# Patient Record
Sex: Female | Born: 1995 | Hispanic: No | Marital: Single | Smoking: Never smoker
Health system: Southern US, Community
[De-identification: ages and names within clinical notes are randomized; demographics above are authoritative.]

## PROBLEM LIST (undated history)

## (undated) ENCOUNTER — Ambulatory Visit

## (undated) DIAGNOSIS — R51 Headache: Secondary | ICD-10-CM

## (undated) DIAGNOSIS — N946 Dysmenorrhea, unspecified: Secondary | ICD-10-CM

## (undated) DIAGNOSIS — J45909 Unspecified asthma, uncomplicated: Secondary | ICD-10-CM

## (undated) DIAGNOSIS — J189 Pneumonia, unspecified organism: Secondary | ICD-10-CM

## (undated) DIAGNOSIS — J0391 Acute recurrent tonsillitis, unspecified: Secondary | ICD-10-CM

## (undated) DIAGNOSIS — B379 Candidiasis, unspecified: Secondary | ICD-10-CM

## (undated) DIAGNOSIS — E041 Nontoxic single thyroid nodule: Secondary | ICD-10-CM

## (undated) DIAGNOSIS — D649 Anemia, unspecified: Secondary | ICD-10-CM

## (undated) DIAGNOSIS — N760 Acute vaginitis: Secondary | ICD-10-CM

## (undated) DIAGNOSIS — R519 Headache, unspecified: Secondary | ICD-10-CM

## (undated) HISTORY — PX: WISDOM TOOTH EXTRACTION: SHX21

## (undated) HISTORY — DX: Acute recurrent tonsillitis, unspecified: J03.91

## (undated) HISTORY — DX: Headache: R51

## (undated) HISTORY — DX: Headache, unspecified: R51.9

## (undated) HISTORY — DX: Anemia, unspecified: D64.9

## (undated) HISTORY — DX: Pneumonia, unspecified organism: J18.9

## (undated) HISTORY — DX: Unspecified asthma, uncomplicated: J45.909

## (undated) HISTORY — PX: HIP SURGERY: SHX245

## (undated) HISTORY — DX: Candidiasis, unspecified: B37.9

## (undated) HISTORY — DX: Dysmenorrhea, unspecified: N94.6

---
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
## 2015-07-15 ENCOUNTER — Other Ambulatory Visit: Payer: Self-pay | Admitting: Family Medicine

## 2015-07-15 DIAGNOSIS — R51 Headache: Principal | ICD-10-CM

## 2015-07-15 DIAGNOSIS — R519 Headache, unspecified: Secondary | ICD-10-CM

## 2015-07-26 ENCOUNTER — Ambulatory Visit
Admission: RE | Admit: 2015-07-26 | Discharge: 2015-07-26 | Disposition: A | Discharge status: Home / Self Care | Payer: BC Managed Care – PPO | Source: Ambulatory Visit | Attending: Family Medicine | Admitting: Family Medicine

## 2015-07-26 DIAGNOSIS — R51 Headache: Principal | ICD-10-CM

## 2015-07-26 DIAGNOSIS — R519 Headache, unspecified: Secondary | ICD-10-CM

## 2015-08-15 ENCOUNTER — Encounter: Payer: Self-pay | Admitting: *Deleted

## 2015-08-15 ENCOUNTER — Ambulatory Visit (INDEPENDENT_AMBULATORY_CARE_PROVIDER_SITE_OTHER): Payer: BC Managed Care – PPO | Admitting: Neurology

## 2015-08-15 VITALS — BP 116/61 | HR 84 | Ht 61.0 in | Wt 127.8 lb

## 2015-08-15 DIAGNOSIS — G43111 Migraine with aura, intractable, with status migrainosus: Secondary | ICD-10-CM | POA: Diagnosis not present

## 2015-08-15 DIAGNOSIS — G43011 Migraine without aura, intractable, with status migrainosus: Secondary | ICD-10-CM | POA: Diagnosis not present

## 2015-08-15 NOTE — Progress Notes (Signed)
[facility]    Provider:  Dr Jaynee Eagles Referring Provider: [name], MD Primary Care Physician:  Lilian Coma, MD  CC:  headache  HPI:  Alisha Reynolds is a 20 y.o. female here as a referral from Dr. Stephanie Acre for headache. It has not gone away for a month, continuous. She was at home when it started, she had to sit in a dark room with a blanket over her head. She has never had migraines. She went to the ED and she got a shot in the but and flexril which didn't help. She went to see Dr. Stephanie Acre and she was give O2 and blood and a CT was completed. The Ct was normal except obstruction of the right nares which was ruled out by her ENT there was nothing on exam it may have been artifact on the CT.  The headaches are worse in the light. On avergage they are a 6/10. It can get really bad up to a 10. Sound doesn't bother her. She has nausea but no vomiting. The only thing that helped is fioricet but didn't work very well. It is on the left, throbbing and pressure. Feels better She took prednisone but not the headache for 6 days. Right she is at a 5 and the max is a 10. No vision changes, she has some tingling and numbness in the fingers. No weakness, no hearing changes, no fevers, no neck pain. She sleeps well. No snoring. She tried ibuprofen, tylenol, goodies never helped.   Her uncle on her dad's side has severe migraine.Mother has had migraines.   Reviewed notes, labs and imaging from outside physicians, which showed:  Ct showed No acute intracranial abnormalities including mass lesion or mass effect, hydrocephalus, extra-axial fluid collection, midline shift, hemorrhage, or acute infarction, large ischemic events (personally reviewed images)  Labs: pregnancy urine negative  Review of Systems: Patient complains of symptoms per HPI as well as the following symptoms: fatigue, blurred vision, eye pain, lymh nodes, headaches, numbness, birth marks. Pertinent negatives per HPI.  All others negative.   Social History   Social History  . Marital Status: Single    Spouse Name: N/A  . Number of Children: 0  . Years of Education: 13   Occupational History  .      [employer]   Social History Main Topics  . Smoking status: Never Smoker   . Smokeless tobacco: Never Used  . Alcohol Use: No  . Drug Use: No  . Sexual Activity: Yes    Birth Control/ Protection: Other-see comments, Implant   Other Topics Concern  . Not on file   Social History Narrative   Lives at home with mom, dad, younger brother   Caffeine use- 1 cup daily    Family History  Problem Relation Age of Onset  . Autism spectrum disorder      Younger half-Brother  . Cancer - Colon Maternal Grandmother   . Migraines Mother     Past Medical History  Diagnosis Date  . Asthma   . Yeast infection   . Anemia 10/30/11  . Dysmenorrhea 10/30/11  . Pneumonia 10/30/11  . Recurrent tonsillitis   . Headache     Past Surgical History  Procedure Laterality Date  . Tonsillectomy  09/28/14    Current Outpatient Prescriptions  Medication Sig Dispense Refill  . etonogestrel (NEXPLANON) 68 MG IMPL implant Inject 1 each into the skin once.     No current facility-administered medications for this visit.  Allergies as of 08/15/2015  . (No Known Allergies)    Vitals: BP 116/61 mmHg  Pulse 84  Ht 5\' 1"  (1.549 m)  Wt 127 lb 12.8 oz (57.97 kg)  BMI 24.16 kg/m2 Last Weight:  Wt Readings from Last 1 Encounters:  08/15/15 127 lb 12.8 oz (57.97 kg) (50 %*, Z = 0.01)   * Growth percentiles are based on CDC 2-20 Years data.   Last Height:   Ht Readings from Last 1 Encounters:  08/15/15 5\' 1"  (1.549 m) (10 %*, Z = -1.29)   * Growth percentiles are based on CDC 2-20 Years data.   Physical exam: Exam: Gen: NAD, conversant                   CV: RRR, no MRG. No Carotid Bruits. No peripheral edema, warm, nontender Eyes: Conjunctivae clear without exudates or hemorrhage  Neuro: Detailed  Neurologic Exam  Speech:    Speech is normal; fluent and spontaneous with normal comprehension.  Cognition:    The patient is oriented to person, place, and time;     recent and remote memory intact;     language fluent;     normal attention, concentration,     fund of knowledge Cranial Nerves:    The pupils are equal, round, and reactive to light. The fundi are normal and spontaneous venous pulsations are present. Visual fields are full to finger confrontation. Extraocular movements are intact. Trigeminal sensation is intact and the muscles of mastication are normal. The face is symmetric. The palate elevates in the midline. Hearing intact. Voice is normal. Shoulder shrug is normal. The tongue has normal motion without fasciculations.   Coordination:    Normal finger to nose and heel to shin. Normal rapid alternating movements.   Gait:    Heel-toe and tandem gait are normal.   Motor Observation:    No asymmetry, no atrophy, and no involuntary movements noted. Tone:    Normal muscle tone.    Posture:    Posture is normal. normal erect    Strength:    Strength is V/V in the upper and lower limbs.      Sensation: intact to LT     Reflex Exam:  DTR's:    Deep tendon reflexes in the upper and lower extremities are normal bilaterally.   Toes:    The toes are downgoing bilaterally.   Clonus:    Clonus is absent.       Assessment/Plan:  lovely 20 year old with what appears to be a migraine type headache continously for a month. CT of the head unremarkable. Neuro exam normal. Will order a migraine cocktail and try to improve the headache or make it entirely go away. If it persists, may need to start a preventative such as Nortriptyline. Problem list included Depression, use caution with Topamax as can make depression worse.   Sarina Ill, MD  [facility] [address] [place], [place] [address]  Phone [phone] Fax [phone]

## 2015-08-19 ENCOUNTER — Other Ambulatory Visit: Payer: Self-pay | Admitting: Neurology

## 2015-08-19 ENCOUNTER — Telehealth: Payer: Self-pay | Admitting: Neurology

## 2015-08-19 MED ORDER — TOPIRAMATE 50 MG PO TABS: 50.0000 mg | ORAL_TABLET | Freq: Every day | ORAL | Status: DC

## 2015-08-19 NOTE — Telephone Encounter (Signed)
Patient called to advise Cocktail IV she received on Thursday 08/15/15 has not worked, headaches have not gone away. Please call [phone].

## 2015-08-19 NOTE — Telephone Encounter (Signed)
Per Dr Jaynee Eagles- she wants to start pt on preventative medication. She is going to review pt chart and I will call her back once she decides what med to start her on. Call pt to relay message. Pt also stated her wisdom teeth are coming in. Two bottom ones are coming in, top ones have not yet. Is planning on having teeth removed. Wanted to take care of headaches first. She is not sure if this is correlated. Headaches started about a month ago. Her wisdom teeth just started coming in. Advised I will speak to Dr Jaynee Eagles and call her back to advise. She verbalized understanding.

## 2015-08-19 NOTE — Telephone Encounter (Signed)
Calling in topamax 50mg  at night. Start with 1/2 pill 25 then in 2 weeks can increase. Discuss side effects. Don't get pregnant. Serious side effects can inclue: Abdominal or stomach pain  loss of appetite  mood changes  weight loss Rare  ringing or buzzing in the ears  skin rash or itching  swelling  trouble breathing Common side effects of Topamax include:  tiredness,  drowsiness,  dizziness,  nervousness,  numbness or tingly feeling,  weight loss,  loss of appetite,  bad taste in your mouth,  confusion,  slowed thinking,  trouble concentrating or paying attention,  memory problems,

## 2015-08-20 NOTE — Telephone Encounter (Signed)
LVM for pt to call back regarding Dr Jaynee Eagles message below. Gave [facility] phone number and hours.

## 2015-08-21 NOTE — Telephone Encounter (Signed)
Patient is returning your call and can be reached at [phone].

## 2015-08-21 NOTE — Telephone Encounter (Signed)
Called pt back. Relayed information below per Dr Jaynee Eagles. She started medication last night. She took 1/2 tablet and knows to increase to 50mg  in a couple weeks. Told her to call me if she experiences any SE. She verbalized understanding.

## 2015-10-24 ENCOUNTER — Emergency Department (HOSPITAL_COMMUNITY)
Admission: EM | Admit: 2015-10-24 | Discharge: 2015-10-24 | Disposition: A | Discharge status: Home / Self Care | Payer: BC Managed Care – PPO | Attending: Emergency Medicine | Admitting: Emergency Medicine

## 2015-10-24 ENCOUNTER — Encounter (HOSPITAL_COMMUNITY): Payer: Self-pay | Admitting: Emergency Medicine

## 2015-10-24 DIAGNOSIS — Z8701 Personal history of pneumonia (recurrent): Secondary | ICD-10-CM | POA: Diagnosis not present

## 2015-10-24 DIAGNOSIS — Z8619 Personal history of other infectious and parasitic diseases: Secondary | ICD-10-CM | POA: Insufficient documentation

## 2015-10-24 DIAGNOSIS — Z862 Personal history of diseases of the blood and blood-forming organs and certain disorders involving the immune mechanism: Secondary | ICD-10-CM | POA: Insufficient documentation

## 2015-10-24 DIAGNOSIS — J45909 Unspecified asthma, uncomplicated: Secondary | ICD-10-CM | POA: Insufficient documentation

## 2015-10-24 DIAGNOSIS — H9202 Otalgia, left ear: Secondary | ICD-10-CM | POA: Diagnosis present

## 2015-10-24 DIAGNOSIS — H6122 Impacted cerumen, left ear: Secondary | ICD-10-CM | POA: Diagnosis not present

## 2015-10-24 DIAGNOSIS — J029 Acute pharyngitis, unspecified: Secondary | ICD-10-CM | POA: Diagnosis not present

## 2015-10-24 DIAGNOSIS — Z8742 Personal history of other diseases of the female genital tract: Secondary | ICD-10-CM | POA: Insufficient documentation

## 2015-10-24 LAB — RAPID STREP SCREEN (MED CTR MEBANE ONLY): Streptococcus, Group A Screen (Direct): NEGATIVE

## 2015-10-24 MED ORDER — IBUPROFEN 400 MG PO TABS
400.0000 mg | ORAL_TABLET | Freq: Once | ORAL | Status: AC
  Administered 2015-10-24: 400 mg via ORAL
  Filled 2015-10-24: qty 1

## 2015-10-24 MED ORDER — DEXAMETHASONE SODIUM PHOSPHATE 10 MG/ML IJ SOLN
10.0000 mg | Freq: Once | INTRAMUSCULAR | Status: AC
  Administered 2015-10-24: 10 mg via INTRAMUSCULAR
  Filled 2015-10-24: qty 1

## 2015-10-24 NOTE — Discharge Instructions (Signed)
Pharyngitis Pharyngitis is redness, pain, and swelling (inflammation) of your pharynx.  CAUSES  Pharyngitis is usually caused by infection. Most of the time, these infections are from viruses (viral) and are part of a cold. However, sometimes pharyngitis is caused by bacteria (bacterial). Pharyngitis can also be caused by allergies. Viral pharyngitis may be spread from person to person by coughing, sneezing, and personal items or utensils (cups, forks, spoons, toothbrushes). Bacterial pharyngitis may be spread from person to person by more intimate contact, such as kissing.  SIGNS AND SYMPTOMS  Symptoms of pharyngitis include:   Sore throat.   Tiredness (fatigue).   Low-grade fever.   Headache.  Joint pain and muscle aches.  Skin rashes.  Swollen lymph nodes.  Plaque-like film on throat or tonsils (often seen with bacterial pharyngitis). DIAGNOSIS  Your health care provider will ask you questions about your illness and your symptoms. Your medical history, along with a physical exam, is often all that is needed to diagnose pharyngitis. Sometimes, a rapid strep test is done. Other lab tests may also be done, depending on the suspected cause.  TREATMENT  Viral pharyngitis will usually get better in 3-4 days without the use of medicine. Bacterial pharyngitis is treated with medicines that kill germs (antibiotics).  HOME CARE INSTRUCTIONS   Drink enough water and fluids to keep your urine clear or pale yellow.   Only take over-the-counter or prescription medicines as directed by your health care provider:   If you are prescribed antibiotics, make sure you finish them even if you start to feel better.   Do not take aspirin.   Get lots of rest.   Gargle with 8 oz of salt water ( tsp of salt per 1 qt of water) as often as every 1-2 hours to soothe your throat.   Throat lozenges (if you are not at risk for choking) or sprays may be used to soothe your throat. SEEK MEDICAL  CARE IF:   You have large, tender lumps in your neck.  You have a rash.  You cough up green, yellow-brown, or bloody spit. SEEK IMMEDIATE MEDICAL CARE IF:   Your neck becomes stiff.  You drool or are unable to swallow liquids.  You vomit or are unable to keep medicines or liquids down.  You have severe pain that does not go away with the use of recommended medicines.  You have trouble breathing (not caused by a stuffy nose). MAKE SURE YOU:   Understand these instructions.  Will watch your condition.  Will get help right away if you are not doing well or get worse.   This information is not intended to replace advice given to you by your health care provider. Make sure you discuss any questions you have with your health care provider.   Document Released: 07/13/2005 Document Revised: 05/03/2013 Document Reviewed: 03/20/2013 Elsevier Interactive Patient Education 2016 [employer] Impaction The structures of the external ear canal secrete a waxy substance known as cerumen. Excess cerumen can build up in the ear canal, causing a condition known as cerumen impaction. Cerumen impaction can cause ear pain and disrupt the function of the ear. The rate of cerumen production differs for each individual. In certain individuals, the configuration of the ear canal may decrease his or her ability to naturally remove cerumen. CAUSES Cerumen impaction is caused by excessive cerumen production or buildup. RISK FACTORS  Frequent use of swabs to clean ears.  Having narrow ear canals.  Having eczema.  Being dehydrated.  SIGNS AND SYMPTOMS  Diminished hearing.  Ear drainage.  Ear pain.  Ear itch. TREATMENT Treatment may involve:  Over-the-counter or prescription ear drops to soften the cerumen.  Removal of cerumen by a health care provider. This may be done with:  Irrigation with warm water. This is the most common method of removal.  Ear curettes and other  instruments.  Surgery. This may be done in severe cases. HOME CARE INSTRUCTIONS  Take medicines only as directed by your health care provider.  Do not insert objects into the ear with the intent of cleaning the ear. PREVENTION  Do not insert objects into the ear, even with the intent of cleaning the ear. Removing cerumen as a part of normal hygiene is not necessary, and the use of swabs in the ear canal is not recommended.  Drink enough water to keep your urine clear or pale yellow.  Control your eczema if you have it. SEEK MEDICAL CARE IF:  You develop ear pain.  You develop bleeding from the ear.  The cerumen does not clear after you use ear drops as directed.   This information is not intended to replace advice given to you by your health care provider. Make sure you discuss any questions you have with your health care provider.   Document Released: 08/20/2004 Document Revised: 08/03/2014 Document Reviewed: 02/27/2015 Elsevier Interactive Patient Education [employer].

## 2015-10-24 NOTE — ED Notes (Signed)
Pt reports "Two days ago I was eating a salad and saw a dead fly in it. I have had a sore throat and ear aches since then."

## 2015-10-24 NOTE — ED Provider Notes (Signed)
CSN: [id number]     Arrival date & time 10/24/15  2110 History  By signing my name below, I, Alisha Reynolds, attest that this documentation has been prepared under the direction and in the presence of Alisha Quill, NP. Electronically Signed: Eustaquio Reynolds, ED Scribe. 10/24/2015. 9:46 PM.   Chief Complaint  Patient presents with  . Sore Throat  . Otalgia   Patient is a 20 y.o. female presenting with pharyngitis and ear pain. The history is provided by the patient. No language interpreter was used.  Sore Throat This is a new problem. The current episode started more than 2 days ago. The problem has not changed since onset.Pertinent negatives include no chest pain, no abdominal pain, no headaches and no shortness of breath. The symptoms are aggravated by swallowing, eating and drinking. Nothing relieves the symptoms. She has tried nothing for the symptoms. The treatment provided no relief.  Otalgia Associated symptoms: cough and fever (subjective)   Associated symptoms: no abdominal pain, no headaches and no vomiting      HPI Comments: Alisha Reynolds is a 20 y.o. female who presents to the Emergency Department complaining of gradual onset, constant, sore throat x 5 days. The pain is exacerbated with swallowing and talking. Pt also complains of subjective fever, body aches, cough, and left ear pain. Denies abdominal pain, nausea, vomiting, or any other associated symptoms.   Past Medical History  Diagnosis Date  . Asthma   . Yeast infection   . Anemia 10/30/11  . Dysmenorrhea 10/30/11  . Pneumonia 10/30/11  . Recurrent tonsillitis   . Headache    Past Surgical History  Procedure Laterality Date  . Tonsillectomy  09/28/14   Family History  Problem Relation Age of Onset  . Autism spectrum disorder      Younger half-Brother  . Cancer - Colon Maternal Grandmother   . Migraines Mother    Social History  Substance Use Topics  . Smoking status: Never Smoker   . Smokeless tobacco: Never Used   . Alcohol Use: No   OB History    No data available     Review of Systems  Constitutional: Positive for fever (subjective).  HENT: Positive for ear pain (left).   Respiratory: Positive for cough. Negative for shortness of breath.   Cardiovascular: Negative for chest pain.  Gastrointestinal: Negative for nausea, vomiting and abdominal pain.  Musculoskeletal: Positive for myalgias.  Neurological: Negative for headaches.  All other systems reviewed and are negative.  Allergies  Review of patient's allergies indicates no known allergies.  Home Medications   Prior to Admission medications   Medication Sig Start Date End Date Taking? Authorizing Provider  etonogestrel (NEXPLANON) 68 MG IMPL implant Inject 1 each into the skin once.    Historical Provider, MD  topiramate (TOPAMAX) 50 MG tablet Take 1 tablet (50 mg total) by mouth at bedtime. Start with 1/2 pill then increase to a whole pill in 2 weeks. 08/19/15   Melvenia Beam, MD   BP 147/76 mmHg  Pulse 105  Temp(Src) 98.8 F (37.1 C) (Oral)  Resp 16  Ht 5\' 1"  (1.549 m)  Wt 130 lb (58.968 kg)  BMI 24.58 kg/m2  SpO2 100%   Physical Exam  Constitutional: She is oriented to person, place, and time. She appears well-developed and well-nourished. No distress.  HENT:  Head: Normocephalic and atraumatic.  Right Ear: Tympanic membrane normal.  Mouth/Throat: Posterior oropharyngeal erythema present. No oropharyngeal exudate.  Cerumen impaction to left ear.  Eyes: Conjunctivae and EOM are normal.  Neck: Neck supple. No tracheal deviation present.  Cardiovascular: Normal rate.   Pulmonary/Chest: Effort normal. No respiratory distress.  Musculoskeletal: Normal range of motion.  Neurological: She is alert and oriented to person, place, and time.  Skin: Skin is warm and dry.  Psychiatric: She has a normal mood and affect. Her behavior is normal.  Nursing note and vitals reviewed.   ED Course  Procedures (including critical care  time)  DIAGNOSTIC STUDIES: Oxygen Saturation is 100% on RA, normal by my interpretation.    COORDINATION OF CARE: 9:46 PM-Discussed treatment plan which includes rapid strep test with pt at bedside and pt agreed to plan.   Labs Review Labs Reviewed  RAPID STREP SCREEN (NOT AT [facility])  CULTURE, GROUP A STREP [facility])    Imaging Review No results found. I have personally reviewed and evaluated these lab results as part of my medical decision-making.   EKG Interpretation None     Negative strep screen. MDM   Final diagnoses:  None  Pt with negative strep. Diagnosis of viral pharyngitis. No abx indicated at this time. Discussed that results of strep culture are pending and patient will be informed if positive result and abx will be called in at that time. Discharge with symptomatic tx. No evidence of dehydration. Pt is tolerating secretions. Presentation not concerning for peritonsillar abscess or spread of infection to deep spaces of the throat; patent airway. Specific return precautions discussed. Recommended PCP follow up. Pt appears safe for discharge.  I personally performed the services described in this documentation, which was scribed in my presence. The recorded information has been reviewed and is accurate.       Alisha Quill, NP 10/24/15 [place], MD 10/25/15 [phone]

## 2015-10-27 LAB — CULTURE, GROUP A STREP (THRC)

## 2016-01-29 ENCOUNTER — Encounter (HOSPITAL_COMMUNITY): Payer: Self-pay | Admitting: *Deleted

## 2016-01-29 ENCOUNTER — Inpatient Hospital Stay (HOSPITAL_COMMUNITY)
Admission: AD | Admit: 2016-01-29 | Discharge: 2016-01-30 | Disposition: A | Discharge status: Home / Self Care | Payer: BC Managed Care – PPO | Source: Ambulatory Visit | Attending: Obstetrics and Gynecology | Admitting: Obstetrics and Gynecology

## 2016-01-29 DIAGNOSIS — F411 Generalized anxiety disorder: Secondary | ICD-10-CM | POA: Insufficient documentation

## 2016-01-29 DIAGNOSIS — Z810 Family history of intellectual disabilities: Secondary | ICD-10-CM | POA: Insufficient documentation

## 2016-01-29 DIAGNOSIS — N939 Abnormal uterine and vaginal bleeding, unspecified: Secondary | ICD-10-CM | POA: Insufficient documentation

## 2016-01-29 DIAGNOSIS — J45909 Unspecified asthma, uncomplicated: Secondary | ICD-10-CM | POA: Insufficient documentation

## 2016-01-29 DIAGNOSIS — D649 Anemia, unspecified: Secondary | ICD-10-CM | POA: Diagnosis not present

## 2016-01-29 DIAGNOSIS — R109 Unspecified abdominal pain: Secondary | ICD-10-CM | POA: Insufficient documentation

## 2016-01-29 DIAGNOSIS — Z800 Family history of malignant neoplasm of digestive organs: Secondary | ICD-10-CM | POA: Diagnosis not present

## 2016-01-29 DIAGNOSIS — G43011 Migraine without aura, intractable, with status migrainosus: Secondary | ICD-10-CM | POA: Insufficient documentation

## 2016-01-29 DIAGNOSIS — Z820 Family history of epilepsy and other diseases of the nervous system: Secondary | ICD-10-CM | POA: Insufficient documentation

## 2016-01-29 DIAGNOSIS — Z3202 Encounter for pregnancy test, result negative: Secondary | ICD-10-CM | POA: Diagnosis not present

## 2016-01-29 DIAGNOSIS — N946 Dysmenorrhea, unspecified: Secondary | ICD-10-CM | POA: Insufficient documentation

## 2016-01-29 DIAGNOSIS — F329 Major depressive disorder, single episode, unspecified: Secondary | ICD-10-CM | POA: Insufficient documentation

## 2016-01-29 LAB — URINALYSIS, ROUTINE W REFLEX MICROSCOPIC
Bilirubin Urine: NEGATIVE
Glucose, UA: NEGATIVE mg/dL
Ketones, ur: NEGATIVE mg/dL
Leukocytes, UA: NEGATIVE
Nitrite: NEGATIVE
Protein, ur: NEGATIVE mg/dL
Specific Gravity, Urine: 1.025 (ref 1.005–1.030)
pH: 6 (ref 5.0–8.0)

## 2016-01-29 LAB — URINE MICROSCOPIC-ADD ON

## 2016-01-29 LAB — POCT PREGNANCY, URINE: Preg Test, Ur: NEGATIVE

## 2016-01-29 NOTE — MAU Provider Note (Signed)
History   20 yo P0 presented unannounced c/o faintly positive UPT 2 weeks ago, then onset of vaginal bleeding tonight, with mild cramping, "like a period".  Had Nexplanon removed 11/2015 after 2 years of use due to pain/numbness in arm (sx resolved after removal).  Was given OCP Rx at that time, which patient took intermittently "for awhile".  Does not want to get pregnant--planning to go to nursing school, and partner also in college at [employer].    No cycle on Nexplanon, and no bleeding since removal of implant.  Hx of migraines, with Topamax use this year.  Was instructed not to get pregnant while on that medication.  Patient Active Problem List   Diagnosis Date Noted  . Asthma 01/29/2016  . Intractable migraine without aura and with status migrainosus 08/15/2015  . MDD (major depressive disorder), single episode, severe ([place]) 12/01/2013  . Generalized anxiety disorder 11/30/2013    Chief Complaint  Patient presents with  . Vaginal Bleeding   HPI:  See above  OB History    No data available      Past Medical History  Diagnosis Date  . Asthma   . Yeast infection   . Anemia 10/30/11  . Dysmenorrhea 10/30/11  . Pneumonia 10/30/11  . Recurrent tonsillitis   . Headache     Past Surgical History  Procedure Laterality Date  . Tonsillectomy  09/28/14  . Wisdom tooth extraction      Family History  Problem Relation Age of Onset  . Autism spectrum disorder      Younger half-Brother  . Cancer - Colon Maternal Grandmother   . Migraines Mother     Social History  Substance Use Topics  . Smoking status: Never Smoker   . Smokeless tobacco: Never Used  . Alcohol Use: No    Allergies: No Known Allergies  Prescriptions prior to admission  Medication Sig Dispense Refill Last Dose  . etonogestrel (NEXPLANON) 68 MG IMPL implant Inject 1 each into the skin once.   Taking  . topiramate (TOPAMAX) 50 MG tablet Take 1 tablet (50 mg total) by mouth at bedtime. Start with 1/2  pill then increase to a whole pill in 2 weeks. 30 tablet 12 Unknown at Unknown time    ROS: Small amount menstrual-type bleeding, mild cramping Physical Exam   Blood pressure 132/65, pulse 71, temperature 98.7 F (37.1 C), temperature source Oral, resp. rate 16, height 5\' 1"  (1.549 m), weight 61.689 kg (136 lb), SpO2 100 %.   Physical Exam In NAD Chest clear Heart RRR without murmur Abd soft, NT Pelvic--small amount menstrual-type bleeding, cervix closed, long, NT.  Adnexa NT, no masses palpated, uterus small/NT. Ext WNL   UPT negative ED Course  Assessment: ? Early SAB  Plan: QHCG, ABO/Rh If negative, recommend patient restart OCP given at annual exam 11/2015. If positive, will repeat as indicated.   Donnel Saxon CNM, MSN 01/29/2016 11:30 PM   Addendum: Results for orders placed or performed during the hospital encounter of 01/29/16 (from the past 24 hour(s))  Urinalysis, Routine w reflex microscopic (not at [facility])     Status: Abnormal   Collection Time: 01/29/16  9:30 PM  Result Value Ref Range   Color, Urine YELLOW YELLOW   APPearance CLEAR CLEAR   Specific Gravity, Urine 1.025 1.005 - 1.030   pH 6.0 5.0 - 8.0   Glucose, UA NEGATIVE NEGATIVE mg/dL   Hgb urine dipstick MODERATE (A) NEGATIVE   Bilirubin Urine NEGATIVE NEGATIVE  Ketones, ur NEGATIVE NEGATIVE mg/dL   Protein, ur NEGATIVE NEGATIVE mg/dL   Nitrite NEGATIVE NEGATIVE   Leukocytes, UA NEGATIVE NEGATIVE  Urine microscopic-add on     Status: Abnormal   Collection Time: 01/29/16  9:30 PM  Result Value Ref Range   Squamous Epithelial / LPF 0-5 (A) NONE SEEN   WBC, UA 0-5 0 - 5 WBC/hpf   RBC / HPF 0-5 0 - 5 RBC/hpf   Bacteria, UA FEW (A) NONE SEEN  Pregnancy, urine POC     Status: None   Collection Time: 01/29/16 10:08 PM  Result Value Ref Range   Preg Test, Ur NEGATIVE NEGATIVE  ABO/Rh     Status: None (Preliminary result)   Collection Time: 01/29/16 10:44 PM  Result Value Ref Range   ABO/RH(D) A  POS   hCG, quantitative, pregnancy     Status: None   Collection Time: 01/29/16 10:44 PM  Result Value Ref Range   hCG, Beta Chain, Quant, S <1 <5 mIU/mL   Impression: Likely early SAB, now resolved  Plan: D/c home with instructions to start OCPs this weekend. F/u as needed, as scheduled.  Donnel Saxon, CNM 01/30/16 0100

## 2016-01-29 NOTE — MAU Note (Signed)
Pt presents with complaint of positive home preg test 2 weeks ago and today she began having bleeding and cramping.

## 2016-01-30 DIAGNOSIS — F419 Anxiety disorder, unspecified: Secondary | ICD-10-CM | POA: Insufficient documentation

## 2016-01-30 LAB — ABO/RH: ABO/RH(D): A POS

## 2016-01-30 LAB — HCG, QUANTITATIVE, PREGNANCY: hCG, Beta Chain, Quant, S: <1 m[IU]/mL (ref <5)

## 2016-01-30 NOTE — Discharge Instructions (Signed)
Start birth control pills on Sunday--will take at least 3 weeks for complete protection to occur.

## 2016-12-03 ENCOUNTER — Encounter (HOSPITAL_COMMUNITY): Payer: Self-pay | Admitting: Emergency Medicine

## 2016-12-03 ENCOUNTER — Emergency Department (HOSPITAL_COMMUNITY): Payer: BC Managed Care – PPO

## 2016-12-03 ENCOUNTER — Encounter (HOSPITAL_COMMUNITY): Payer: Self-pay | Admitting: Nurse Practitioner

## 2016-12-03 ENCOUNTER — Emergency Department (HOSPITAL_COMMUNITY)
Admission: EM | Admit: 2016-12-03 | Discharge: 2016-12-03 | Disposition: A | Discharge status: Home / Self Care | Payer: BC Managed Care – PPO | Source: Home / Self Care

## 2016-12-03 ENCOUNTER — Emergency Department (HOSPITAL_COMMUNITY)
Admission: EM | Admit: 2016-12-03 | Discharge: 2016-12-04 | Disposition: A | Discharge status: Home / Self Care | Payer: BC Managed Care – PPO | Attending: Emergency Medicine | Admitting: Emergency Medicine

## 2016-12-03 DIAGNOSIS — Y939 Activity, unspecified: Secondary | ICD-10-CM | POA: Insufficient documentation

## 2016-12-03 DIAGNOSIS — R930 Abnormal findings on diagnostic imaging of skull and head, not elsewhere classified: Secondary | ICD-10-CM | POA: Insufficient documentation

## 2016-12-03 DIAGNOSIS — S299XXA Unspecified injury of thorax, initial encounter: Secondary | ICD-10-CM | POA: Diagnosis present

## 2016-12-03 DIAGNOSIS — J45909 Unspecified asthma, uncomplicated: Secondary | ICD-10-CM | POA: Diagnosis not present

## 2016-12-03 DIAGNOSIS — S29012A Strain of muscle and tendon of back wall of thorax, initial encounter: Secondary | ICD-10-CM | POA: Diagnosis not present

## 2016-12-03 DIAGNOSIS — S0990XA Unspecified injury of head, initial encounter: Secondary | ICD-10-CM | POA: Insufficient documentation

## 2016-12-03 DIAGNOSIS — Y92410 Unspecified street and highway as the place of occurrence of the external cause: Secondary | ICD-10-CM

## 2016-12-03 DIAGNOSIS — Z5321 Procedure and treatment not carried out due to patient leaving prior to being seen by health care provider: Secondary | ICD-10-CM

## 2016-12-03 DIAGNOSIS — Y999 Unspecified external cause status: Secondary | ICD-10-CM

## 2016-12-03 DIAGNOSIS — S29019A Strain of muscle and tendon of unspecified wall of thorax, initial encounter: Secondary | ICD-10-CM

## 2016-12-03 MED ORDER — IBUPROFEN 200 MG PO TABS
400.0000 mg | ORAL_TABLET | Freq: Once | ORAL | Status: AC
  Administered 2016-12-04: 400 mg via ORAL
  Filled 2016-12-03: qty 2

## 2016-12-03 NOTE — ED Notes (Signed)
[facility] registration notified [facility] ED Charge RN that this patient is now at their hospital
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
## 2016-12-04 LAB — I-STAT BETA HCG BLOOD, ED (MC, WL, AP ONLY): I-stat hCG, quantitative: <5 m[IU]/mL (ref <5)

## 2016-12-04 MED ORDER — NAPROXEN 500 MG PO TABS: 500.0000 mg | ORAL_TABLET | Freq: Two times a day (BID) | ORAL | 0 refills | Status: DC

## 2016-12-04 NOTE — Discharge Instructions (Signed)
Apply ice to sore areas several times a day.

## 2016-12-04 NOTE — ED Notes (Signed)
Patient verbalizes understanding of discharge instructions, prescriptions, home care and follow up care. Patient out of department at this time. 

## 2017-01-08 DIAGNOSIS — Z791 Long term (current) use of non-steroidal anti-inflammatories (NSAID): Secondary | ICD-10-CM | POA: Insufficient documentation

## 2017-01-08 DIAGNOSIS — J45909 Unspecified asthma, uncomplicated: Secondary | ICD-10-CM | POA: Insufficient documentation

## 2017-01-08 DIAGNOSIS — R51 Headache: Secondary | ICD-10-CM | POA: Diagnosis not present

## 2017-01-09 ENCOUNTER — Encounter (HOSPITAL_COMMUNITY): Payer: Self-pay | Admitting: Nurse Practitioner

## 2017-01-09 ENCOUNTER — Emergency Department (HOSPITAL_COMMUNITY)
Admission: EM | Admit: 2017-01-09 | Discharge: 2017-01-09 | Disposition: A | Discharge status: Home / Self Care | Payer: BC Managed Care – PPO | Attending: Emergency Medicine | Admitting: Emergency Medicine

## 2017-01-09 DIAGNOSIS — R51 Headache: Secondary | ICD-10-CM

## 2017-01-09 DIAGNOSIS — R519 Headache, unspecified: Secondary | ICD-10-CM

## 2017-01-09 MED ORDER — IBUPROFEN 800 MG PO TABS
800.0000 mg | ORAL_TABLET | Freq: Once | ORAL | Status: AC
  Administered 2017-01-09: 800 mg via ORAL
  Filled 2017-01-09: qty 1

## 2017-01-09 MED ORDER — DIAZEPAM 5 MG PO TABS
5.0000 mg | ORAL_TABLET | Freq: Once | ORAL | Status: AC
  Administered 2017-01-09: 5 mg via ORAL
  Filled 2017-01-09: qty 1

## 2017-01-09 MED ORDER — ACETAMINOPHEN 500 MG PO TABS
1000.0000 mg | ORAL_TABLET | Freq: Once | ORAL | Status: AC
  Administered 2017-01-09: 1000 mg via ORAL
  Filled 2017-01-09: qty 2

## 2017-01-09 NOTE — Discharge Instructions (Signed)
Take 4 over the counter ibuprofen tablets 3 times a day or 2 over-the-counter naproxen tablets twice a day for pain. Also take tylenol 1000mg(2 extra strength) four times a day.    

## 2017-01-09 NOTE — ED Triage Notes (Signed)
Pt is c/o right sided temporal region head pain hat is localized on her right ear and radiates to her neck. Her ear pinna is somewhat inflamed ans she adds that has been ongoing for the last 2 days wit nausea.
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
## 2017-01-16 ENCOUNTER — Inpatient Hospital Stay (HOSPITAL_COMMUNITY)
Admission: AD | Admit: 2017-01-16 | Discharge: 2017-01-16 | Disposition: A | Discharge status: Home / Self Care | Payer: BC Managed Care – PPO | Source: Ambulatory Visit | Attending: Obstetrics and Gynecology | Admitting: Obstetrics and Gynecology

## 2017-01-16 ENCOUNTER — Encounter (HOSPITAL_COMMUNITY): Payer: Self-pay | Admitting: *Deleted

## 2017-01-16 DIAGNOSIS — N898 Other specified noninflammatory disorders of vagina: Secondary | ICD-10-CM | POA: Insufficient documentation

## 2017-01-16 DIAGNOSIS — Z820 Family history of epilepsy and other diseases of the nervous system: Secondary | ICD-10-CM | POA: Diagnosis not present

## 2017-01-16 DIAGNOSIS — N760 Acute vaginitis: Secondary | ICD-10-CM

## 2017-01-16 DIAGNOSIS — B9689 Other specified bacterial agents as the cause of diseases classified elsewhere: Secondary | ICD-10-CM

## 2017-01-16 LAB — URINALYSIS, ROUTINE W REFLEX MICROSCOPIC
Bilirubin Urine: NEGATIVE
Glucose, UA: NEGATIVE mg/dL
Hgb urine dipstick: NEGATIVE
Ketones, ur: NEGATIVE mg/dL
Leukocytes, UA: NEGATIVE
Nitrite: NEGATIVE
Protein, ur: NEGATIVE mg/dL
Specific Gravity, Urine: 1.011 (ref 1.005–1.030)
pH: 7 (ref 5.0–8.0)

## 2017-01-16 LAB — WET PREP, GENITAL
Sperm: NONE SEEN
Trich, Wet Prep: NONE SEEN
Yeast Wet Prep HPF POC: NONE SEEN

## 2017-01-16 LAB — POCT PREGNANCY, URINE: Preg Test, Ur: NEGATIVE

## 2017-01-16 MED ORDER — METRONIDAZOLE 500 MG PO TABS: 500.0000 mg | ORAL_TABLET | Freq: Two times a day (BID) | ORAL | 0 refills | Status: DC

## 2017-01-16 NOTE — MAU Note (Signed)
VAginal d/c for 2 days. Milky white with odor. No pain

## 2017-01-16 NOTE — [facility] Provider Note (Signed)
History    Alisha Reynolds is a 20y.o. Female who presents to [facility]U, unannounced, with c/o foul smelling vaginal discharge.  Patient states symptoms started 2 days ago with thin white discharge and feelings of vaginal pressure.  Patient reports smell, but is unable to describe other than it is not normal.  Patient reports being in a long-term monogamous heterosexual relationship with no usage of condoms or birth control, but no desire for pregnancy.  Patient reports regular menses and denies N/V, constipation, diarrhea, or problems with urination.     Patient Active Problem List   Diagnosis Date Noted  . Asthma 01/29/2016  . Intractable migraine without aura and with status migrainosus 08/15/2015  . MDD (major depressive disorder), single episode, severe ([place]) 12/01/2013  . Generalized anxiety disorder 11/30/2013    Chief Complaint  Patient presents with  . Vaginal Discharge   HPI  OB History    No data available      Past Medical History:  Diagnosis Date  . Anemia 10/30/11  . Asthma   . Dysmenorrhea 10/30/11  . Headache   . Pneumonia 10/30/11  . Recurrent tonsillitis   . Yeast infection     Past Surgical History:  Procedure Laterality Date  . TONSILLECTOMY  09/28/14  . WISDOM TOOTH EXTRACTION      Family History  Problem Relation Age of Onset  . Cancer - Colon Maternal Grandmother   . Migraines Mother   . Autism spectrum disorder Unknown        Younger half-Brother    Social History  Substance Use Topics  . Smoking status: Never Smoker  . Smokeless tobacco: Never Used  . Alcohol use No    Allergies: No Known Allergies  Prescriptions Prior to Admission  Medication Sig Dispense Refill Last Dose  . naproxen (NAPROSYN) 500 MG tablet Take 1 tablet (500 mg total) by mouth 2 (two) times daily. (Patient taking differently: Take 500 mg by mouth 2 (two) times daily as needed for mild pain or moderate pain. ) 30 tablet 0 01/08/2017 at 1000    ROS  See HPI Above Physical Exam    Blood pressure 137/79, pulse 95, temperature 98.3 F (36.8 C), resp. rate 16, height 5\' 1"  (1.549 m), weight 61.2 kg (135 lb), last menstrual period 12/18/2016.  Results for orders placed or performed during the hospital encounter of 01/16/17 (from the past 24 hour(s))  Urinalysis, Routine w reflex microscopic     Status: Abnormal   Collection Time: 01/16/17 12:15 AM  Result Value Ref Range   Color, Urine STRAW (A) YELLOW   APPearance CLEAR CLEAR   Specific Gravity, Urine 1.011 1.005 - 1.030   pH 7.0 5.0 - 8.0   Glucose, UA NEGATIVE NEGATIVE mg/dL   Hgb urine dipstick NEGATIVE NEGATIVE   Bilirubin Urine NEGATIVE NEGATIVE   Ketones, ur NEGATIVE NEGATIVE mg/dL   Protein, ur NEGATIVE NEGATIVE mg/dL   Nitrite NEGATIVE NEGATIVE   Leukocytes, UA NEGATIVE NEGATIVE  Pregnancy, urine POC     Status: None   Collection Time: 01/16/17 12:45 AM  Result Value Ref Range   Preg Test, Ur NEGATIVE NEGATIVE  Wet prep, genital     Status: Abnormal   Collection Time: 01/16/17 12:55 AM  Result Value Ref Range   Yeast Wet Prep HPF POC NONE SEEN NONE SEEN   Trich, Wet Prep NONE SEEN NONE SEEN   Clue Cells Wet Prep HPF POC PRESENT (A) NONE SEEN   WBC, Wet Prep HPF POC MODERATE (  A) NONE SEEN   Sperm NONE SEEN     Physical Exam  Constitutional: She is oriented to person, place, and time. She appears well-developed and well-nourished. No distress.  HENT:  Head: Normocephalic and atraumatic.  Eyes: Conjunctivae are normal.  Neck: Normal range of motion.  Cardiovascular: Normal rate, regular rhythm and normal heart sounds.   Respiratory: Effort normal and breath sounds normal.  GI: Soft. Bowel sounds are normal.  Genitourinary: Rectum normal and uterus normal. Cervix exhibits no motion tenderness and no discharge. Right adnexum displays no tenderness. Left adnexum displays no tenderness. No tenderness or bleeding in the vagina. Vaginal discharge found.  Genitourinary Comments:  Speculum  Exam: -Vaginal Vault: Scant amt thin white discharge -wet prep collected -Cervix:No discharge from os-GC/CT collected -Bimanual Exam: Closed   Musculoskeletal: Normal range of motion. She exhibits no edema.  Neurological: She is alert and oriented to person, place, and time.  Skin: Skin is warm and dry.     ED Course  Assessment: 20y.o. Female Vaginal Discharge  Plan: -Labs: UA, Wet Prep, GC/CT --Discussed ways to prevent bacterial vaginosis:  +Wear cotton underwear +Use low scent or scent free soaps and detergents +No douching -Await results  Follow Up (0130) -Wet Prep w/ Clue Cells -Rx for Flagyl 500mg  BID x 7 days -Discussed no ETOH consumption while on medication -No sexual activity during treatment -F/U in office as appropriate -Encouraged to call if any questions or concerns arise prior to next scheduled office visit.  -Discharged to home in stable condition  [place], MSN 01/16/2017 12:45 AM

## 2017-01-16 NOTE — Progress Notes (Signed)
Written and verbal d/c instructions given and understanding voiced. 

## 2017-01-16 NOTE — Discharge Instructions (Signed)

## 2017-01-18 LAB — GC/CHLAMYDIA PROBE AMP (~~LOC~~) NOT AT ARMC
Chlamydia: NEGATIVE
Neisseria Gonorrhea: NEGATIVE

## 2017-01-28 ENCOUNTER — Ambulatory Visit
Admission: RE | Admit: 2017-01-28 | Discharge: 2017-01-28 | Disposition: A | Discharge status: Home / Self Care | Payer: BC Managed Care – PPO | Source: Ambulatory Visit | Attending: Family Medicine | Admitting: Family Medicine

## 2017-01-28 ENCOUNTER — Other Ambulatory Visit: Payer: Self-pay | Admitting: Family Medicine

## 2017-01-28 DIAGNOSIS — R52 Pain, unspecified: Secondary | ICD-10-CM

## 2017-01-28 DIAGNOSIS — M542 Cervicalgia: Secondary | ICD-10-CM

## 2017-03-02 ENCOUNTER — Encounter: Payer: Self-pay | Admitting: Neurology

## 2017-03-26 ENCOUNTER — Encounter: Payer: Self-pay | Admitting: Neurology

## 2017-03-26 ENCOUNTER — Ambulatory Visit (INDEPENDENT_AMBULATORY_CARE_PROVIDER_SITE_OTHER): Payer: BC Managed Care – PPO | Admitting: Neurology

## 2017-03-26 VITALS — BP 116/76 | HR 92 | Ht 61.0 in | Wt 136.1 lb

## 2017-03-26 DIAGNOSIS — M5416 Radiculopathy, lumbar region: Secondary | ICD-10-CM

## 2017-03-26 DIAGNOSIS — M542 Cervicalgia: Secondary | ICD-10-CM
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
## 2017-04-28 ENCOUNTER — Ambulatory Visit: Payer: Self-pay | Admitting: Family Medicine

## 2017-04-29 ENCOUNTER — Institutional Professional Consult (permissible substitution): Payer: BC Managed Care – PPO | Admitting: Neurology

## 2017-05-12 ENCOUNTER — Ambulatory Visit (INDEPENDENT_AMBULATORY_CARE_PROVIDER_SITE_OTHER): Payer: BC Managed Care – PPO | Admitting: Family Medicine

## 2017-05-12 ENCOUNTER — Encounter: Payer: Self-pay | Admitting: Family Medicine

## 2017-05-12 ENCOUNTER — Other Ambulatory Visit (INDEPENDENT_AMBULATORY_CARE_PROVIDER_SITE_OTHER): Payer: BC Managed Care – PPO

## 2017-05-12 VITALS — BP 120/72 | HR 99 | Ht 61.0 in | Wt 134.0 lb

## 2017-05-12 DIAGNOSIS — G43011 Migraine without aura, intractable, with status migrainosus: Secondary | ICD-10-CM

## 2017-05-12 DIAGNOSIS — M2550 Pain in unspecified joint: Secondary | ICD-10-CM | POA: Diagnosis not present

## 2017-05-12 DIAGNOSIS — M999 Biomechanical lesion, unspecified: Secondary | ICD-10-CM | POA: Diagnosis not present

## 2017-05-12 DIAGNOSIS — E049 Nontoxic goiter, unspecified: Secondary | ICD-10-CM | POA: Diagnosis not present

## 2017-05-12 LAB — CBC WITH DIFFERENTIAL/PLATELET
Basophils Absolute: 0 10*3/uL (ref 0.0–0.1)
Basophils Relative: 0.4 % (ref 0.0–3.0)
Eosinophils Absolute: 0.7 10*3/uL (ref 0.0–0.7)
Eosinophils Relative: 12 % — ABNORMAL HIGH (ref 0.0–5.0)
HCT: 38.6 % (ref 36.0–46.0)
Hemoglobin: 12.5 g/dL (ref 12.0–15.0)
Lymphocytes Relative: 22.7 % (ref 12.0–46.0)
Lymphs Abs: 1.2 10*3/uL (ref 0.7–4.0)
MCHC: 32.5 g/dL (ref 30.0–36.0)
MCV: 83.9 fl (ref 78.0–100.0)
Monocytes Absolute: 0.4 10*3/uL (ref 0.1–1.0)
Monocytes Relative: 7.3 % (ref 3.0–12.0)
Neutro Abs: 3.1 10*3/uL (ref 1.4–7.7)
Neutrophils Relative %: 57.6 % (ref 43.0–77.0)
Platelets: 295 10*3/uL (ref 150.0–400.0)
RBC: 4.6 Mil/uL (ref 3.87–5.11)
RDW: 13.7 % (ref 11.5–15.5)
WBC: 5.4 10*3/uL (ref 4.0–10.5)

## 2017-05-12 LAB — TSH: TSH: 0.88 u[IU]/mL (ref 0.35–4.50)

## 2017-05-12 LAB — IBC PANEL
Iron: 62 ug/dL (ref 42–145)
Saturation Ratios: 12.7 % — ABNORMAL LOW (ref 20.0–50.0)
Transferrin: 350 mg/dL (ref 212.0–360.0)

## 2017-05-12 LAB — T4, FREE: Free T4: 0.88 ng/dL (ref 0.60–1.60)

## 2017-05-12 LAB — VITAMIN D 25 HYDROXY (VIT D DEFICIENCY, FRACTURES): VITD: 14.27 ng/mL — ABNORMAL LOW (ref 30.00–100.00)

## 2017-05-12 LAB — T3, FREE: T3, Free: 3.9 pg/mL (ref 2.3–4.2)

## 2017-05-12 LAB — SEDIMENTATION RATE: Sed Rate: 14 mm/hr (ref 0–20)

## 2017-05-12 NOTE — Assessment & Plan Note (Signed)
I believe the migraines can be cause by other systemic and would like to get labs to insure with patient having enlarged thyroid does not contribute in. Did respond extremity well to osteopathic manipulation. Given home exercises and work with Product/process development scientist. We discussed icing regimen. Over-the-counter medications. Hopefully patient will continue to do well overall. Follow-up with me again in 4 weeks.
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
## 2017-05-12 NOTE — Assessment & Plan Note (Signed)
Labs ordered today

## 2017-05-12 NOTE — Assessment & Plan Note (Signed)
Decision today to treat with OMT was based on Physical Exam  After verbal consent patient was treated with HVLA, ME, FPR techniques in cervical, thoracic, lumbar and sacral areas  Patient tolerated the procedure well with improvement in symptoms  Patient given exercises, stretches and lifestyle modifications  See medications in patient instructions if given  Patient will follow up in 4 weeks 

## 2017-05-12 NOTE — Progress Notes (Signed)
z

## 2017-05-12 NOTE — Patient Instructions (Signed)
Good to see you.  The patient is advised to apply ice or cold packs intermittently as needed to relieve pain. Exercises 3 times a week.  pennsaid pinkie amount topically 2 times daily as needed.  Over the counter get  Vitamin D 2000 IU daily  Turmeric 500mg  daily  We will check labs today  See me again in 3-4 weeks.

## 2017-05-13 LAB — ANA: Anti Nuclear Antibody(ANA): NEGATIVE

## 2017-05-13 LAB — THYROID PEROXIDASE ANTIBODY: Thyroperoxidase Ab SerPl-aCnc: <1 IU/mL (ref <9)

## 2017-05-13 LAB — CALCIUM, IONIZED: Calcium, Ion: 5.3 mg/dL (ref 4.8–5.6)

## 2017-05-13 LAB — RHEUMATOID FACTOR: Rhuematoid fact SerPl-aCnc: <14 IU/mL (ref <14)

## 2017-05-21 ENCOUNTER — Ambulatory Visit: Payer: Self-pay | Admitting: Neurology

## 2017-05-31 ENCOUNTER — Encounter: Payer: Self-pay | Admitting: Neurology

## 2017-05-31 ENCOUNTER — Ambulatory Visit: Payer: BC Managed Care – PPO | Admitting: Neurology

## 2017-05-31 VITALS — BP 130/72 | HR 69 | Ht 61.0 in | Wt 134.4 lb

## 2017-05-31 DIAGNOSIS — G43709 Chronic migraine without aura, not intractable, without status migrainosus: Secondary | ICD-10-CM | POA: Diagnosis not present

## 2017-05-31 DIAGNOSIS — G4440 Drug-induced headache, not elsewhere classified, not intractable: Secondary | ICD-10-CM

## 2017-05-31 DIAGNOSIS — M542 Cervicalgia: Secondary | ICD-10-CM | POA: Diagnosis not present

## 2017-05-31 NOTE — Patient Instructions (Signed)
1.  Continue turmeric, vitamin D and pennsaid pinkie ointment as per Dr. Tamala Julian 2.  Stop ibuprofen.  Instead, use Tylenol for acute headache but limit to no more than 2 days out of the week.  Taking pain relievers more than 2 days a week may cause daily or frequent headache 3.  Continue therapy with Dr. Tamala Julian.  If headaches not improved with therapy, then contact me (MyChart) and we can start a daily preventative medication 4.  Follow up in 2 to 3 months.
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
## 2017-06-02 ENCOUNTER — Ambulatory Visit: Payer: Self-pay | Admitting: Neurology

## 2017-06-04 ENCOUNTER — Ambulatory Visit: Payer: BC Managed Care – PPO | Admitting: Family Medicine

## 2017-06-04 ENCOUNTER — Encounter: Payer: Self-pay | Admitting: Family Medicine

## 2017-06-04 VITALS — BP 110/82 | HR 96 | Ht 61.0 in | Wt 135.0 lb

## 2017-06-04 DIAGNOSIS — M999 Biomechanical lesion, unspecified: Secondary | ICD-10-CM

## 2017-06-04 DIAGNOSIS — G43011 Migraine without aura, intractable, with status migrainosus: Secondary | ICD-10-CM

## 2017-06-04 MED ORDER — VITAMIN D (ERGOCALCIFEROL) 1.25 MG (50000 UNIT) PO CAPS: 50000.0000 [IU] | ORAL_CAPSULE | ORAL | 0 refills | Status: DC

## 2017-06-04 NOTE — Assessment & Plan Note (Signed)
Patient is doing very well with the changes that neurology has been [facility] formal physical therapy.  Discussed doing the home exercises more religiously.  He does respond well to osteopathic manipulation.  Follow-up again in 4-6 weeks.

## 2017-06-04 NOTE — Assessment & Plan Note (Signed)
Decision today to treat with OMT was based on Physical Exam  After verbal consent patient was treated with HVLA, ME, FPR techniques in cervical, thoracic, rib, lumbar and sacral areas  Patient tolerated the procedure well with improvement in symptoms  Patient given exercises, stretches and lifestyle modifications  See medications in patient instructions if given  Patient will follow up in 4 weeks 

## 2017-06-04 NOTE — Progress Notes (Signed)
[facility] [place], [place] [address] Phone: [phone] Subjective:      CC: Neck and back pain, headache  follow-up  BSJ:[id number]  Alisha Reynolds is a 21 y.o. female coming in for follow up for neck and back pain. She said that last Saturday she woke up with cervical spine pain and was unable to move her neck. She also notes that she woke up with a headache that same morning. She has been doing the exercises which seem to help. She went to the neurologist on Monday and was told to not take any medication so she has not started any of the supplements that we recommended to her.       Past Medical History:  Diagnosis Date  . Anemia 10/30/11  . Asthma   . Dysmenorrhea 10/30/11  . Headache   . Pneumonia 10/30/11  . Recurrent tonsillitis   . Yeast infection    Past Surgical History:  Procedure Laterality Date  . TONSILLECTOMY  09/28/14  . WISDOM TOOTH EXTRACTION     Social History   Socioeconomic History  . Marital status: Single    Spouse name: None  . Number of children: 0  . Years of education: 49  . Highest education level: None  Social Needs  . Financial resource strain: None  . Food insecurity - worry: None  . Food insecurity - inability: None  . Transportation needs - medical: None  . Transportation needs - non-medical: None  Occupational History    Comment: [employer]  Tobacco Use  . Smoking status: Never Smoker  . Smokeless tobacco: Never Used  Substance and Sexual Activity  . Alcohol use: No  . Drug use: No  . Sexual activity: Yes    Birth control/protection: None  Other Topics Concern  . None  Social History Narrative   Lives at home with mom, dad, younger brother   Caffeine use- 1 cup daily   No Known Allergies Family History  Problem Relation Age of Onset  . Cancer - Colon Maternal Grandmother   . Migraines Mother   . Autism spectrum disorder Unknown        Younger half-Brother     Past medical  history, social, surgical and family history all reviewed in electronic medical record.  No pertanent information unless stated regarding to the chief complaint.   Review of Systems:Review of systems updated and as accurate as of 06/04/17  No headache, visual changes, nausea, vomiting, diarrhea, constipation, dizziness, abdominal pain, skin rash, fevers, chills, night sweats, weight loss, swollen lymph nodes, body aches, joint swelling, muscle aches, chest pain, shortness of breath, mood changes.   Objective  Blood pressure 110/82, pulse 96, height 5\' 1"  (1.549 m), weight 135 lb (61.2 kg), SpO2 98 %. Systems examined below as of 06/04/17   General: No apparent distress alert and oriented x3 mood and affect normal, dressed appropriately.  HEENT: Pupils equal, extraocular movements intact  Respiratory: Patient's speak in full sentences and does not appear short of breath  Cardiovascular: No lower extremity edema, non tender, no erythema  Skin: Warm dry intact with no signs of infection or rash on extremities or on axial skeleton.  Abdomen: Soft nontender  Neuro: Cranial nerves II through XII are intact, neurovascularly intact in all extremities with 2+ DTRs and 2+ pulses.  Lymph: No lymphadenopathy of posterior or anterior cervical chain or axillae bilaterally.  Gait normal with good balance and coordination.  MSK:  Non  tender with full range of motion and good stability and symmetric strength and tone of shoulders, elbows, wrist, hip, knee and ankles bilaterally.  Neck: Inspection mild fullness of the thyroid. No palpable stepoffs. Negative Spurling's maneuver. Mild limitation in range of motion lacking left 5 degrees Grip strength and sensation normal in bilateral hands Strength good C4 to T1 distribution No sensory change to C4 to T1 Negative Hoffman sign bilaterally Reflexes normal   Osteopathic findings C2 flexed rotated and side bent right C4 flexed rotated and side bent left C7  flexed rotated and side bent left T3 extended rotated and side bent right inhaled third rib T9 extended rotated and side bent left L1 flexed rotated and side bent right Sacrum right on right    Impression and Recommendations:     This case required medical decision making of moderate complexity.      Note: This dictation was prepared with Dragon dictation along with smaller phrase technology. Any transcriptional errors that result from this process are unintentional.

## 2017-06-04 NOTE — Patient Instructions (Signed)
Good to see you  Vitamin D is very low so stop the daily and start the once weekly for next 12 weeks.  Keep doing everything else you are doing Read about Effexor and we may consider that if not making too much improvement See me again in 4 weeks

## 2017-07-05 ENCOUNTER — Ambulatory Visit: Payer: Self-pay | Admitting: Family Medicine

## 2017-07-09 ENCOUNTER — Other Ambulatory Visit: Payer: Self-pay

## 2017-07-09 ENCOUNTER — Inpatient Hospital Stay (HOSPITAL_COMMUNITY)
Admission: AD | Admit: 2017-07-09 | Discharge: 2017-07-09 | Disposition: A | Discharge status: Home / Self Care | Payer: BC Managed Care – PPO | Source: Ambulatory Visit | Attending: Obstetrics and Gynecology | Admitting: Obstetrics and Gynecology

## 2017-07-09 ENCOUNTER — Encounter (HOSPITAL_COMMUNITY): Payer: Self-pay | Admitting: *Deleted

## 2017-07-09 DIAGNOSIS — Z810 Family history of intellectual disabilities: Secondary | ICD-10-CM | POA: Insufficient documentation

## 2017-07-09 DIAGNOSIS — J45909 Unspecified asthma, uncomplicated: Secondary | ICD-10-CM | POA: Insufficient documentation

## 2017-07-09 DIAGNOSIS — R102 Pelvic and perineal pain: Secondary | ICD-10-CM | POA: Insufficient documentation

## 2017-07-09 DIAGNOSIS — Z820 Family history of epilepsy and other diseases of the nervous system: Secondary | ICD-10-CM | POA: Insufficient documentation

## 2017-07-09 DIAGNOSIS — N760 Acute vaginitis: Secondary | ICD-10-CM

## 2017-07-09 DIAGNOSIS — Z8619 Personal history of other infectious and parasitic diseases: Secondary | ICD-10-CM | POA: Diagnosis not present

## 2017-07-09 DIAGNOSIS — G43011 Migraine without aura, intractable, with status migrainosus: Secondary | ICD-10-CM | POA: Insufficient documentation

## 2017-07-09 DIAGNOSIS — B9689 Other specified bacterial agents as the cause of diseases classified elsewhere: Secondary | ICD-10-CM | POA: Diagnosis not present

## 2017-07-09 DIAGNOSIS — Z800 Family history of malignant neoplasm of digestive organs: Secondary | ICD-10-CM | POA: Insufficient documentation

## 2017-07-09 DIAGNOSIS — Z3202 Encounter for pregnancy test, result negative: Secondary | ICD-10-CM | POA: Diagnosis not present

## 2017-07-09 DIAGNOSIS — Z9889 Other specified postprocedural states: Secondary | ICD-10-CM | POA: Diagnosis not present

## 2017-07-09 DIAGNOSIS — R3911 Hesitancy of micturition: Secondary | ICD-10-CM | POA: Diagnosis present

## 2017-07-09 DIAGNOSIS — Z79899 Other long term (current) drug therapy: Secondary | ICD-10-CM | POA: Insufficient documentation

## 2017-07-09 DIAGNOSIS — R300 Dysuria: Secondary | ICD-10-CM | POA: Diagnosis present

## 2017-07-09 DIAGNOSIS — N898 Other specified noninflammatory disorders of vagina: Secondary | ICD-10-CM | POA: Diagnosis not present

## 2017-07-09 LAB — URINALYSIS, ROUTINE W REFLEX MICROSCOPIC
Bilirubin Urine: NEGATIVE
Glucose, UA: NEGATIVE mg/dL
Hgb urine dipstick: NEGATIVE
Ketones, ur: NEGATIVE mg/dL
Nitrite: NEGATIVE
Protein, ur: 30 mg/dL — AB
Specific Gravity, Urine: 1.024 (ref 1.005–1.030)
pH: 6 (ref 5.0–8.0)

## 2017-07-09 LAB — WET PREP, GENITAL
Sperm: NONE SEEN
Trich, Wet Prep: NONE SEEN
Yeast Wet Prep HPF POC: NONE SEEN

## 2017-07-09 LAB — POCT PREGNANCY, URINE: Preg Test, Ur: NEGATIVE

## 2017-07-09 MED ORDER — METRONIDAZOLE 0.75 % VA GEL: 1.0000 | Freq: Every day | VAGINAL | 0 refills | Status: DC

## 2017-07-09 NOTE — MAU Note (Addendum)
For 2 days my vagina has been red and swollen. Clear, watery d/c. Inside of vagina is burning and stings when I pee. Hurts to sit, walk, stand. Just really sore.

## 2017-07-09 NOTE — MAU Provider Note (Signed)
History    KOLLINS FENTER is a 21y.o. Female who presents, unannounced, for discharge and pain during urination.  Patient states pain started 2 days ago and has been causing discomfort with walking, sitting, and patient regards as worse pain ever.  Patient states area is red and swollen.  Reports discharge is watery and white. Patient denies smell, but reports some urinary hesitancy.     Patient Active Problem List   Diagnosis Date Noted  . Enlarged thyroid 05/12/2017  . Nonallopathic lesion of cervical region 05/12/2017  . Nonallopathic lesion of thoracic region 05/12/2017  . Nonallopathic lesion of lumbosacral region 05/12/2017  . Asthma 01/29/2016  . Intractable migraine without aura and with status migrainosus 08/15/2015  . MDD (major depressive disorder), single episode, severe ([place]) 12/01/2013  . Generalized anxiety disorder 11/30/2013    Chief Complaint  Patient presents with  . Vaginal Pain   HPI  OB History    Gravida Para Term Preterm AB Living   1       1 0   SAB TAB Ectopic Multiple Live Births   1              Past Medical History:  Diagnosis Date  . Anemia 10/30/11  . Asthma   . Dysmenorrhea 10/30/11  . Headache   . Pneumonia 10/30/11  . Recurrent tonsillitis   . Yeast infection     Past Surgical History:  Procedure Laterality Date  . TONSILLECTOMY  09/28/14  . WISDOM TOOTH EXTRACTION      Family History  Problem Relation Age of Onset  . Cancer - Colon Maternal Grandmother   . Migraines Mother   . Autism spectrum disorder Unknown        Younger half-Brother    Social History   Tobacco Use  . Smoking status: Never Smoker  . Smokeless tobacco: Never Used  Substance Use Topics  . Alcohol use: No  . Drug use: No    Allergies: No Known Allergies  Medications Prior to Admission  Medication Sig Dispense Refill Last Dose  . naproxen (NAPROSYN) 500 MG tablet Take 1 tablet (500 mg total) by mouth 2 (two) times daily. (Patient taking differently: Take 500  mg by mouth 2 (two) times daily as needed for mild pain or moderate pain. ) 30 tablet 0 Past Month at Unknown time  . Vitamin D, Ergocalciferol, (DRISDOL) 50000 units CAPS capsule Take 1 capsule (50,000 Units total) every 7 (seven) days by mouth. 12 capsule 0 Past Week at Unknown time  . albuterol (PROVENTIL) (5 MG/ML) 0.5% nebulizer solution Take 2.5 mg by nebulization every 6 (six) hours as needed for wheezing or shortness of breath.   Unknown at Unknown time  . metroNIDAZOLE (FLAGYL) 500 MG tablet Take 1 tablet (500 mg total) by mouth 2 (two) times daily. 14 tablet 0 Taking    ROS  See HPI Above Physical Exam   Blood pressure (!) 141/66, pulse 81, temperature 98.4 F (36.9 C), resp. rate 18, height 5\' 1"  (1.549 m), weight 64 kg (141 lb), last menstrual period 06/21/2017.  Results for orders placed or performed during the hospital encounter of 07/09/17 (from the past 24 hour(s))  Urinalysis, Routine w reflex microscopic     Status: Abnormal   Collection Time: 07/09/17  9:00 PM  Result Value Ref Range   Color, Urine YELLOW YELLOW   APPearance HAZY (A) CLEAR   Specific Gravity, Urine 1.024 1.005 - 1.030   pH 6.0 5.0 - 8.0  Glucose, UA NEGATIVE NEGATIVE mg/dL   Hgb urine dipstick NEGATIVE NEGATIVE   Bilirubin Urine NEGATIVE NEGATIVE   Ketones, ur NEGATIVE NEGATIVE mg/dL   Protein, ur 30 (A) NEGATIVE mg/dL   Nitrite NEGATIVE NEGATIVE   Leukocytes, UA MODERATE (A) NEGATIVE   RBC / HPF 0-5 0 - 5 RBC/hpf   WBC, UA 0-5 0 - 5 WBC/hpf   Bacteria, UA RARE (A) NONE SEEN   Squamous Epithelial / LPF 6-30 (A) NONE SEEN   Mucus PRESENT   Wet prep, genital     Status: Abnormal   Collection Time: 07/09/17 10:20 PM  Result Value Ref Range   Yeast Wet Prep HPF POC NONE SEEN NONE SEEN   Trich, Wet Prep NONE SEEN NONE SEEN   Clue Cells Wet Prep HPF POC PRESENT (A) NONE SEEN   WBC, Wet Prep HPF POC MANY (A) NONE SEEN   Sperm NONE SEEN   Pregnancy, urine POC     Status: None   Collection Time:  07/09/17 10:49 PM  Result Value Ref Range   Preg Test, Ur NEGATIVE NEGATIVE    Physical Exam  Constitutional: She is oriented to person, place, and time. She appears well-developed and well-nourished. No distress.  HENT:  Head: Normocephalic and atraumatic.  Eyes: Conjunctivae are normal.  Neck: Normal range of motion.  Cardiovascular: Normal rate, regular rhythm and normal heart sounds.  Respiratory: Effort normal and breath sounds normal.  GI: Soft. Bowel sounds are normal.  Genitourinary: There is tenderness on the right labia. There is tenderness on the left labia. Cervix exhibits no motion tenderness and no discharge. There is tenderness in the vagina. No bleeding in the vagina. Vaginal discharge found.  Genitourinary Comments: Speculum Exam: -Vaginal Vault: Copious amt white curdy discharge -wet prep collected -Cervix:No discharge from os-GC/CT collected -Bimanual Exam: Adnexa palpated    Musculoskeletal: Normal range of motion.  Neurological: She is alert and oriented to person, place, and time.  Skin: Skin is warm and dry.  Psychiatric: She has a normal mood and affect. Her behavior is normal.     ED Course  Assessment: 21 y.o. Female Vaginal Discharge Vaginal Pain  Plan: -PE as above -Labs: Wet Prep, UA, GC/CT  Follow Up (2305) -Wet Prep positive for Clue Cells -Discussed ways to prevent bacterial vaginosis:  +Wear cotton underwear +Use low scent or scent free soaps and detergents +No douching +Also discussed possibility of Boric Acid regime and patient encouraged to discuss with Floydene Flock, PA +Encouraged utilization of lubrication during sexual acts with condom usage +Encouraged increased H2O intake -Rx for metrogel sent; Instructed to use first dose immediately upon obtaining to provide relief -No other q/c -Discharged to home in stable condition Encouraged to call if any questions or concerns arise prior to office visit.   Maryann Conners CNM,  MSN 07/09/2017 10:03 PM

## 2017-07-09 NOTE — Progress Notes (Signed)
Jessica Emly CNM in to discuss test results and d/c plan. Written and verbal d/c instructions given and understanding voiced. 

## 2017-07-09 NOTE — Discharge Instructions (Signed)
Bacterial Vaginosis Bacterial vaginosis is a vaginal infection that occurs when the normal balance of bacteria in the vagina is disrupted. It results from an overgrowth of certain bacteria. This is the most common vaginal infection among women ages 7-44. Because bacterial vaginosis increases your risk for STIs (sexually transmitted infections), getting treated can help reduce your risk for chlamydia, gonorrhea, herpes, and HIV (human immunodeficiency virus). Treatment is also important for preventing complications in pregnant women, because this condition can cause an early (premature) delivery. What are the causes? This condition is caused by an increase in harmful bacteria that are normally present in small amounts in the vagina. However, the reason that the condition develops is not fully understood. What increases the risk? The following factors may make you more likely to develop this condition:  Having a new sexual partner or multiple sexual partners.  Having unprotected sex.  Douching.  Having an intrauterine device (IUD).  Smoking.  Drug and alcohol abuse.  Taking certain antibiotic medicines.  Being pregnant.  You cannot get bacterial vaginosis from toilet seats, bedding, swimming pools, or contact with objects around you. What are the signs or symptoms? Symptoms of this condition include:  Grey or white vaginal discharge. The discharge can also be watery or foamy.  A fish-like odor with discharge, especially after sexual intercourse or during menstruation.  Itching in and around the vagina.  Burning or pain with urination.  Some women with bacterial vaginosis have no signs or symptoms. How is this diagnosed? This condition is diagnosed based on:  Your medical history.  A physical exam of the vagina.  Testing a sample of vaginal fluid under a microscope to look for a large amount of bad bacteria or abnormal cells. Your health care provider may use a cotton swab  or a small wooden spatula to collect the sample.  How is this treated? This condition is treated with antibiotics. These may be given as a pill, a vaginal cream, or a medicine that is put into the vagina (suppository). If the condition comes back after treatment, a second round of antibiotics may be needed. Follow these instructions at home: Medicines  Take over-the-counter and prescription medicines only as told by your health care provider.  Take or use your antibiotic as told by your health care provider. Do not stop taking or using the antibiotic even if you start to feel better. General instructions  If you have a female sexual partner, tell her that you have a vaginal infection. She should see her health care provider and be treated if she has symptoms. If you have a female sexual partner, he does not need treatment.  During treatment: ? Avoid sexual activity until you finish treatment. ? Do not douche. ? Avoid alcohol as directed by your health care provider. ? Avoid breastfeeding as directed by your health care provider.  Drink enough water and fluids to keep your urine clear or pale yellow.  Keep the area around your vagina and rectum clean. ? Wash the area daily with warm water. ? Wipe yourself from front to back after using the toilet.  Keep all follow-up visits as told by your health care provider. This is important. How is this prevented?  Do not douche.  Wash the outside of your vagina with warm water only.  Use protection when having sex. This includes latex condoms and dental dams.  Limit how many sexual partners you have. To help prevent bacterial vaginosis, it is best to have sex with just  one partner (monogamous).  Make sure you and your sexual partner are tested for STIs.  Wear cotton or cotton-lined underwear.  Avoid wearing tight pants and pantyhose, especially during summer.  Limit the amount of alcohol that you drink.  Do not use any products that  contain nicotine or tobacco, such as cigarettes and e-cigarettes. If you need help quitting, ask your health care provider.  Do not use illegal drugs. Where to find more information:  Centers for Disease Control and Prevention: AppraiserFraud.fi  American Sexual Health Association (ASHA): www.ashastd.org  U.S. Department of Health and Financial controller, Office on Women's Health: DustingSprays.pl or SecuritiesCard.it Contact a health care provider if:  Your symptoms do not improve, even after treatment.  You have more discharge or pain when urinating.  You have a fever.  You have pain in your abdomen.  You have pain during sex.  You have vaginal bleeding between periods. Summary  Bacterial vaginosis is a vaginal infection that occurs when the normal balance of bacteria in the vagina is disrupted.  Because bacterial vaginosis increases your risk for STIs (sexually transmitted infections), getting treated can help reduce your risk for chlamydia, gonorrhea, herpes, and HIV (human immunodeficiency virus). Treatment is also important for preventing complications in pregnant women, because the condition can cause an early (premature) delivery.  This condition is treated with antibiotic medicines. These may be given as a pill, a vaginal cream, or a medicine that is put into the vagina (suppository). This information is not intended to replace advice given to you by your health care provider. Make sure you discuss any questions you have with your health care provider. Document Released: 07/13/2005 Document Revised: 03/28/2016 Document Reviewed: 03/28/2016 Elsevier Interactive Patient Education  2017 Elsevier Inc.  Metronidazole vaginal gel What is this medicine? METRONIDAZOLE (me troe NI da zole) VAGINAL GEL is an antiinfective. It is used to treat bacterial vaginitis. This medicine may be used for other purposes; ask your health care provider or  pharmacist if you have questions. COMMON BRAND NAME(S): MetroGel, MetroGel Vaginal, MetroGel-Vaginal, NUVESSA, Vandazole  What should I tell my health care provider before I take this medicine? They need to know if you have any of these conditions: -if you drink alcohol containing drinks -if you have taken disulfiram in the past two weeks -liver disease -peripheral neuropathy -seizures -an unusual or allergic reaction to metronidazole, parabens, nitroimidazoles, or other medicines, foods, dyes, or preservatives -pregnant or trying to get pregnant -breast-feeding How should I use this medicine? This medicine is only for use in the vagina. Do not take by mouth or apply to other areas of the body. Follow the directions on the prescription label. Wash hands before and after use. Screw the applicator to the tube and squeeze the tube gently to fill the applicator. Lie on your back, part and bend your knees. Insert the applicator tip high in the vagina and push the plunger to release the gel into the vagina. Gently remove the applicator. Wash the applicator well with warm water and soap. Use at regular intervals. Finish the full course prescribed by your doctor or health care professional even if you think your condition is better. Do not stop using except on the advice of your doctor or health care professional. Talk to your pediatrician regarding the use of this medicine in children. Special care may be needed. Overdosage: If you think you have taken too much of this medicine contact a poison control center or emergency room at once. NOTE: This  medicine is only for you. Do not share this medicine with others. What if I miss a dose? If you miss a dose, use it as soon as you can. If it is almost time for your next dose, use only that dose. Do not use double or extra doses. What may interact with this medicine? Do not take this medicine with any of the following medications: -alcohol or any product  that contains alcohol -cisapride -dofetilide -dronedarone -pimozide -thioridazine -ziprasidone This medicine may also interact with the following medications: -cimetidine -lithium -other medicines that prolong the QT interval (cause an abnormal heart rhythm) -warfarin This list may not describe all possible interactions. Give your health care provider a list of all the medicines, herbs, non-prescription drugs, or dietary supplements you use. Also tell them if you smoke, drink alcohol, or use illegal drugs. Some items may interact with your medicine. What should I watch for while using this medicine? Tell your doctor or health care professional if your symptoms do not start to get better in 2 or 3 days. Avoid alcoholic drinks while you are taking this medicine and for three days afterwards. Alcohol may make you feel dizzy, sick, or flushed. You may get drowsy or dizzy. Do not drive, use machinery, or do anything that needs mental alertness until you know how this medicine affects you. To reduce the risk of dizzy or fainting spells, do not sit or stand up quickly, especially if you are an older patient. Your clothing may get soiled if you have a vaginal discharge. You can wear a sanitary napkin. Do not use tampons. Wear freshly washed cotton, not synthetic, panties. Do not have sex until you have finished your treatment. Having sex can make the treatment less effective. Your sexual partner may also need treatment. What side effects may I notice from receiving this medicine? Side effects that you should report to your doctor or health care professional as soon as possible: -dizziness -frequent passing of urine -headache -loss of appetite -nausea -skin rash, itching -stomach pain or cramps -vaginal irritation or discharge -vulvar burning or swelling Side effects that usually do not require medical attention (report to your doctor or health care professional if they continue or are  bothersome): -dark urine -mild vaginal burning This list may not describe all possible side effects. Call your doctor for medical advice about side effects. You may report side effects to FDA at 1-800-FDA-1088. Where should I keep my medicine? Keep out of the reach of children. Store at room temperature between 15 and 30 degrees C (59 and 86 degrees F). Do not freeze. Throw away any unused medicine after the expiration date. NOTE: This sheet is a summary. It may not cover all possible information. If you have questions about this medicine, talk to your doctor, pharmacist, or health care provider.  2018 Elsevier/Gold Standard (2013-02-17 14:09:23)

## 2017-07-09 NOTE — Progress Notes (Signed)
Gavin Pound CNM notified of pt's test results. Will see pt for d/c home

## 2017-07-09 NOTE — Progress Notes (Signed)
Jessic Emly CNM notified of pt's admission and status. Will see pt

## 2017-07-12 LAB — GC/CHLAMYDIA PROBE AMP (~~LOC~~) NOT AT ARMC
Chlamydia: NEGATIVE
Neisseria Gonorrhea: NEGATIVE

## 2017-08-05 ENCOUNTER — Ambulatory Visit: Payer: Self-pay | Admitting: Family Medicine

## 2017-08-18 ENCOUNTER — Ambulatory Visit: Payer: Self-pay | Admitting: Family Medicine
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
## 2017-08-28 ENCOUNTER — Other Ambulatory Visit: Payer: Self-pay | Admitting: Family Medicine
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
## 2017-08-31 ENCOUNTER — Encounter: Payer: Self-pay | Admitting: Family Medicine

## 2017-08-31 ENCOUNTER — Ambulatory Visit: Payer: BC Managed Care – PPO | Admitting: Family Medicine

## 2017-08-31 VITALS — BP 130/72 | HR 73 | Ht 61.0 in | Wt 138.0 lb

## 2017-08-31 DIAGNOSIS — M999 Biomechanical lesion, unspecified: Secondary | ICD-10-CM

## 2017-08-31 DIAGNOSIS — R51 Headache: Secondary | ICD-10-CM

## 2017-08-31 DIAGNOSIS — G4486 Cervicogenic headache: Secondary | ICD-10-CM | POA: Insufficient documentation

## 2017-08-31 NOTE — Assessment & Plan Note (Signed)
Decision today to treat with OMT was based on Physical Exam  After verbal consent patient was treated with HVLA, ME, FPR techniques in cervical, thoracic, lumbar and sacral areas  Patient tolerated the procedure well with improvement in symptoms  Patient given exercises, stretches and lifestyle modifications  See medications in patient instructions if given  Patient will follow up in 4-8 weeks 

## 2017-08-31 NOTE — Patient Instructions (Signed)
Good to see you  Ice Is your friend.  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  Keep working out I think you are doing great!

## 2017-08-31 NOTE — Assessment & Plan Note (Signed)
Patient does have more of a cervicogenic headache.  Discussed with patient at great length.  Doing better.  Patient was to take the Effexor.  Encourage her to do this on a more regular basis.  We may want to consider starting a once weekly vitamin D again.  Patient has not been taking the medications on a regular basis at this point.  Follow-up again in 4-8 weeks.

## 2017-09-02 ENCOUNTER — Ambulatory Visit: Payer: Self-pay | Admitting: Family Medicine

## 2017-09-08 DIAGNOSIS — E559 Vitamin D deficiency, unspecified: Secondary | ICD-10-CM | POA: Insufficient documentation

## 2017-09-10 ENCOUNTER — Ambulatory Visit: Payer: Self-pay | Admitting: Neurology

## 2017-11-01 ENCOUNTER — Ambulatory Visit: Payer: Self-pay | Admitting: Family Medicine
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
## 2017-11-16 ENCOUNTER — Ambulatory Visit (INDEPENDENT_AMBULATORY_CARE_PROVIDER_SITE_OTHER)
Admission: RE | Admit: 2017-11-16 | Discharge: 2017-11-16 | Disposition: A | Discharge status: Home / Self Care | Payer: BC Managed Care – PPO | Source: Ambulatory Visit | Attending: Family Medicine | Admitting: Family Medicine

## 2017-11-16 ENCOUNTER — Encounter: Payer: Self-pay | Admitting: Family Medicine

## 2017-11-16 ENCOUNTER — Ambulatory Visit: Payer: BC Managed Care – PPO | Admitting: Family Medicine

## 2017-11-16 VITALS — BP 108/76 | HR 79 | Ht 61.0 in | Wt 134.0 lb

## 2017-11-16 DIAGNOSIS — M5410 Radiculopathy, site unspecified: Secondary | ICD-10-CM | POA: Diagnosis not present

## 2017-11-16 DIAGNOSIS — M999 Biomechanical lesion, unspecified: Secondary | ICD-10-CM

## 2017-11-16 DIAGNOSIS — M5416 Radiculopathy, lumbar region: Secondary | ICD-10-CM

## 2017-11-16 DIAGNOSIS — R51 Headache: Secondary | ICD-10-CM

## 2017-11-16 DIAGNOSIS — G4486 Cervicogenic headache: Secondary | ICD-10-CM

## 2017-11-16 MED ORDER — GABAPENTIN 100 MG PO CAPS: 200.0000 mg | ORAL_CAPSULE | Freq: Every day | ORAL | 3 refills | Status: DC

## 2017-11-16 NOTE — Assessment & Plan Note (Signed)
Decision today to treat with OMT was based on Physical Exam  After verbal consent patient was treated with HVLA, ME, FPR techniques in cervical, thoracic, lumbar and sacral areas  Patient tolerated the procedure well with improvement in symptoms  Patient given exercises, stretches and lifestyle modifications  See medications in patient instructions if given  Patient will follow up in 3-4 weeks  
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
## 2017-11-16 NOTE — Patient Instructions (Signed)
Good to see you  We will get xrays today  Ice 20 minutes 2 times daily. Usually after activity and before bed. Exercises 3 times a week.  Gabapentin 200mg  at night Stay active though  See me again in 4 weeks and if not a lot better we will need MRI

## 2017-11-16 NOTE — Assessment & Plan Note (Signed)
Patient does have more of a cervicogenic headaches.  Discussed posture and ergonomics.  Discussed which activities to do which wants to avoid.  Patient is to continue with the posture.

## 2017-12-05 ENCOUNTER — Other Ambulatory Visit: Payer: Self-pay

## 2017-12-05 ENCOUNTER — Encounter (HOSPITAL_COMMUNITY): Payer: Self-pay

## 2017-12-05 ENCOUNTER — Inpatient Hospital Stay (HOSPITAL_COMMUNITY)
Admission: AD | Admit: 2017-12-05 | Discharge: 2017-12-05 | Disposition: A | Discharge status: Home / Self Care | Payer: BC Managed Care – PPO | Source: Ambulatory Visit | Attending: Obstetrics and Gynecology | Admitting: Obstetrics and Gynecology

## 2017-12-05 DIAGNOSIS — B373 Candidiasis of vulva and vagina: Secondary | ICD-10-CM | POA: Insufficient documentation

## 2017-12-05 DIAGNOSIS — J45909 Unspecified asthma, uncomplicated: Secondary | ICD-10-CM | POA: Diagnosis not present

## 2017-12-05 DIAGNOSIS — Z79899 Other long term (current) drug therapy: Secondary | ICD-10-CM | POA: Insufficient documentation

## 2017-12-05 DIAGNOSIS — B9689 Other specified bacterial agents as the cause of diseases classified elsewhere: Secondary | ICD-10-CM

## 2017-12-05 DIAGNOSIS — Z87442 Personal history of urinary calculi: Secondary | ICD-10-CM | POA: Insufficient documentation

## 2017-12-05 DIAGNOSIS — R300 Dysuria: Secondary | ICD-10-CM | POA: Diagnosis present

## 2017-12-05 DIAGNOSIS — N760 Acute vaginitis: Secondary | ICD-10-CM

## 2017-12-05 DIAGNOSIS — N3000 Acute cystitis without hematuria: Secondary | ICD-10-CM | POA: Diagnosis not present

## 2017-12-05 DIAGNOSIS — B3731 Acute candidiasis of vulva and vagina: Secondary | ICD-10-CM

## 2017-12-05 LAB — URINALYSIS, ROUTINE W REFLEX MICROSCOPIC
Bilirubin Urine: NEGATIVE
Glucose, UA: NEGATIVE mg/dL
Hgb urine dipstick: NEGATIVE
Ketones, ur: NEGATIVE mg/dL
Nitrite: NEGATIVE
Protein, ur: NEGATIVE mg/dL
Specific Gravity, Urine: 1.023 (ref 1.005–1.030)
pH: 6 (ref 5.0–8.0)

## 2017-12-05 LAB — WET PREP, GENITAL
Sperm: NONE SEEN
Trich, Wet Prep: NONE SEEN

## 2017-12-05 LAB — POCT PREGNANCY, URINE: Preg Test, Ur: NEGATIVE

## 2017-12-05 MED ORDER — FLUCONAZOLE 150 MG PO TABS: 150.0000 mg | ORAL_TABLET | Freq: Once | ORAL | 0 refills | Status: AC

## 2017-12-05 MED ORDER — PHENAZOPYRIDINE HCL 100 MG PO TABS: 100.0000 mg | ORAL_TABLET | Freq: Three times a day (TID) | ORAL | 0 refills | Status: DC | PRN

## 2017-12-05 MED ORDER — METRONIDAZOLE 0.75 % VA GEL: 1.0000 | Freq: Two times a day (BID) | VAGINAL | 0 refills | Status: DC

## 2017-12-05 NOTE — Discharge Instructions (Signed)
Bacterial Vaginosis Bacterial vaginosis is a vaginal infection that occurs when the normal balance of bacteria in the vagina is disrupted. It results from an overgrowth of certain bacteria. This is the most common vaginal infection among women ages 7-44. Because bacterial vaginosis increases your risk for STIs (sexually transmitted infections), getting treated can help reduce your risk for chlamydia, gonorrhea, herpes, and HIV (human immunodeficiency virus). Treatment is also important for preventing complications in pregnant women, because this condition can cause an early (premature) delivery. What are the causes? This condition is caused by an increase in harmful bacteria that are normally present in small amounts in the vagina. However, the reason that the condition develops is not fully understood. What increases the risk? The following factors may make you more likely to develop this condition:  Having a new sexual partner or multiple sexual partners.  Having unprotected sex.  Douching.  Having an intrauterine device (IUD).  Smoking.  Drug and alcohol abuse.  Taking certain antibiotic medicines.  Being pregnant.  You cannot get bacterial vaginosis from toilet seats, bedding, swimming pools, or contact with objects around you. What are the signs or symptoms? Symptoms of this condition include:  Grey or white vaginal discharge. The discharge can also be watery or foamy.  A fish-like odor with discharge, especially after sexual intercourse or during menstruation.  Itching in and around the vagina.  Burning or pain with urination.  Some women with bacterial vaginosis have no signs or symptoms. How is this diagnosed? This condition is diagnosed based on:  Your medical history.  A physical exam of the vagina.  Testing a sample of vaginal fluid under a microscope to look for a large amount of bad bacteria or abnormal cells. Your health care provider may use a cotton swab  or a small wooden spatula to collect the sample.  How is this treated? This condition is treated with antibiotics. These may be given as a pill, a vaginal cream, or a medicine that is put into the vagina (suppository). If the condition comes back after treatment, a second round of antibiotics may be needed. Follow these instructions at home: Medicines  Take over-the-counter and prescription medicines only as told by your health care provider.  Take or use your antibiotic as told by your health care provider. Do not stop taking or using the antibiotic even if you start to feel better. General instructions  If you have a female sexual partner, tell her that you have a vaginal infection. She should see her health care provider and be treated if she has symptoms. If you have a female sexual partner, he does not need treatment.  During treatment: ? Avoid sexual activity until you finish treatment. ? Do not douche. ? Avoid alcohol as directed by your health care provider. ? Avoid breastfeeding as directed by your health care provider.  Drink enough water and fluids to keep your urine clear or pale yellow.  Keep the area around your vagina and rectum clean. ? Wash the area daily with warm water. ? Wipe yourself from front to back after using the toilet.  Keep all follow-up visits as told by your health care provider. This is important. How is this prevented?  Do not douche.  Wash the outside of your vagina with warm water only.  Use protection when having sex. This includes latex condoms and dental dams.  Limit how many sexual partners you have. To help prevent bacterial vaginosis, it is best to have sex with just  one partner (monogamous).  Make sure you and your sexual partner are tested for STIs.  Wear cotton or cotton-lined underwear.  Avoid wearing tight pants and pantyhose, especially during summer.  Limit the amount of alcohol that you drink.  Do not use any products that  contain nicotine or tobacco, such as cigarettes and e-cigarettes. If you need help quitting, ask your health care provider.  Do not use illegal drugs. Where to find more information:  Centers for Disease Control and Prevention: AppraiserFraud.fi  American Sexual Health Association (ASHA): www.ashastd.org  U.S. Department of Health and Financial controller, Office on Women's Health: DustingSprays.pl or SecuritiesCard.it Contact a health care provider if:  Your symptoms do not improve, even after treatment.  You have more discharge or pain when urinating.  You have a fever.  You have pain in your abdomen.  You have pain during sex.  You have vaginal bleeding between periods. Summary  Bacterial vaginosis is a vaginal infection that occurs when the normal balance of bacteria in the vagina is disrupted.  Because bacterial vaginosis increases your risk for STIs (sexually transmitted infections), getting treated can help reduce your risk for chlamydia, gonorrhea, herpes, and HIV (human immunodeficiency virus). Treatment is also important for preventing complications in pregnant women, because the condition can cause an early (premature) delivery.  This condition is treated with antibiotic medicines. These may be given as a pill, a vaginal cream, or a medicine that is put into the vagina (suppository). This information is not intended to replace advice given to you by your health care provider. Make sure you discuss any questions you have with your health care provider. Document Released: 07/13/2005 Document Revised: 11/16/2016 Document Reviewed: 03/28/2016 Elsevier Interactive Patient Education  2018 [employer]. Vaginal Yeast infection, Adult Vaginal yeast infection is a condition that causes soreness, swelling, and redness (inflammation) of the vagina. It also causes vaginal discharge. This is a common condition. Some women get this infection  frequently. What are the causes? This condition is caused by a change in the normal balance of the yeast (candida) and bacteria that live in the vagina. This change causes an overgrowth of yeast, which causes the inflammation. What increases the risk? This condition is more likely to develop in:  Women who take antibiotic medicines.  Women who have diabetes.  Women who take birth control pills.  Women who are pregnant.  Women who douche often.  Women who have a weak defense (immune) system.  Women who have been taking steroid medicines for a long time.  Women who frequently wear tight clothing.  What are the signs or symptoms? Symptoms of this condition include:  White, thick vaginal discharge.  Swelling, itching, redness, and irritation of the vagina. The lips of the vagina (vulva) may be affected as well.  Pain or a burning feeling while urinating.  Pain during sex.  How is this diagnosed? This condition is diagnosed with a medical history and physical exam. This will include a pelvic exam. Your health care provider will examine a sample of your vaginal discharge under a microscope. Your health care provider may send this sample for testing to confirm the diagnosis. How is this treated? This condition is treated with medicine. Medicines may be over-the-counter or prescription. You may be told to use one or more of the following:  Medicine that is taken orally.  Medicine that is applied as a cream.  Medicine that is inserted directly into the vagina (suppository).  Follow these instructions at home:  Take or apply over-the-counter and prescription medicines only as told by your health care provider.  Do not have sex until your health care provider has approved. Tell your sex partner that you have a yeast infection. That person should go to his or her health care provider if he or she develops symptoms.  Do not wear tight clothes, such as pantyhose or tight  pants.  Avoid using tampons until your health care provider approves.  Eat more yogurt. This may help to keep your yeast infection from returning.  Try taking a sitz bath to help with discomfort. This is a warm water bath that is taken while you are sitting down. The water should only come up to your hips and should cover your buttocks. Do this 3-4 times per day or as told by your health care provider.  Do not douche.  Wear breathable, cotton underwear.  If you have diabetes, keep your blood sugar levels under control. Contact a health care provider if:  You have a fever.  Your symptoms go away and then return.  Your symptoms do not get better with treatment.  Your symptoms get worse.  You have new symptoms.  You develop blisters in or around your vagina.  You have blood coming from your vagina and it is not your menstrual period.  You develop pain in your abdomen. This information is not intended to replace advice given to you by your health care provider. Make sure you discuss any questions you have with your health care provider. Document Released: 04/22/2005 Document Revised: 12/25/2015 Document Reviewed: 01/14/2015 Elsevier Interactive Patient Education  2018 [place]. Acute Urinary Retention, Female Urinary retention means you are unable to pee completely or at all (empty your bladder). Follow these instructions at home:  Drink enough fluids to keep your pee (urine) clear or pale yellow.  If you are sent home with a tube that drains the bladder (catheter), there will be a drainage bag attached to it. There are two types of bags. One is big that you can wear at night without having to empty it. One is smaller and needs to be emptied more often. ? Keep the drainage bag emptied. ? Keep the drainage bag lower than the tube.  Only take medicine as told by your doctor. Contact a doctor if:  You have a low-grade fever.  You have spasms or you are leaking pee when  you have spasms. Get help right away if:  You have chills or a fever.  Your catheter stops draining pee.  Your catheter falls out.  You have increased bleeding that does not stop after you have rested and increased the amount of fluids you had been drinking. This information is not intended to replace advice given to you by your health care provider. Make sure you discuss any questions you have with your health care provider. Document Released: 12/30/2007 Document Revised: 12/19/2015 Document Reviewed: 12/22/2012 Elsevier Interactive Patient Education  2017 [employer].
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
## 2017-12-05 NOTE — MAU Note (Signed)
Patient c/o  Burning with urination Increased frequency Since Tuesday  Was seen yesterday and was told she had a uti. Was given an antibiotic and states vaginal pain started after.  Water, clear/white discharge States having vaginal pain that started today  LMP 11/12/17  On nexplanon so irregular

## 2017-12-06 LAB — GC/CHLAMYDIA PROBE AMP (~~LOC~~) NOT AT ARMC
Chlamydia: NEGATIVE
Neisseria Gonorrhea: NEGATIVE

## 2017-12-07 LAB — URINE CULTURE: Culture: NO GROWTH

## 2017-12-13 ENCOUNTER — Other Ambulatory Visit: Payer: Self-pay | Admitting: Family Medicine

## 2017-12-15 ENCOUNTER — Ambulatory Visit: Payer: Self-pay | Admitting: Family Medicine
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
## 2017-12-17 ENCOUNTER — Ambulatory Visit: Payer: Self-pay | Admitting: Neurology

## 2017-12-17 ENCOUNTER — Encounter
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
## 2018-01-03 ENCOUNTER — Ambulatory Visit: Payer: Self-pay | Admitting: Family Medicine
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
## 2018-01-25 ENCOUNTER — Ambulatory Visit: Payer: BC Managed Care – PPO | Admitting: Family Medicine

## 2018-01-25 DIAGNOSIS — Z0289 Encounter for other administrative examinations: Secondary | ICD-10-CM
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
## 2018-02-22 ENCOUNTER — Ambulatory Visit: Payer: BC Managed Care – PPO | Admitting: Family Medicine

## 2018-02-22 ENCOUNTER — Encounter: Payer: Self-pay | Admitting: Neurology

## 2018-03-07 ENCOUNTER — Encounter: Payer: Self-pay | Admitting: Family Medicine

## 2018-03-07 ENCOUNTER — Ambulatory Visit: Payer: BC Managed Care – PPO | Admitting: Family Medicine

## 2018-03-07 VITALS — BP 138/80 | HR 90 | Ht 61.0 in | Wt 121.0 lb

## 2018-03-07 DIAGNOSIS — M999 Biomechanical lesion, unspecified: Secondary | ICD-10-CM

## 2018-03-07 DIAGNOSIS — R51 Headache: Secondary | ICD-10-CM | POA: Diagnosis not present

## 2018-03-07 DIAGNOSIS — G4486 Cervicogenic headache: Secondary | ICD-10-CM

## 2018-03-07 MED ORDER — VITAMIN D (ERGOCALCIFEROL) 1.25 MG (50000 UNIT) PO CAPS: 50000.0000 [IU] | ORAL_CAPSULE | ORAL | 0 refills | Status: DC
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
## 2018-03-07 NOTE — Patient Instructions (Signed)
Good to see you  Once weekly vitamin D for 12 weeks Over the counter get  Tart cherry extract any dose at night CoQ10 200mg  daily  Posture is key  See me again in 4-6 weeks

## 2018-03-07 NOTE — Assessment & Plan Note (Signed)
Decision today to treat with OMT was based on Physical Exam  After verbal consent patient was treated with HVLA, ME, FPR techniques in cervical, thoracic, lumbar and sacral areas  Patient tolerated the procedure well with improvement in symptoms  Patient given exercises, stretches and lifestyle modifications  See medications in patient instructions if given  Patient will follow up in 4-6 weeks 
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
## 2018-03-16 ENCOUNTER — Ambulatory Visit (INDEPENDENT_AMBULATORY_CARE_PROVIDER_SITE_OTHER): Payer: BC Managed Care – PPO | Admitting: Neurology

## 2018-03-16 ENCOUNTER — Encounter: Payer: Self-pay | Admitting: Neurology

## 2018-03-16 ENCOUNTER — Encounter

## 2018-03-16 VITALS — BP 122/74 | HR 79 | Ht 61.0 in | Wt 139.0 lb

## 2018-03-16 DIAGNOSIS — G4486 Cervicogenic headache: Secondary | ICD-10-CM

## 2018-03-16 DIAGNOSIS — M542 Cervicalgia: Secondary | ICD-10-CM

## 2018-03-16 DIAGNOSIS — H9201 Otalgia, right ear: Secondary | ICD-10-CM | POA: Diagnosis not present

## 2018-03-16 DIAGNOSIS — R51 Headache: Secondary | ICD-10-CM | POA: Diagnosis not present

## 2018-03-16 MED ORDER — GABAPENTIN 100 MG PO CAPS: 200.0000 mg | ORAL_CAPSULE | Freq: Every day | ORAL | 3 refills | Status: DC

## 2018-03-16 NOTE — Patient Instructions (Addendum)
1.  Restart gabapentin 200mg  at bedtime.  If headaches/ear pain not better in 4 weeks, contact me and we can increase dose. 2.  Limit use of pain relievers to no more than 2 days out of week to prevent rebound headache 3.  MRI of cervical spine without contrast 4.  Follow up in 3 to 4 months.  We have sent a referral to [facility] for your MRI and they will call you directly to schedule your appt. They are located at [place]. If you need to contact them directly please call [phone].

## 2018-03-17 ENCOUNTER — Ambulatory Visit: Payer: Self-pay | Admitting: Neurology

## 2018-03-25 ENCOUNTER — Telehealth: Payer: Self-pay
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
## 2018-03-27 ENCOUNTER — Ambulatory Visit
Admission: RE | Admit: 2018-03-27 | Discharge: 2018-03-27 | Disposition: A | Discharge status: Home / Self Care | Payer: BC Managed Care – PPO | Source: Ambulatory Visit | Attending: Neurology | Admitting: Neurology

## 2018-03-27 DIAGNOSIS — R51 Headache: Principal | ICD-10-CM

## 2018-03-27 DIAGNOSIS — G4486 Cervicogenic headache: Secondary | ICD-10-CM

## 2018-03-27 DIAGNOSIS — M542 Cervicalgia: Secondary | ICD-10-CM

## 2018-03-30 ENCOUNTER — Encounter: Payer: Self-pay | Admitting: *Deleted

## 2018-03-30 ENCOUNTER — Telehealth: Payer: Self-pay | Admitting: Neurology

## 2018-03-30 ENCOUNTER — Telehealth: Payer: Self-pay | Admitting: *Deleted

## 2018-03-30 NOTE — Telephone Encounter (Signed)
-----   Message from Pieter Partridge, DO sent at 03/29/2018  7:46 AM EDT ----- The MRI of the cervical spine does not reveal anything specific causing the neck pain that would change management

## 2018-03-30 NOTE — Telephone Encounter (Signed)
Patient called wanting to talk to the Nurse about recent MRI Results. Please call her back [phone]. Thanks!

## 2018-03-30 NOTE — Telephone Encounter (Signed)
Results sent via My Chart.  

## 2018-03-31 NOTE — Telephone Encounter (Signed)
Pt came by office for copy of MRI result. She was unsure what degenerative changes meant. I explained that to her.

## 2018-04-15 ENCOUNTER — Other Ambulatory Visit: Payer: Self-pay | Admitting: Neurology
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
## 2018-04-18 ENCOUNTER — Ambulatory Visit: Payer: Self-pay | Admitting: Family Medicine
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
## 2018-05-02 ENCOUNTER — Ambulatory Visit: Payer: Self-pay | Admitting: Family Medicine

## 2018-05-02 DIAGNOSIS — Z0289 Encounter for other administrative examinations: Secondary | ICD-10-CM

## 2018-05-23 ENCOUNTER — Encounter: Payer: Self-pay | Admitting: Allergy

## 2018-05-23 ENCOUNTER — Ambulatory Visit (INDEPENDENT_AMBULATORY_CARE_PROVIDER_SITE_OTHER): Payer: BC Managed Care – PPO | Admitting: Allergy

## 2018-05-23 VITALS — BP 114/72 | HR 72 | Temp 98.2°F | Resp 16 | Ht 61.0 in | Wt 138.8 lb

## 2018-05-23 DIAGNOSIS — J3089 Other allergic rhinitis: Secondary | ICD-10-CM | POA: Diagnosis not present

## 2018-05-23 DIAGNOSIS — T781XXD Other adverse food reactions, not elsewhere classified, subsequent encounter: Secondary | ICD-10-CM

## 2018-05-23 DIAGNOSIS — J4520 Mild intermittent asthma, uncomplicated: Secondary | ICD-10-CM | POA: Insufficient documentation

## 2018-05-23 DIAGNOSIS — T7819XA Other adverse food reactions, not elsewhere classified, initial encounter: Secondary | ICD-10-CM | POA: Insufficient documentation

## 2018-05-23 DIAGNOSIS — T781XXA Other adverse food reactions, not elsewhere classified, initial encounter: Secondary | ICD-10-CM | POA: Insufficient documentation

## 2018-05-23 MED ORDER — OLOPATADINE HCL 0.2 % OP SOLN: 1.0000 [drp] | Freq: Every day | OPHTHALMIC | 5 refills | Status: DC

## 2018-05-23 NOTE — Assessment & Plan Note (Addendum)
Rhinoconjunctivitis symptoms only during the spring for the past few years.  Using eyedrops and Allegra with good benefit.  Today's skin testing showed: positive to grass, weed, trees, mold, dust mites, cat, dog, horse, mouse.  Discussed environmental control measures.  May use over the counter antihistamines such as Zyrtec (cetirizine), Claritin (loratadine), Allegra (fexofenadine), or Xyzal (levocetirizine) daily as needed.  May use olopatadine eye drop 1 in each eye daily as needed for itchy eyes.

## 2018-05-23 NOTE — Assessment & Plan Note (Deleted)
Rhinoconjunctivitis symptoms only during the spring for the past few years.  Using  eyedrops and Allegra with good benefit.  Today's skin testing showed:  Discussed environmental control measures.  May use over the counter antihistamines such as Zyrtec (cetirizine), Claritin (loratadine), Allegra (fexofenadine), or Xyzal (levocetirizine) daily as needed.  May use
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
## 2018-05-23 NOTE — Assessment & Plan Note (Addendum)
Diagnosed with asthma as a child. Triggers are unknown. Has albuterol on hand at home if needed.Today's spirometry was normal.  May use albuterol rescue inhaler 2 puffs or nebulizer every 4 to 6 hours as needed for shortness of breath, chest tightness, coughing, and wheezing. May use albuterol rescue inhaler 2 puffs 5 to 15 minutes prior to strenuous physical activities.

## 2018-05-23 NOTE — Patient Instructions (Addendum)
Adverse food reaction Perioral pruritus after peanuts/tree nut ingestion. Symptoms resolve within 30-60 minutes after benadryl. Denies any other symptoms. Questionable reaction to pumpkin chocolate chip cake.  Today's testing showed: negative to foods. No pumpkin skin testing available.   Start to avoid peanuts, tree nuts and pumpkins. See below for oral allergy syndrome.  Get pumpkin IgE bloodwork.  For mild symptoms you can take over the counter antihistamines such as Benadryl and monitor symptoms closely. If symptoms worsen or if you have severe symptoms including breathing issues, throat closure, significant swelling, whole body hives, severe diarrhea and vomiting, lightheadedness then seek immediate medical care.  Food action plan given.   Lab Orders     IgE Nut Prof. w/Component Rflx     Allergen, Pumpkin (f225) IgE  Pollen-food allergy  See above as for food allergies.  Discussed that her food triggered oral and throat symptoms are likely caused by oral food allergy syndrome (OFAS). This is caused by cross reactivity of pollen with fresh fruits and vegetables, and nuts. Symptoms are usually localized in the form of itching and burning in mouth and throat. Very rarely it can progress to more severe symptoms. Eating foods in cooked or processed forms usually minimizes symptoms. I recommended avoidance of eating the problem foods, especially during the peak season(s). Sometimes, OFAS can induce severe throat swelling or even a systemic reaction; with such instance, I advised them to report to a local ER. A list of common pollens and food cross-reactivities was provided to the patient.   Other allergic rhinitis Rhinoconjunctivitis symptoms only during the spring for the past few years.  Using eyedrops and Allegra with good benefit.  Today's skin testing showed: positive to grass, weed, trees, mold, dust mites, cat, dog, horse, mouse.  Discussed environmental control measures.  May  use over the counter antihistamines such as Zyrtec (cetirizine), Claritin (loratadine), Allegra (fexofenadine), or Xyzal (levocetirizine) daily as needed.  May use olopatadine eye drop 1 in each eye daily as needed for itchy eyes.  Mild intermittent asthma without complication Diagnosed with asthma as a child. Triggers are unknown. Has albuterol on hand at home if needed.Today's spirometry was normal.  May use albuterol rescue inhaler 2 puffs or nebulizer every 4 to 6 hours as needed for shortness of breath, chest tightness, coughing, and wheezing. May use albuterol rescue inhaler 2 puffs 5 to 15 minutes prior to strenuous physical activities.  Return in about 6 months (around 11/22/2018).  Reducing Pollen Exposure . Pollen seasons: trees (spring), grass (summer) and ragweed/weeds (fall). Marland Kitchen Keep windows closed in your home and car to lower pollen exposure.  Susa Simmonds air conditioning in the bedroom and throughout the house if possible.  . Avoid going out in dry windy days - especially early morning. . Pollen counts are highest between 5 - 10 AM and on dry, hot and windy days.  . Save outside activities for late afternoon or after a heavy rain, when pollen levels are lower.  . Avoid mowing of grass if you have grass pollen allergy. Marland Kitchen Be aware that pollen can also be transported indoors on people and pets.  . Dry your clothes in an automatic dryer rather than hanging them outside where they might collect pollen.  . Rinse hair and eyes before bedtime. Mold Control . Mold and fungi can grow on a variety of surfaces provided certain temperature and moisture conditions exist.  . Outdoor molds grow on plants, decaying vegetation and soil. The major outdoor mold, Alternaria and  Cladosporium, are found in very high numbers during hot and dry conditions. Generally, a late summer - fall peak is seen for common outdoor fungal spores. Rain will temporarily lower outdoor mold spore count, but counts rise  rapidly when the rainy period ends. . The most important indoor molds are Aspergillus and Penicillium. Dark, humid and poorly ventilated basements are ideal sites for mold growth. The next most common sites of mold growth are the bathroom and the kitchen. Outdoor (Seasonal) Mold Control . Use air conditioning and keep windows closed. . Avoid exposure to decaying vegetation. Marland Kitchen Avoid leaf raking. . Avoid grain handling. . Consider wearing a face mask if working in moldy areas.  Indoor (Perennial) Mold Control  . Maintain humidity below 50%. . Get rid of mold growth on hard surfaces with water, detergent and, if necessary, 5% bleach (do not mix with other cleaners). Then dry the area completely. If mold covers an area more than 10 square feet, consider hiring an indoor environmental professional. . For clothing, washing with soap and water is best. If moldy items cannot be cleaned and dried, throw them away. . Remove sources e.g. contaminated carpets. . Repair and seal leaking roofs or pipes. Using dehumidifiers in damp basements may be helpful, but empty the water and clean units regularly to prevent mildew from forming. All rooms, especially basements, bathrooms and kitchens, require ventilation and cleaning to deter mold and mildew growth. Avoid carpeting on concrete or damp floors, and storing items in damp areas. Pet Allergen Avoidance: . Contrary to popular opinion, there are no "hypoallergenic" breeds of dogs or cats. That is because people are not allergic to an animal's hair, but to an allergen found in the animal's saliva, dander (dead skin flakes) or urine. Pet allergy symptoms typically occur within minutes. For some people, symptoms can build up and become most severe 8 to 12 hours after contact with the animal. People with severe allergies can experience reactions in public places if dander has been transported on the pet owners' clothing. Marland Kitchen Keeping an animal outdoors is only a partial  solution, since homes with pets in the yard still have higher concentrations of animal allergens. . Before getting a pet, ask your allergist to determine if you are allergic to animals. If your pet is already considered part of your family, try to minimize contact and keep the pet out of the bedroom and other rooms where you spend a great deal of time. . As with dust mites, vacuum carpets often or replace carpet with a hardwood floor, tile or linoleum. . High-efficiency particulate air (HEPA) cleaners can reduce allergen levels over time. . While dander and saliva are the source of cat and dog allergens, urine is the source of allergens from rabbits, hamsters, mice and [place] pigs; so ask a non-allergic family member to clean the animal's cage. . If you have a pet allergy, talk to your allergist about the potential for allergy immunotherapy (allergy shots). This strategy can often provide long-term relief. Control of House Dust Mite Allergen . Dust mite allergens are a common trigger of allergy and asthma symptoms. While they can be found throughout the house, these microscopic creatures thrive in warm, humid environments such as bedding, upholstered furniture and carpeting. . Because so much time is spent in the bedroom, it is essential to reduce mite levels there.  . Encase pillows, mattresses, and box springs in special allergen-proof fabric covers or airtight, zippered plastic covers.  Kevin Fenton should be washed weekly  in hot water (130 F) and dried in a hot dryer. Allergen-proof covers are available for comforters and pillows that can't be regularly washed.  Wendee Copp the allergy-proof covers every few months. Minimize clutter in the bedroom. Keep pets out of the bedroom.  Marland Kitchen Keep humidity less than 50% by using a dehumidifier or air conditioning. You can buy a humidity measuring device called a hygrometer to monitor this.  . If possible, replace carpets with hardwood, linoleum, or washable area rugs.  If that's not possible, vacuum frequently with a vacuum that has a HEPA filter. . Remove all upholstered furniture and non-washable window drapes from the bedroom. . Remove all non-washable stuffed toys from the bedroom.  Wash stuffed toys weekly.

## 2018-05-23 NOTE — Assessment & Plan Note (Addendum)
Perioral pruritus after peanuts/tree nut ingestion. Symptoms resolve within 30-60 minutes after benadryl. Denies any other symptoms. Questionable reaction to pumpkin chocolate chip cake.  Today's testing showed: negative to foods. No pumpkin skin testing available.   Start to avoid peanuts, tree nuts and pumpkins. See below for oral allergy syndrome.  Get pumpkin IgE bloodwork.  For mild symptoms you can take over the counter antihistamines such as Benadryl and monitor symptoms closely. If symptoms worsen or if you have severe symptoms including breathing issues, throat closure, significant swelling, whole body hives, severe diarrhea and vomiting, lightheadedness then seek immediate medical care.  Food action plan given.   Lab Orders     IgE Nut Prof. w/Component Rflx     Allergen, Pumpkin (f225) IgE

## 2018-05-23 NOTE — Assessment & Plan Note (Addendum)
   See above as for food allergies.  Discussed that Alisha Reynolds food triggered oral and throat symptoms are likely caused by oral food allergy syndrome (OFAS). This is caused by cross reactivity of pollen with fresh fruits and vegetables, and nuts. Symptoms are usually localized in the form of itching and burning in mouth and throat. Very rarely it can progress to more severe symptoms. Eating foods in cooked or processed forms usually minimizes symptoms. I recommended avoidance of eating the problem foods, especially during the peak season(s). Sometimes, OFAS can induce severe throat swelling or even a systemic reaction; with such instance, I advised them to report to a local ER. A list of common pollens and food cross-reactivities was provided to the patient.

## 2018-05-25 LAB — IGE NUT PROF. W/COMPONENT RFLX
Brazil Nut IgE: <0.1 kU/L
F020-IgE Almond: <0.1 kU/L
F202-IgE Cashew Nut: <0.1 kU/L
F203-IgE Pistachio Nut: <0.1 kU/L
Hazelnut (Filbert) IgE: <0.1 kU/L
Macadamia Nut, IgE: <0.1 kU/L
Peanut, IgE: <0.1 kU/L
Pecan Nut IgE: <0.1 kU/L
Walnut IgE: <0.1 kU/L

## 2018-05-25 LAB — ALLERGEN, PUMPKIN (F225) IGE: Pumpkin IgE: <0.1 kU/L

## 2018-05-27 ENCOUNTER — Encounter: Payer: Self-pay | Admitting: Allergy

## 2018-06-08 ENCOUNTER — Institutional Professional Consult (permissible substitution): Payer: BC Managed Care – PPO | Admitting: Neurology
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
## 2018-06-21 ENCOUNTER — Ambulatory Visit: Payer: Self-pay | Admitting: Neurology

## 2018-06-28 ENCOUNTER — Other Ambulatory Visit (HOSPITAL_COMMUNITY): Payer: Self-pay | Admitting: Chiropractic Medicine

## 2018-06-28 DIAGNOSIS — M25551 Pain in right hip: Secondary | ICD-10-CM

## 2018-07-06 ENCOUNTER — Ambulatory Visit (HOSPITAL_COMMUNITY)
Admission: RE | Admit: 2018-07-06 | Discharge: 2018-07-06 | Disposition: A | Discharge status: Home / Self Care | Payer: BC Managed Care – PPO | Source: Ambulatory Visit | Attending: Chiropractic Medicine | Admitting: Chiropractic Medicine

## 2018-07-06 DIAGNOSIS — M25551 Pain in right hip: Secondary | ICD-10-CM

## 2018-07-06 MED ORDER — GADOBENATE DIMEGLUMINE 529 MG/ML IV SOLN
0.0100 mL | Freq: Once | INTRAVENOUS | Status: AC | PRN
  Administered 2018-07-06: 0.01 mL via INTRA_ARTICULAR

## 2018-07-06 MED ORDER — SODIUM CHLORIDE (PF) 0.9 % IJ SOLN
10.0000 mL | Freq: Once | INTRAMUSCULAR | Status: AC
  Administered 2018-07-06: 10 mL

## 2018-07-06 MED ORDER — IOPAMIDOL (ISOVUE-M 200) INJECTION 41%
10.0000 mL | Freq: Once | INTRAMUSCULAR | Status: AC
  Administered 2018-07-06: 10 mL via INTRA_ARTICULAR

## 2018-07-06 MED ORDER — LIDOCAINE HCL (PF) 1 % IJ SOLN
5.0000 mL | Freq: Once | INTRAMUSCULAR | Status: AC
  Administered 2018-07-06: 5 mL via INTRADERMAL

## 2018-07-27 ENCOUNTER — Emergency Department (HOSPITAL_COMMUNITY)
Admission: EM | Admit: 2018-07-27 | Discharge: 2018-07-28 | Disposition: A | Discharge status: Home / Self Care | Payer: BC Managed Care – PPO | Attending: Emergency Medicine | Admitting: Emergency Medicine

## 2018-07-27 ENCOUNTER — Other Ambulatory Visit: Payer: Self-pay

## 2018-07-27 ENCOUNTER — Encounter (HOSPITAL_COMMUNITY): Payer: Self-pay | Admitting: Obstetrics and Gynecology

## 2018-07-27 DIAGNOSIS — N898 Other specified noninflammatory disorders of vagina: Secondary | ICD-10-CM | POA: Diagnosis not present

## 2018-07-27 LAB — URINALYSIS, ROUTINE W REFLEX MICROSCOPIC
Bacteria, UA: NONE SEEN
Bilirubin Urine: NEGATIVE
Glucose, UA: NEGATIVE mg/dL
Hgb urine dipstick: NEGATIVE
Ketones, ur: NEGATIVE mg/dL
Nitrite: NEGATIVE
Protein, ur: NEGATIVE mg/dL
Specific Gravity, Urine: 1.015 (ref 1.005–1.030)
pH: 6 (ref 5.0–8.0)

## 2018-07-27 LAB — POC URINE PREG, ED: Preg Test, Ur: NEGATIVE

## 2018-07-27 NOTE — ED Triage Notes (Signed)
Per pt: Pt reports vaginal pain and discharge. Pt reports the dc is white but not chunky and has no odor. Pt reports it hurts when she urinates and that she has only had one sexual partner.  Pt denies any other symptoms.

## 2018-07-27 NOTE — ED Notes (Signed)
Pelvic cart at bedside and pt dressed into gown.

## 2018-07-28 LAB — WET PREP, GENITAL
Clue Cells Wet Prep HPF POC: NONE SEEN
Sperm: NONE SEEN
Trich, Wet Prep: NONE SEEN
WBC, Wet Prep HPF POC: NONE SEEN
Yeast Wet Prep HPF POC: NONE SEEN

## 2018-07-28 MED ORDER — METRONIDAZOLE 0.75 % VA GEL: 1.0000 | Freq: Two times a day (BID) | VAGINAL | 0 refills | Status: DC

## 2018-07-28 MED ORDER — FLUCONAZOLE 150 MG PO TABS: 150.0000 mg | ORAL_TABLET | Freq: Every day | ORAL | 0 refills | Status: DC

## 2018-07-28 NOTE — Discharge Instructions (Addendum)
Follow up with your OB/GYN for recheck in 10 days.
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
## 2018-07-29 ENCOUNTER — Encounter (HOSPITAL_COMMUNITY): Payer: Self-pay | Admitting: *Deleted

## 2018-07-29 ENCOUNTER — Inpatient Hospital Stay (HOSPITAL_COMMUNITY)
Admission: AD | Admit: 2018-07-29 | Discharge: 2018-07-29 | Disposition: A | Discharge status: Home / Self Care | Payer: BC Managed Care – PPO | Attending: Obstetrics & Gynecology | Admitting: Obstetrics & Gynecology

## 2018-07-29 DIAGNOSIS — R102 Pelvic and perineal pain: Secondary | ICD-10-CM | POA: Diagnosis not present

## 2018-07-29 DIAGNOSIS — N898 Other specified noninflammatory disorders of vagina: Secondary | ICD-10-CM

## 2018-07-29 LAB — WET PREP, GENITAL
Clue Cells Wet Prep HPF POC: NONE SEEN
Trich, Wet Prep: NONE SEEN
Yeast Wet Prep HPF POC: NONE SEEN

## 2018-07-29 LAB — GC/CHLAMYDIA PROBE AMP (~~LOC~~) NOT AT ARMC
Chlamydia: NEGATIVE
Neisseria Gonorrhea: NEGATIVE

## 2018-07-29 MED ORDER — HYDROCORTISONE 2.5 % EX LOTN: TOPICAL_LOTION | Freq: Two times a day (BID) | CUTANEOUS | 0 refills | Status: DC

## 2018-07-29 MED ORDER — FLUCONAZOLE 150 MG PO TABS: 150.0000 mg | ORAL_TABLET | Freq: Every day | ORAL | 0 refills | Status: AC

## 2018-07-29 NOTE — MAU Note (Addendum)
SAYS FOR 3 DAYS  SHE HAS WHITE D/C  AND BURNS WITH VOIDING .   WENT TO [facility] LAST NIGHT - DX WITH BV AND YEAST. WAS FEELING BETTER  THEN    HAD SEX TODAY  - HURT .  SAYS 1 WEEK AGO STARTED TAKING  AZO
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
## 2018-09-20 ENCOUNTER — Ambulatory Visit (INDEPENDENT_AMBULATORY_CARE_PROVIDER_SITE_OTHER): Payer: BC Managed Care – PPO | Admitting: Family Medicine

## 2018-09-20 ENCOUNTER — Encounter (INDEPENDENT_AMBULATORY_CARE_PROVIDER_SITE_OTHER): Payer: Self-pay | Admitting: Family Medicine

## 2018-09-20 DIAGNOSIS — M25551 Pain in right hip: Secondary | ICD-10-CM | POA: Diagnosis not present

## 2018-09-20 DIAGNOSIS — Y92410 Unspecified street and highway as the place of occurrence of the external cause: Secondary | ICD-10-CM | POA: Diagnosis not present

## 2018-09-20 DIAGNOSIS — G8929 Other chronic pain: Secondary | ICD-10-CM | POA: Diagnosis not present

## 2018-09-20 MED ORDER — LIDOCAINE HCL 1 % IJ SOLN: 10.0000 mL | Freq: Once | INTRAMUSCULAR | Status: DC
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
## 2018-09-21 ENCOUNTER — Other Ambulatory Visit (INDEPENDENT_AMBULATORY_CARE_PROVIDER_SITE_OTHER): Payer: Self-pay | Admitting: Family Medicine

## 2018-09-21 DIAGNOSIS — M25551 Pain in right hip: Secondary | ICD-10-CM

## 2018-10-02 DIAGNOSIS — S73111A Iliofemoral ligament sprain of right hip, initial encounter: Secondary | ICD-10-CM | POA: Insufficient documentation

## 2018-11-24 DIAGNOSIS — D649 Anemia, unspecified: Secondary | ICD-10-CM | POA: Insufficient documentation

## 2018-11-24 DIAGNOSIS — Z8701 Personal history of pneumonia (recurrent): Secondary | ICD-10-CM | POA: Insufficient documentation

## 2019-01-02 ENCOUNTER — Inpatient Hospital Stay (HOSPITAL_COMMUNITY)
Admission: AD | Admit: 2019-01-02 | Discharge: 2019-01-02 | Disposition: A | Discharge status: Home / Self Care | Payer: BC Managed Care – PPO | Attending: Obstetrics and Gynecology | Admitting: Obstetrics and Gynecology

## 2019-01-02 ENCOUNTER — Other Ambulatory Visit: Payer: Self-pay

## 2019-01-02 DIAGNOSIS — Z3202 Encounter for pregnancy test, result negative: Secondary | ICD-10-CM | POA: Insufficient documentation

## 2019-01-02 DIAGNOSIS — R102 Pelvic and perineal pain: Secondary | ICD-10-CM | POA: Diagnosis not present

## 2019-01-02 LAB — URINALYSIS, ROUTINE W REFLEX MICROSCOPIC
Bilirubin Urine: NEGATIVE
Glucose, UA: NEGATIVE mg/dL
Hgb urine dipstick: NEGATIVE
Ketones, ur: NEGATIVE mg/dL
Leukocytes,Ua: NEGATIVE
Nitrite: NEGATIVE
Protein, ur: NEGATIVE mg/dL
Specific Gravity, Urine: 1.004 — ABNORMAL LOW (ref 1.005–1.030)
pH: 6 (ref 5.0–8.0)

## 2019-01-02 LAB — HCG, SERUM, QUALITATIVE: Preg, Serum: NEGATIVE

## 2019-01-02 LAB — POCT PREGNANCY, URINE: Preg Test, Ur: NEGATIVE

## 2019-01-02 NOTE — MAU Note (Signed)
Pt reports 3 positive pregnancy test and 1 negative pregnancy test at home. Pt here for confirmation of LMP: 11/21/2018. Pt reports right sided "ovary" pain that started this morning. Rates 3 out of 10. Took ibuprofen at 10am, which helped some. Denies vaginal bleeding.

## 2019-01-02 NOTE — MAU Provider Note (Addendum)
  S Ms. Alisha Reynolds is a 23 y.o. G84P0010 female who presents to MAU today with complaint of +HPT x3 and right sided pelvic pain.   O BP 137/72 (BP Location: Right Arm)   Pulse 97   Temp 98.3 F (36.8 C)   Resp 16   Ht 5\' 1"  (1.549 m)   Wt 69.7 kg   LMP 11/21/2018   SpO2 100%   BMI 29.02 kg/m  Physical Exam  Constitutional: She is oriented to person, place, and time. She appears well-developed and well-nourished. No distress.  HENT:  Head: Normocephalic and atraumatic.  Neck: Normal range of motion.  Respiratory: Effort normal. No respiratory distress.  Musculoskeletal: Normal range of motion.  Neurological: She is alert and oriented to person, place, and time.  Psychiatric: She has a normal mood and affect.   Results for orders placed or performed during the hospital encounter of 01/02/19 (from the past 24 hour(s))  Urinalysis, Routine w reflex microscopic     Status: Abnormal   Collection Time: 01/02/19  9:14 PM  Result Value Ref Range   Color, Urine COLORLESS (A) YELLOW   APPearance CLEAR CLEAR   Specific Gravity, Urine 1.004 (L) 1.005 - 1.030   pH 6.0 5.0 - 8.0   Glucose, UA NEGATIVE NEGATIVE mg/dL   Hgb urine dipstick NEGATIVE NEGATIVE   Bilirubin Urine NEGATIVE NEGATIVE   Ketones, ur NEGATIVE NEGATIVE mg/dL   Protein, ur NEGATIVE NEGATIVE mg/dL   Nitrite NEGATIVE NEGATIVE   Leukocytes,Ua NEGATIVE NEGATIVE  Pregnancy, urine POC     Status: None   Collection Time: 01/02/19  9:15 PM  Result Value Ref Range   Preg Test, Ur NEGATIVE NEGATIVE  hCG, serum, qualitative     Status: None   Collection Time: 01/02/19  9:43 PM  Result Value Ref Range   Preg, Serum NEGATIVE NEGATIVE   A Non pregnant female Medical screening exam complete  P Discharge from MAU in stable condition Patient given the option of transfer to [facility] for further evaluation or seek care in outpatient facility of choice- she prefers to f/u with her primary [place] Patient may  return to MAU as needed for pregnancy related complaints  Julianne Handler, [place] 01/02/2019 10:46 PM

## 2019-02-28 ENCOUNTER — Other Ambulatory Visit: Payer: Self-pay | Admitting: Obstetrics and Gynecology

## 2019-02-28 DIAGNOSIS — E079 Disorder of thyroid, unspecified: Secondary | ICD-10-CM

## 2019-03-08 ENCOUNTER — Other Ambulatory Visit: Payer: BC Managed Care – PPO

## 2019-03-14 ENCOUNTER — Ambulatory Visit
Admission: RE | Admit: 2019-03-14 | Discharge: 2019-03-14 | Disposition: A | Discharge status: Home / Self Care | Payer: BC Managed Care – PPO | Source: Ambulatory Visit | Attending: Obstetrics and Gynecology | Admitting: Obstetrics and Gynecology

## 2019-03-14 DIAGNOSIS — E079 Disorder of thyroid, unspecified: Secondary | ICD-10-CM

## 2019-03-20 ENCOUNTER — Other Ambulatory Visit: Payer: Self-pay

## 2019-03-20 DIAGNOSIS — Z20822 Contact with and (suspected) exposure to covid-19: Secondary | ICD-10-CM

## 2019-03-21 LAB — NOVEL CORONAVIRUS, NAA: SARS-CoV-2, NAA: NOT DETECTED

## 2019-03-22 ENCOUNTER — Emergency Department (HOSPITAL_COMMUNITY)
Admission: EM | Admit: 2019-03-22 | Discharge: 2019-03-22 | Disposition: A | Discharge status: Home / Self Care | Payer: BC Managed Care – PPO | Attending: Emergency Medicine | Admitting: Emergency Medicine

## 2019-03-22 ENCOUNTER — Other Ambulatory Visit: Payer: Self-pay

## 2019-03-22 DIAGNOSIS — Z79899 Other long term (current) drug therapy: Secondary | ICD-10-CM | POA: Diagnosis not present

## 2019-03-22 DIAGNOSIS — J029 Acute pharyngitis, unspecified: Secondary | ICD-10-CM | POA: Diagnosis present

## 2019-03-22 DIAGNOSIS — J45909 Unspecified asthma, uncomplicated: Secondary | ICD-10-CM | POA: Diagnosis not present

## 2019-03-22 LAB — GROUP A STREP BY PCR: Group A Strep by PCR: NOT DETECTED

## 2019-03-22 MED ORDER — ACETAMINOPHEN 325 MG PO TABS
650.0000 mg | ORAL_TABLET | Freq: Once | ORAL | Status: AC | PRN
  Administered 2019-03-22: 650 mg via ORAL
  Filled 2019-03-22: qty 2
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
## 2019-03-22 NOTE — Discharge Instructions (Addendum)
Your strep test is negative today. Please read instructions below.  You can take ibuprofen every 6 hours as needed for sore throat or fever.  You can alternate with tylenol. Drink plenty of water.  Use saline nasal spray for congestion. Follow up with your primary care provider as needed.  Return to the ER for inability to swallow liquids, difficulty breathing, or new or worsening symptoms.

## 2019-03-22 NOTE — ED Triage Notes (Signed)
Per pt since Monday she has been having sore throat, body aches, chills and fevers. WAS swabbed on Monday and got results yesterday and was negative. Pt said not tested for strep. Pt said her throat is very sore. Taking tylenol for fevers and body aches.

## 2019-05-03 ENCOUNTER — Encounter: Payer: Self-pay | Admitting: Family Medicine

## 2019-05-03 ENCOUNTER — Ambulatory Visit: Payer: Self-pay

## 2019-05-03 ENCOUNTER — Ambulatory Visit (INDEPENDENT_AMBULATORY_CARE_PROVIDER_SITE_OTHER): Payer: BC Managed Care – PPO | Admitting: Family Medicine

## 2019-05-03 DIAGNOSIS — M25551 Pain in right hip: Secondary | ICD-10-CM

## 2019-05-03 NOTE — Addendum Note (Signed)
Addended by: Hortencia Pilar on: 05/03/2019 12:08 PM   Modules accepted: Orders
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
## 2019-05-05 ENCOUNTER — Other Ambulatory Visit: Payer: Self-pay

## 2019-05-05 ENCOUNTER — Ambulatory Visit (INDEPENDENT_AMBULATORY_CARE_PROVIDER_SITE_OTHER): Payer: BC Managed Care – PPO | Admitting: Family Medicine

## 2019-05-05 ENCOUNTER — Encounter: Payer: Self-pay | Admitting: Family Medicine

## 2019-05-05 ENCOUNTER — Telehealth: Payer: Self-pay | Admitting: Family Medicine

## 2019-05-05 VITALS — BP 118/70 | HR 98 | Temp 97.7°F | Resp 16 | Ht 62.0 in | Wt 148.6 lb

## 2019-05-05 DIAGNOSIS — T781XXD Other adverse food reactions, not elsewhere classified, subsequent encounter: Secondary | ICD-10-CM | POA: Diagnosis not present

## 2019-05-05 DIAGNOSIS — J4520 Mild intermittent asthma, uncomplicated: Secondary | ICD-10-CM | POA: Diagnosis not present

## 2019-05-05 DIAGNOSIS — L236 Allergic contact dermatitis due to food in contact with the skin: Secondary | ICD-10-CM | POA: Diagnosis not present

## 2019-05-05 DIAGNOSIS — J3089 Other allergic rhinitis: Secondary | ICD-10-CM | POA: Diagnosis not present

## 2019-05-05 DIAGNOSIS — L259 Unspecified contact dermatitis, unspecified cause: Secondary | ICD-10-CM | POA: Insufficient documentation

## 2019-05-05 MED ORDER — DESONIDE 0.05 % EX OINT: 1.0000 "application " | TOPICAL_OINTMENT | Freq: Two times a day (BID) | CUTANEOUS | 5 refills | Status: DC

## 2019-05-05 MED ORDER — EPINEPHRINE 0.3 MG/0.3ML IJ SOAJ: 0.3000 mg | Freq: Once | INTRAMUSCULAR | 1 refills | Status: DC | PRN

## 2019-05-05 MED ORDER — PREDNISONE 10 MG PO TABS: ORAL_TABLET | ORAL | 0 refills | Status: AC

## 2019-05-05 NOTE — Telephone Encounter (Signed)
Patient was seen this morning, 05-05-19. She was prescribed prednisone, but she said the pharmacy did not received it. They received her other prescription, but not this one. [facility].

## 2019-05-05 NOTE — Addendum Note (Signed)
Addended by: Lucrezia Starch I on: 05/05/2019 02:14 PM   Modules accepted: Orders

## 2019-05-05 NOTE — Telephone Encounter (Signed)
Prednisone was sent in as instructed by provider. I left a message advising the patient of this.

## 2019-05-05 NOTE — Progress Notes (Addendum)
[address] [place] [address] Dept: [phone]  FOLLOW UP NOTE  Patient ID: Alisha Reynolds, female    DOB: 10/07/1995  Age: 23 y.o. MRN: [id number] Date of Office Visit: 05/05/2019  Assessment  Chief Complaint: Food Intolerance (ate pesto pasta, had a bad reaction)  HPI Alisha Reynolds is a 23 year old female who presents to the clinic for a follow up viit. She was last seen in this clinic on 05/23/2018 by Dr. Maudie Mercury for evaluation of asthma, allergic rhinitis, and possible food allergies. At today's visit, she reports that about 3 weeks ago she ate a pesto pasta and within about 10-15 minutes she began to experience lip redness, swelling, and itching. A few minutes laste she began to notice these symptoms on the left side of her chin from her lip to the bottom of her chin. She reports that she took Benadryl at that time with slight relief of her symptoms. She denies experiencing concomitent cardiopulmonary or gastrointestinal symptoms at that time. She reports that, over the last 2-3  weeks, the skin around her lips and the left side of her chin has been red, and dry with cycles of partial remission and flare. She reports asthma as well controlled with no shortness of breath, coguh, or wheeze with activity or rest. She is currently using her albuterol about once a week during pollen season only. Allergic rhinitis is reported as well controlled with cetirizine 10 mg once a day. She is not currently using and nasal sprays or saline rinses.  Her current medications are listed in the chart.    Drug Allergies:  Allergies  Allergen Reactions  . Other Anaphylaxis    All types of nuts    Physical Exam: BP 118/70 (BP Location: Left Arm, Patient Position: Sitting, Cuff Size: Normal)   Pulse 98   Temp 97.7 F (36.5 C) (Temporal)   Resp 16   Ht 5\' 2"  (1.575 m)   Wt 148 lb 9.6 oz (67.4 kg)   SpO2 98%   BMI 27.18 kg/m    Physical Exam Vitals signs reviewed.  Constitutional:    Appearance: Normal appearance.  HENT:     Head: Normocephalic and atraumatic.     Right Ear: Tympanic membrane normal.     Left Ear: Tympanic membrane normal.     Nose:     Comments: Bilateral nares slightly erythematous with no nasal drainage noted. Pharynx normal. Ears normal eyes normal    Mouth/Throat:     Pharynx: Oropharynx is clear.  Eyes:     Conjunctiva/sclera: Conjunctivae normal.  Neck:     Musculoskeletal: Normal range of motion and neck supple.  Cardiovascular:     Rate and Rhythm: Normal rate and regular rhythm.     Heart sounds: Normal heart sounds. No murmur.  Pulmonary:     Effort: Pulmonary effort is normal.     Breath sounds: Normal breath sounds.     Comments: Lungs clear to auscultation Musculoskeletal: Normal range of motion.  Skin:    General: Skin is warm.     Comments: Dry peeling skin around lips and on the left side of her chin. No open areas or drainage.   Neurological:     Mental Status: She is alert and oriented to person, place, and time.  Psychiatric:        Mood and Affect: Mood normal.        Behavior: Behavior normal.        Thought Content:  Thought content normal.        Judgment: Judgment normal.     Diagnostics: FVC 3.13, FEV1 2.53. Predicted FVC 3.03, FEV1 2.67. Spirometry indicates normal ventilatory function.   Assessment and Plan: 1. Allergic contact dermatitis due to food in contact with skin   2. Mild intermittent asthma without complication   3. Adverse food reaction, subsequent encounter   4. Other allergic rhinitis   5. Pollen-food allergy, subsequent encounter     Meds ordered this encounter  Medications  . desonide (DESOWEN) 0.05 % ointment    Sig: Apply 1 application topically 2 (two) times daily.    Dispense:  60 g    Refill:  5  . predniSONE (DELTASONE) 10 MG tablet    Sig: Take 1 tablet (10 mg total) by mouth 2 (two) times daily for 4 days, THEN 1 tablet (10 mg total) daily for 1 day.    Dispense:  9 tablet     Refill:  0    Patient Instructions  Allergic contact dermatitis After washing your face with a fragrance free and dye free cleanser, pat dry and apply desonide 0.05% to red, itchy areas twice a day as needed. Wait about 10 minutes and apply a thick moisturizing cream such as CeraVe, Eucerin, Aveeno, or Cetiphil. Begin prednisone 10 mg tablets. Take 2 tablets once a day for the next 4 days, then take 1 tablet on the 5th day, then stop If your symptoms re-occur, begin a journal of events that occurred for up to 6 hours before your symptoms began including foods and beverages consumed, soaps or perfumes you had contact with, and medications.  Consider patch testing. Patches are placed on Monday, removed on Wednesday with a first reading, and a final reading on Friday.  Asthma Continue albuterol 2 puffs every 4 hours as needed for cough or wheeze  Allergic rhinitis Continue an over the counter antihistamine as needed for a runny nose or itch Consider saline nasal rinses as needed for nasal symptoms. Use this before any medicated nasal sprays for best result  Food allergy Continue to avoid tree nuts, and peanuts. For mild symptoms you can take over the counter antihistamines such as Benadryl and monitor symptoms closely. If symptoms worsen or if you have severe symptoms including breathing issues, throat closure, significant swelling, whole body hives, severe diarrhea and vomiting, lightheadedness then seek immediate medical care and use epinephrine device.   Pollen food syndrome Continue to avoid foods that irritate your mouth and skin. The oral allergy syndrome (OAS) or pollen-food allergy syndrome (PFAS) is a relatively common form of food allergy, particularly in adults. It typically occurs in people who have pollen allergies when the immune system "sees" proteins on the food that look like proteins on the pollen. This results in the allergy antibody (IgE) binding to the food instead of the  pollen. Patients typically report itching and/or mild swelling of the mouth and throat immediately following ingestion of certain uncooked fruits (including nuts) or raw vegetables. Only a very small number of affected individuals experience systemic allergic reactions, such as anaphylaxis which occurs with true food allergies.    Call the clinic if this treatment plan is not working well for you  Follow up in 3 months or sooner if needed   Thank you for the opportunity to care for this patient.  Please do not hesitate to contact me with questions.  Gareth Morgan, FNP [facility] [place]: I  reviewed the Nurse Practitioner's note and agree with the documented findings and plan of care. We discussed the patient and developed a plan concurrently.   Prudy Feeler, MD [facility]

## 2019-05-05 NOTE — Patient Instructions (Addendum)
Eczema After washing your face with a fragrance free and dye free cleanser, pat dry and apply desonide 0.05% to red, itchy areas twice a day as needed. Wait about 10 minutes and apply a thick moisturizing cream such as CeraVe, Eucerin, Aveeno, or Cetiphil. Begin prednisone 10 mg tablets. Take 2 tablets once a day for the next 4 days, then take 1 tablet on the 5th day, then stop If your symptoms re-occur, begin a journal of events that occurred for up to 6 hours before your symptoms began including foods and beverages consumed, soaps or perfumes you had contact with, and medications.  Consider patch testing. Patches are placed on Monday, removed on Wednesday with a first reading, and a final reading on Friday.  Asthma Continue albuterol 2 puffs every 4 hours as needed for cough or wheeze  Allergic rhinitis Continue an over the counter antihistamine as needed for a runny nose or itch Consider saline nasal rinses as needed for nasal symptoms. Use this before any medicated nasal sprays for best result  Food allergy Continue to avoid tree nuts, and peanuts. For mild symptoms you can take over the counter antihistamines such as Benadryl and monitor symptoms closely. If symptoms worsen or if you have severe symptoms including breathing issues, throat closure, significant swelling, whole body hives, severe diarrhea and vomiting, lightheadedness then seek immediate medical care  Pollen syndrome Continue to avoid foods that irritate your mouth and skin. The oral allergy syndrome (OAS) or pollen-food allergy syndrome (PFAS) is a relatively common form of food allergy, particularly in adults. It typically occurs in people who have pollen allergies when the immune system "sees" proteins on the food that look like proteins on the pollen. This results in the allergy antibody (IgE) binding to the food instead of the pollen. Patients typically report itching and/or mild swelling of the mouth and throat immediately  following ingestion of certain uncooked fruits (including nuts) or raw vegetables. Only a very small number of affected individuals experience systemic allergic reactions, such as anaphylaxis which occurs with true food allergies.    Call the clinic if this treatment plan is not working well for you  Follow up in 3 months or sooner if needed

## 2019-05-09 ENCOUNTER — Telehealth: Payer: Self-pay | Admitting: Family Medicine

## 2019-05-09 ENCOUNTER — Other Ambulatory Visit: Payer: Self-pay

## 2019-05-09 DIAGNOSIS — M25551 Pain in right hip: Secondary | ICD-10-CM

## 2019-05-09 NOTE — Telephone Encounter (Signed)
I called the patient and advised her we have put the order in for [facility]. She is fine with the location. I told her to expect a call from them to schedule the appointment.

## 2019-05-09 NOTE — Telephone Encounter (Signed)
Patient called wanting to know where Dr. Junius Roads sent her referral for her MRI.  CB#[phone].  Thank you.

## 2019-05-24 ENCOUNTER — Other Ambulatory Visit: Payer: Self-pay

## 2019-05-24 ENCOUNTER — Telehealth: Payer: Self-pay | Admitting: Family Medicine

## 2019-05-24 ENCOUNTER — Ambulatory Visit
Admission: RE | Admit: 2019-05-24 | Discharge: 2019-05-24 | Disposition: A | Discharge status: Home / Self Care | Payer: BC Managed Care – PPO | Source: Ambulatory Visit | Attending: Family Medicine | Admitting: Family Medicine

## 2019-05-24 DIAGNOSIS — M25551 Pain in right hip: Secondary | ICD-10-CM

## 2019-05-24 DIAGNOSIS — Z20822 Contact with and (suspected) exposure to covid-19: Secondary | ICD-10-CM

## 2019-05-24 NOTE — Telephone Encounter (Signed)
Hip MRI looks good, no obvious problems seen in the hip joint.  I would suggest a trial of physical therapy.  If that doesn't help, then possibly prolotherapy injections with dextrose.

## 2019-05-25 LAB — NOVEL CORONAVIRUS, NAA: SARS-CoV-2, NAA: NOT DETECTED

## 2019-05-29 ENCOUNTER — Encounter: Payer: Self-pay | Admitting: Family Medicine

## 2019-05-29 ENCOUNTER — Ambulatory Visit (INDEPENDENT_AMBULATORY_CARE_PROVIDER_SITE_OTHER): Payer: BC Managed Care – PPO | Admitting: Family Medicine

## 2019-05-29 ENCOUNTER — Other Ambulatory Visit: Payer: Self-pay

## 2019-05-29 DIAGNOSIS — M25551 Pain in right hip: Secondary | ICD-10-CM | POA: Diagnosis not present
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
## 2019-06-28 ENCOUNTER — Other Ambulatory Visit: Payer: Self-pay

## 2019-06-28 DIAGNOSIS — Z20822 Contact with and (suspected) exposure to covid-19: Secondary | ICD-10-CM

## 2019-06-30 LAB — NOVEL CORONAVIRUS, NAA: SARS-CoV-2, NAA: NOT DETECTED

## 2019-07-13 ENCOUNTER — Other Ambulatory Visit: Payer: BC Managed Care – PPO

## 2019-07-16 DIAGNOSIS — M25351 Other instability, right hip: Secondary | ICD-10-CM | POA: Insufficient documentation

## 2019-09-29 ENCOUNTER — Telehealth: Payer: Self-pay | Admitting: Neurology
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
## 2019-10-04 ENCOUNTER — Other Ambulatory Visit: Payer: Self-pay | Admitting: Family Medicine

## 2019-10-04 ENCOUNTER — Ambulatory Visit
Admission: RE | Admit: 2019-10-04 | Discharge: 2019-10-04 | Disposition: A | Discharge status: Home / Self Care | Payer: BC Managed Care – PPO | Source: Ambulatory Visit | Attending: Family Medicine | Admitting: Family Medicine

## 2019-10-04 DIAGNOSIS — M25531 Pain in right wrist: Secondary | ICD-10-CM

## 2019-10-21 DIAGNOSIS — Q6589 Other specified congenital deformities of hip: Secondary | ICD-10-CM | POA: Insufficient documentation

## 2019-10-30 ENCOUNTER — Other Ambulatory Visit: Payer: BC Managed Care – PPO

## 2020-01-14 ENCOUNTER — Inpatient Hospital Stay (HOSPITAL_COMMUNITY)
Admission: AD | Admit: 2020-01-14 | Discharge: 2020-01-16 | DRG: 885 | Disposition: A | Discharge status: Home / Self Care | Payer: BC Managed Care – PPO | Source: Intra-hospital | Attending: Psychiatry | Admitting: Psychiatry

## 2020-01-14 ENCOUNTER — Other Ambulatory Visit: Payer: Self-pay

## 2020-01-14 ENCOUNTER — Encounter (HOSPITAL_COMMUNITY): Payer: Self-pay | Admitting: Emergency Medicine

## 2020-01-14 ENCOUNTER — Encounter (HOSPITAL_COMMUNITY): Payer: Self-pay | Admitting: Psychiatry

## 2020-01-14 ENCOUNTER — Emergency Department (EMERGENCY_DEPARTMENT_HOSPITAL)
Admission: EM | Admit: 2020-01-14 | Discharge: 2020-01-14 | Disposition: A | Discharge status: Other Acute Inpatient Hospital | Payer: BC Managed Care – PPO | Source: Home / Self Care | Attending: Emergency Medicine | Admitting: Emergency Medicine

## 2020-01-14 DIAGNOSIS — R000 Tachycardia, unspecified: Secondary | ICD-10-CM | POA: Diagnosis present

## 2020-01-14 DIAGNOSIS — Z915 Personal history of self-harm: Secondary | ICD-10-CM

## 2020-01-14 DIAGNOSIS — F10920 Alcohol use, unspecified with intoxication, uncomplicated: Secondary | ICD-10-CM

## 2020-01-14 DIAGNOSIS — F10120 Alcohol abuse with intoxication, uncomplicated: Secondary | ICD-10-CM | POA: Insufficient documentation

## 2020-01-14 DIAGNOSIS — F332 Major depressive disorder, recurrent severe without psychotic features: Secondary | ICD-10-CM | POA: Diagnosis present

## 2020-01-14 DIAGNOSIS — F10129 Alcohol abuse with intoxication, unspecified: Secondary | ICD-10-CM | POA: Diagnosis present

## 2020-01-14 DIAGNOSIS — R45851 Suicidal ideations: Secondary | ICD-10-CM

## 2020-01-14 DIAGNOSIS — Z79899 Other long term (current) drug therapy: Secondary | ICD-10-CM

## 2020-01-14 DIAGNOSIS — T391X4A Poisoning by 4-Aminophenol derivatives, undetermined, initial encounter: Secondary | ICD-10-CM

## 2020-01-14 DIAGNOSIS — Y904 Blood alcohol level of 80-99 mg/100 ml: Secondary | ICD-10-CM | POA: Insufficient documentation

## 2020-01-14 DIAGNOSIS — Z792 Long term (current) use of antibiotics: Secondary | ICD-10-CM | POA: Insufficient documentation

## 2020-01-14 DIAGNOSIS — J4520 Mild intermittent asthma, uncomplicated: Secondary | ICD-10-CM | POA: Diagnosis present

## 2020-01-14 DIAGNOSIS — T391X2A Poisoning by 4-Aminophenol derivatives, intentional self-harm, initial encounter: Secondary | ICD-10-CM | POA: Diagnosis not present

## 2020-01-14 DIAGNOSIS — Z20822 Contact with and (suspected) exposure to covid-19: Secondary | ICD-10-CM | POA: Diagnosis present

## 2020-01-14 DIAGNOSIS — F4320 Adjustment disorder, unspecified: Secondary | ICD-10-CM | POA: Diagnosis present

## 2020-01-14 DIAGNOSIS — R112 Nausea with vomiting, unspecified: Secondary | ICD-10-CM | POA: Insufficient documentation

## 2020-01-14 DIAGNOSIS — J45909 Unspecified asthma, uncomplicated: Secondary | ICD-10-CM | POA: Insufficient documentation

## 2020-01-14 DIAGNOSIS — G4700 Insomnia, unspecified: Secondary | ICD-10-CM | POA: Diagnosis present

## 2020-01-14 DIAGNOSIS — F411 Generalized anxiety disorder: Secondary | ICD-10-CM | POA: Diagnosis present

## 2020-01-14 LAB — CBC
HCT: 36.6 % (ref 36.0–46.0)
Hemoglobin: 11.3 g/dL — ABNORMAL LOW (ref 12.0–15.0)
MCH: 27.1 pg (ref 26.0–34.0)
MCHC: 30.9 g/dL (ref 30.0–36.0)
MCV: 87.8 fL (ref 80.0–100.0)
Platelets: 283 10*3/uL (ref 150–400)
RBC: 4.17 MIL/uL (ref 3.87–5.11)
RDW: 15.9 % — ABNORMAL HIGH (ref 11.5–15.5)
WBC: 6.6 10*3/uL (ref 4.0–10.5)
nRBC: 0 % (ref 0.0–0.2)

## 2020-01-14 LAB — COMPREHENSIVE METABOLIC PANEL
ALT: 24 U/L (ref 0–44)
AST: 21 U/L (ref 15–41)
Albumin: 4.7 g/dL (ref 3.5–5.0)
Alkaline Phosphatase: 126 U/L (ref 38–126)
Anion gap: 11 (ref 5–15)
BUN: 12 mg/dL (ref 6–20)
CO2: 25 mmol/L (ref 22–32)
Calcium: 9.3 mg/dL (ref 8.9–10.3)
Chloride: 108 mmol/L (ref 98–111)
Creatinine, Ser: 0.73 mg/dL (ref 0.44–1.00)
GFR calc Af Amer: >60 mL/min (ref >60)
GFR calc non Af Amer: >60 mL/min (ref >60)
Glucose, Bld: 105 mg/dL — ABNORMAL HIGH (ref 70–99)
Potassium: 3.7 mmol/L (ref 3.5–5.1)
Sodium: 144 mmol/L (ref 135–145)
Total Bilirubin: 0.5 mg/dL (ref 0.3–1.2)
Total Protein: 8 g/dL (ref 6.5–8.1)

## 2020-01-14 LAB — RAPID URINE DRUG SCREEN, HOSP PERFORMED
Amphetamines: NOT DETECTED
Barbiturates: NOT DETECTED
Benzodiazepines: NOT DETECTED
Cocaine: NOT DETECTED
Opiates: NOT DETECTED
Tetrahydrocannabinol: NOT DETECTED

## 2020-01-14 LAB — ACETAMINOPHEN LEVEL
Acetaminophen (Tylenol), Serum: <10 ug/mL — ABNORMAL LOW (ref 10–30)
Acetaminophen (Tylenol), Serum: <10 ug/mL — ABNORMAL LOW (ref 10–30)

## 2020-01-14 LAB — ETHANOL: Alcohol, Ethyl (B): 80 mg/dL — ABNORMAL HIGH (ref <10)

## 2020-01-14 LAB — SARS CORONAVIRUS 2 BY RT PCR (HOSPITAL ORDER, PERFORMED IN ~~LOC~~ HOSPITAL LAB): SARS Coronavirus 2: NEGATIVE

## 2020-01-14 LAB — SALICYLATE LEVEL: Salicylate Lvl: <7 mg/dL — ABNORMAL LOW (ref 7.0–30.0)

## 2020-01-14 LAB — PROTIME-INR
INR: 1 (ref 0.8–1.2)
Prothrombin Time: 12.4 seconds (ref 11.4–15.2)

## 2020-01-14 LAB — PREGNANCY, URINE: Preg Test, Ur: NEGATIVE

## 2020-01-14 MED ORDER — HYDROXYZINE HCL 25 MG PO TABS
25.0000 mg | ORAL_TABLET | Freq: Four times a day (QID) | ORAL | Status: DC | PRN
  Administered 2020-01-15 (×2): 25 mg via ORAL
  Filled 2020-01-14 (×2): qty 1

## 2020-01-14 MED ORDER — FOLIC ACID 1 MG PO TABS
1.0000 mg | ORAL_TABLET | Freq: Every day | ORAL | Status: DC
  Administered 2020-01-15 – 2020-01-16 (×2): 1 mg via ORAL
  Filled 2020-01-14 (×3): qty 1

## 2020-01-14 MED ORDER — THIAMINE HCL 100 MG PO TABS
100.0000 mg | ORAL_TABLET | Freq: Every day | ORAL | Status: DC
  Filled 2020-01-14: qty 1

## 2020-01-14 MED ORDER — TRAZODONE HCL 50 MG PO TABS: 50.0000 mg | ORAL_TABLET | Freq: Every evening | ORAL | Status: DC | PRN

## 2020-01-14 MED ORDER — ONDANSETRON HCL 4 MG/2ML IJ SOLN
4.0000 mg | Freq: Once | INTRAMUSCULAR | Status: AC
  Administered 2020-01-14: 4 mg via INTRAVENOUS
  Filled 2020-01-14: qty 2

## 2020-01-14 MED ORDER — THIAMINE HCL 100 MG/ML IJ SOLN
100.0000 mg | Freq: Once | INTRAMUSCULAR | Status: DC
Start: 2020-01-14 — End: 2020-01-14

## 2020-01-14 MED ORDER — ADULT MULTIVITAMIN W/MINERALS CH
1.0000 | ORAL_TABLET | Freq: Every day | ORAL | Status: DC
  Filled 2020-01-14 (×2): qty 1

## 2020-01-14 MED ORDER — FOLIC ACID 1 MG PO TABS: 1.0000 mg | ORAL_TABLET | Freq: Every day | ORAL | Status: DC

## 2020-01-14 MED ORDER — ACETAMINOPHEN 325 MG PO TABS: 650.0000 mg | ORAL_TABLET | Freq: Four times a day (QID) | ORAL | Status: DC | PRN

## 2020-01-14 MED ORDER — SODIUM CHLORIDE 0.9 % IV BOLUS
1000.0000 mL | Freq: Once | INTRAVENOUS | Status: AC
  Administered 2020-01-14: 1000 mL via INTRAVENOUS

## 2020-01-14 MED ORDER — LOPERAMIDE HCL 2 MG PO CAPS: 2.0000 mg | ORAL_CAPSULE | ORAL | Status: DC | PRN

## 2020-01-14 MED ORDER — THIAMINE HCL 100 MG PO TABS
100.0000 mg | ORAL_TABLET | Freq: Every day | ORAL | Status: DC
  Administered 2020-01-14 – 2020-01-16 (×3): 100 mg via ORAL
  Filled 2020-01-14 (×5): qty 1

## 2020-01-14 MED ORDER — LORAZEPAM 1 MG PO TABS: 1.0000 mg | ORAL_TABLET | Freq: Four times a day (QID) | ORAL | Status: DC | PRN

## 2020-01-14 MED ORDER — MAGNESIUM HYDROXIDE 400 MG/5ML PO SUSP: 30.0000 mL | Freq: Every day | ORAL | Status: DC | PRN

## 2020-01-14 MED ORDER — ONDANSETRON 4 MG PO TBDP: 4.0000 mg | ORAL_TABLET | Freq: Four times a day (QID) | ORAL | Status: DC | PRN

## 2020-01-14 MED ORDER — PROMETHAZINE HCL 25 MG/ML IJ SOLN
12.5000 mg | Freq: Once | INTRAMUSCULAR | Status: AC
  Administered 2020-01-14: 12.5 mg via INTRAVENOUS
  Filled 2020-01-14: qty 1

## 2020-01-14 MED ORDER — LORAZEPAM 0.5 MG PO TABS: 0.5000 mg | ORAL_TABLET | Freq: Four times a day (QID) | ORAL | Status: DC | PRN

## 2020-01-14 MED ORDER — ALUM & MAG HYDROXIDE-SIMETH 200-200-20 MG/5ML PO SUSP: 30.0000 mL | ORAL | Status: DC | PRN

## 2020-01-14 NOTE — ED Notes (Addendum)
Spoke to poison control and gave updates. Patients case is closed by poison control.

## 2020-01-14 NOTE — Progress Notes (Signed)
   01/14/20 2042  COVID-19 Daily Checkoff  Have you had a fever (temp > 37.80C/100F)  in the past 24 hours?  No  If you have had runny nose, nasal congestion, sneezing in the past 24 hours, has it worsened? No  COVID-19 EXPOSURE  Have you traveled outside the state in the past 14 days? No  Have you been in contact with someone with a confirmed diagnosis of COVID-19 or PUI in the past 14 days without wearing appropriate PPE? No  Have you been living in the same home as a person with confirmed diagnosis of COVID-19 or a PUI (household contact)? No  Have you been diagnosed with COVID-19? No

## 2020-01-14 NOTE — ED Notes (Signed)
Attempted to give report to [facility] @ [phone]. No answer. This RN will try calling back.

## 2020-01-14 NOTE — ED Notes (Addendum)
Spoke to [facility] and told to go ahead and send patient after IVC is done.

## 2020-01-14 NOTE — Progress Notes (Signed)
   01/14/20 2047  Psych Admission Type (Psych Patients Only)  Admission Status Voluntary  Psychosocial Assessment  Patient Complaints None  Eye Contact Brief  Facial Expression Flat  Affect Appropriate to circumstance  Speech Logical/coherent  Interaction Assertive  Motor Activity Slow (utilizes walker)  Appearance/Hygiene Unremarkable  Behavior Characteristics Appropriate to situation  Mood Depressed  Thought Process  Coherency WDL  Content WDL  Delusions None reported or observed  Perception WDL  Hallucination None reported or observed  Judgment Poor  Confusion None  Danger to Self  Current suicidal ideation? Denies  Danger to Others  Danger to Others None reported or observed

## 2020-01-14 NOTE — ED Notes (Signed)
Patient transported to [facility] via [place] escort and tech escort to help get patient in and out of car.
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
## 2020-01-14 NOTE — [facility] Group Notes (Signed)
Pt did not attend wrap up group this evening. Pt was in bed sleeping.  

## 2020-01-14 NOTE — ED Notes (Addendum)
Patient reports she uses wheelchair from post hip surgery and requires 1 assist with ambulation and ADLs. Patient has physcial therapy 3x a week. [facility] nurse RN made aware and reports she needs to let her [facility] know before patient comes over.   Patient pending IVC.

## 2020-01-14 NOTE — ED Notes (Signed)
Patient ambulated to restroom with walker. Per tech patient require minium assist with getting out of bed and walking with walker. Patient only needed help sliding her legs over.

## 2020-01-14 NOTE — Progress Notes (Signed)
Pt accepted to Room 300-02 to service of MD Dwyane Dee. Report may be called to [phone]. Pt may come asap.
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
## 2020-01-14 NOTE — ED Triage Notes (Signed)
Patient coming from home with [facility]. Patient drunk a bottle of wine and then took a handful of tylenol. [employer] said that she maybe took 30 of tylenol at midnight. Patient had nausea. Patient has anxiety.
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
## 2020-01-14 NOTE — Tx Team (Signed)
Initial Treatment Plan 01/14/2020 7:18 PM Alisha Reynolds [id number]    PATIENT STRESSORS: Traumatic event Other: miscarriage   PATIENT STRENGTHS: Average or above average intelligence Capable of independent living Communication skills Supportive family/friends Work skills   PATIENT IDENTIFIED PROBLEMS:    overwhelmed with situation     (recent hip surgery, miscarriage)               DISCHARGE CRITERIA:  Improved stabilization in mood, thinking, and/or behavior Reduction of life-threatening or endangering symptoms to within safe limits Safe-care adequate arrangements made  PRELIMINARY DISCHARGE PLAN: Outpatient therapy Return to previous living arrangement Return to previous work or school arrangements  PATIENT/FAMILY INVOLVEMENT: This treatment plan has been presented to and reviewed with the patient, Alisha Reynolds, The patient has been given the opportunity to ask questions and make suggestions.  Waymond Cera, RN 01/14/2020, 7:18 PM

## 2020-01-14 NOTE — [facility] Suicide Risk Assessment (Signed)
[facility] Admission Suicide Risk Assessment   Nursing information obtained from:   Patient in chart Demographic factors:   24 year old single female, employed, in college, lives with boyfriend Current Mental Status:   See below Loss Factors:   Elective hip surgery last month, relationship stressors Historical Factors:   1 prior psychiatric admission at age 48 following overdose Risk Reduction Factors:   Resilience, physical health, supportive family  Total Time spent with patient: 45 minutes Principal Problem: S/P acetaminophen overdose.  Adjustment disorder versus MDD Diagnosis:  Active Problems:   Major depressive disorder, recurrent severe without psychotic features (Alisha Reynolds)  Subjective Data:   Continued Clinical Symptoms:    The "Alcohol Use Disorders Identification Test", Guidelines for Use in Primary Care, Second Edition.  World Pharmacologist [facility]). Score between 0-7:  no or low risk or alcohol related problems. Score between 8-15:  moderate risk of alcohol related problems. Score between 16-19:  high risk of alcohol related problems. Score 20 or above:  warrants further diagnostic evaluation for alcohol dependence and treatment.   CLINICAL FACTORS:  24 year old single female, in college, employed.  Presented to ED following acetaminophen overdose.  Initial report indicated she had taken up to 30 tablets but she did report she had taken 5.  Acetaminophen serum levels and ED remained< 10.  She reports she has been consuming alcohol prior to event and her admission BAL was 80.  She denies pattern of alcohol abuse or frequent drinking and states she has not consumed alcohol in several weeks prior to this incident.  She describes overdose as impulsive, unplanned and in the context of argument with her boyfriend. She denies having had suicidal ideations leading up to event and does not endorse either significant depression or neurovegetative symptoms of depression.  She does states she has  been under increased stress recently related to several factors: She had an elective hip surgery for ligament repair on 5/19.  Since then she has been taking time off work, has been at home with less opportunity to go out as she is currently not driving, and states that due to convalescence from hip surgery she has become more dependent on her family for everyday activities.  She also reports she had a miscarriage last name and that she had recently found out her boyfriend was being unfaithful. Currently does not endorse significant neurovegetative symptoms of depression and presents with a reactive affect. History of a prior psychiatric admission for acetaminophen overdose at age 46   Psychiatric Specialty Exam: Physical Exam  Review of Systems  Blood pressure 123/63, pulse 96, temperature 98.4 F (36.9 C), temperature source Oral, resp. rate 16, SpO2 100 %.There is no height or weight on file to calculate BMI.  See admit note MSE  COGNITIVE FEATURES THAT CONTRIBUTE TO RISK:  Closed-mindedness and Loss of executive function    SUICIDE RISK:   Moderate:  Frequent suicidal ideation with limited intensity, and duration, some specificity in terms of plans, no associated intent, good self-control, limited dysphoria/symptomatology, some risk factors present, and identifiable protective factors, including available and accessible social support.  PLAN OF CARE: Patient will be admitted to inpatient psychiatric unit for stabilization and safety. Will provide and encourage milieu participation. Provide medication management and maked adjustments as needed.  Will follow daily.    I certify that inpatient services furnished can reasonably be expected to improve the patient's condition.   Jenne Campus, MD 01/14/2020, 6:10 PM
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
## 2020-01-14 NOTE — ED Notes (Signed)
Patient was asked what was her intentions when she took 30 of tylenol. Patient states that she had just got into with her boyfriend. Patient was asked if I took 30 of tylenol what would I be doing to myself, patient states that I would be killing myself. Patient was then asked to change out into scrubs. Patient states no. Patient was told that the md would be notified. MD notified. Patient in the process of getting IVC'd. Patient states that she did not consent to that. Patient starts to cuss and fuss about she was not going to give her phone or change out. Patient requested Charge Nurse. Charge Nurse notified. Patient requested another nurse. Patient is being transferred to another nurse.
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
## 2020-01-14 NOTE — ED Notes (Addendum)
Patient denies SI or HI at this time and would like to go home with resources. Patient does not want to go to [facility]. TTS made aware.
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
## 2020-01-14 NOTE — ED Notes (Signed)
Patient calm and cooperative. Patient given meal tray.

## 2020-01-15 MED ORDER — IBUPROFEN 600 MG PO TABS
600.0000 mg | ORAL_TABLET | Freq: Once | ORAL | Status: AC
  Administered 2020-01-15: 600 mg via ORAL
  Filled 2020-01-15 (×2): qty 1
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
## 2020-01-15 NOTE — Tx Team (Cosign Needed)
Interdisciplinary Treatment and Diagnostic Plan Update  01/15/2020 Time of Session: 9:00am Alisha Reynolds MRN: [id number]  Principal Diagnosis: <principal problem not specified>  Secondary Diagnoses: Active Problems:   Major depressive disorder, recurrent severe without psychotic features ([place])   Current Medications:  Current Facility-Administered Medications  Medication Dose Route Frequency Provider Last Rate Last Admin  . folic acid (FOLVITE) tablet 1 mg  1 mg Oral Daily Cobos, Fernando A, MD   1 mg at 01/15/20 7341  . hydrOXYzine (ATARAX/VISTARIL) tablet 25 mg  25 mg Oral Q6H PRN Cobos, [name], MD      . LORazepam (ATIVAN) tablet 1 mg  1 mg Oral Q6H PRN Cobos, [name], MD      . thiamine tablet 100 mg  100 mg Oral Daily Cobos, [name], MD   100 mg at 01/15/20 0829  . traZODone (DESYREL) tablet 50 mg  50 mg Oral QHS PRN Cobos, [name], MD       PTA Medications: Medications Prior to Admission  Medication Sig Dispense Refill Last Dose  . EPINEPHrine 0.3 mg/0.3 mL IJ SOAJ injection Inject 0.3 mLs (0.3 mg total) into the muscle once as needed for anaphylaxis. 2 each 1   . Multiple Vitamin (MULTIVITAMIN) tablet Take 1 tablet by mouth daily.     . Olopatadine HCl 0.2 % SOLN Apply 1 drop to eye daily. (Patient not taking: Reported on 05/05/2019) 2.5 mL 5   . valACYclovir (VALTREX) 1000 MG tablet Take 2,000 mg by mouth 2 (two) times daily as needed (flareup).        Patient Stressors: Traumatic event Other: miscarriage  Patient Strengths: Average or above average intelligence Capable of independent living Communication skills Supportive family/friends Work skills  Treatment Modalities: Medication Management, Group therapy, Case management,  1 to 1 session with clinician, Psychoeducation, Recreational therapy.   Physician Treatment Plan for Primary Diagnosis: <principal problem not specified> Long Term Goal(s): Improvement in symptoms so as ready for  discharge Improvement in symptoms so as ready for discharge   Short Term Goals: Ability to identify changes in lifestyle to reduce recurrence of condition will improve Ability to verbalize feelings will improve Ability to disclose and discuss suicidal ideas Ability to demonstrate self-control will improve Ability to identify and develop effective coping behaviors will improve Ability to maintain clinical measurements within normal limits will improve Compliance with prescribed medications will improve Ability to identify changes in lifestyle to reduce recurrence of condition will improve Ability to verbalize feelings will improve Ability to disclose and discuss suicidal ideas Ability to demonstrate self-control will improve Ability to identify and develop effective coping behaviors will improve Ability to maintain clinical measurements within normal limits will improve Compliance with prescribed medications will improve  Medication Management: Evaluate patient's response, side effects, and tolerance of medication regimen.  Therapeutic Interventions: 1 to 1 sessions, Unit Group sessions and Medication administration.  Evaluation of Outcomes: Progressing  Physician Treatment Plan for Secondary Diagnosis: Active Problems:   Major depressive disorder, recurrent severe without psychotic features ([place])  Long Term Goal(s): Improvement in symptoms so as ready for discharge Improvement in symptoms so as ready for discharge   Short Term Goals: Ability to identify changes in lifestyle to reduce recurrence of condition will improve Ability to verbalize feelings will improve Ability to disclose and discuss suicidal ideas Ability to demonstrate self-control will improve Ability to identify and develop effective coping behaviors will improve Ability to maintain clinical measurements within normal limits will improve Compliance with prescribed  medications will improve Ability to identify changes  in lifestyle to reduce recurrence of condition will improve Ability to verbalize feelings will improve Ability to disclose and discuss suicidal ideas Ability to demonstrate self-control will improve Ability to identify and develop effective coping behaviors will improve Ability to maintain clinical measurements within normal limits will improve Compliance with prescribed medications will improve     Medication Management: Evaluate patient's response, side effects, and tolerance of medication regimen.  Therapeutic Interventions: 1 to 1 sessions, Unit Group sessions and Medication administration.  Evaluation of Outcomes: Progressing   RN Treatment Plan for Primary Diagnosis: <principal problem not specified> Long Term Goal(s): Knowledge of disease and therapeutic regimen to maintain health will improve  Short Term Goals: Ability to verbalize feelings will improve, Ability to disclose and discuss suicidal ideas and Ability to identify and develop effective coping behaviors will improve  Medication Management: RN will administer medications as ordered by provider, will assess and evaluate patient's response and provide education to patient for prescribed medication. RN will report any adverse and/or side effects to prescribing provider.  Therapeutic Interventions: 1 on 1 counseling sessions, Psychoeducation, Medication administration, Evaluate responses to treatment, Monitor vital signs and CBGs as ordered, Perform/monitor CIWA, COWS, AIMS and Fall Risk screenings as ordered, Perform wound care treatments as ordered.  Evaluation of Outcomes: Progressing   LCSW Treatment Plan for Primary Diagnosis: <principal problem not specified> Long Term Goal(s): Safe transition to appropriate next level of care at discharge, Engage patient in therapeutic group addressing interpersonal concerns.  Short Term Goals: Engage patient in aftercare planning with referrals and resources, Increase social support,  Increase emotional regulation and Increase skills for wellness and recovery  Therapeutic Interventions: Assess for all discharge needs, 1 to 1 time with Social worker, Explore available resources and support systems, Assess for adequacy in community support network, Educate family and significant other(s) on suicide prevention, Complete Psychosocial Assessment, Interpersonal group therapy.  Evaluation of Outcomes: Progressing   Progress in Treatment: Attending groups: No. Participating in groups: No. Taking medication as prescribed: Yes. Toleration medication: Yes. Family/Significant other contact made: No, will contact:  supports if consents are granted Patient understands diagnosis: Yes. Discussing patient identified problems/goals with staff: Yes. Medical problems stabilized or resolved: Yes. Denies suicidal/homicidal ideation: Yes. Issues/concerns per patient self-inventory: Yes.  New problem(s) identified: Yes, Describe:  grief and loss  New Short Term/Long Term Goal(s): medication management for mood stabilization; elimination of SI thoughts; development of comprehensive mental wellness/sobriety plan.  Patient Goals:  Marena Chancy of goals, would like to return home to rest. Plans to start seeing a therapist outpatient.  Discharge Plan or Barriers: Patient recently admitted to unit, CSW assessing for appropriate referrals.   Reason for Continuation of Hospitalization: Anxiety Depression Medication stabilization  Estimated Length of Stay: 3-5 days  Attendees: Patient: Alisha Reynolds 01/15/2020 9:00 AM  Physician: Dr. Parke Poisson 01/15/2020 9:00 AM  Nursing:  01/15/2020 9:00 AM  RN Care Manager: 01/15/2020 9:00 AM  Social Worker: Stephanie Acre, [place] 01/15/2020 9:00 AM  Recreational Therapist:  01/15/2020 9:00 AM  Other:  01/15/2020 9:00 AM  Other:  01/15/2020 9:00 AM  Other: 01/15/2020 9:00 AM    Scribe for Treatment Team: Joellen Jersey, [place] 01/15/2020 9:00 AM
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
## 2020-01-16 NOTE — Progress Notes (Signed)
  [facility] Adult Case Management Discharge Plan :  Will you be returning to the same living situation after discharge:  Yes,  patient is returning home with her boyfriend At discharge, do you have transportation home?: Yes,  patient's mother is picking her up Do you have the ability to pay for your medications: Yes,  BCBS  Release of information consent forms completed and in the chart;  Patient's signature needed at discharge.  Patient to Follow up at:  Follow-up Information    [facility] Go to.   Why: A referral has been made on your behalf. Please contact office and ask to establish services for therapy. Be sure to have your discharge paperwork. Contact information: [place]  [id number] [phone]               Next level of care provider has access to [place] and Suicide Prevention discussed: Yes,  with the patient's mother     Has patient been referred to the Quitline?: N/A patient is not a smoker  Patient has been referred for addiction treatment: N/A  Marylee Floras, [place] 01/16/2020, 10:22 AM
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
## 2020-01-16 NOTE — Progress Notes (Signed)
D: Pt A & O X 4. Denies SI, HI, AVH and pain at this time. D/C home as ordered. Picked up in lobby by her mother. A: D/C instructions reviewed with pt including  follow up appointments; compliance encouraged. All belongings from locker 17 given to pt at time of departure. Scheduled medications given with verbal education and effects monitored. Safety checks maintained without incident till time of d/c.  R: Pt receptive to care. Compliant with medications when offered. Denies adverse drug reactions when assessed. Verbalized understanding related to d/c instructions. Signed belonging sheet in agreement with items received from locker. Ambulatory with a steady gait. Appears to be in no physical distress at time of departure.

## 2020-01-16 NOTE — Progress Notes (Signed)
   01/15/20 2215  Psych Admission Type (Psych Patients Only)  Admission Status Involuntary  Psychosocial Assessment  Patient Complaints None  Eye Contact Brief  Facial Expression Flat  Affect Depressed  Speech Logical/coherent  Interaction Minimal  Motor Activity Slow (uses walker or wheelchair d/t recent hip surgery)  Appearance/Hygiene Unremarkable  Behavior Characteristics Cooperative  Mood Pleasant;Depressed  Thought Process  Coherency WDL  Content WDL  Delusions None reported or observed  Perception WDL  Hallucination None reported or observed  Judgment Poor  Confusion None  Danger to Self  Current suicidal ideation? Denies  Danger to Others  Danger to Others None reported or observed
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
## 2020-01-16 NOTE — BHH Suicide Risk Assessment (Signed)
[facility] Discharge Suicide Risk Assessment   Principal Problem: <principal problem not specified> Discharge Diagnoses: Active Problems:   Major depressive disorder, recurrent severe without psychotic features ([place])   Total Time spent with patient: 20 minutes  Musculoskeletal: Strength & Muscle Tone: decreased Gait & Station: shuffle Patient leans: N/A  Psychiatric Specialty Exam: Review of Systems  Musculoskeletal: Positive for arthralgias, gait problem, joint swelling and myalgias.  All other systems reviewed and are negative.   Blood pressure (!) 117/57, pulse (!) 122, temperature 97.9 F (36.6 C), temperature source Oral, resp. rate 16, height 5\' 1"  (1.549 m), weight 63.5 kg, SpO2 100 %.Body mass index is 26.45 kg/m.  General Appearance: Casual  Eye Contact::  Fair  Speech:  Normal Rate409  Volume:  Normal  Mood:  Anxious  Affect:  Congruent  Thought Process:  Coherent and Descriptions of Associations: Intact  Orientation:  Full (Time, Place, and Person)  Thought Content:  Logical  Suicidal Thoughts:  No  Homicidal Thoughts:  No  Memory:  Immediate;   Fair Recent;   Fair Remote;   Fair  Judgement:  Intact  Insight:  Fair  Psychomotor Activity:  Normal  Concentration:  Good  Recall:  Good  Fund of Knowledge:Good  Language: Good  Akathisia:  Negative  Handed:  Right  AIMS (if indicated):     Assets:  Desire for Improvement Housing Resilience Social Support  Sleep:  Number of Hours: 6.75  Cognition: WNL  ADL's:  Intact   Mental Status Per Nursing Assessment::   On Admission:  Suicidal ideation indicated by others  Demographic Factors:  Low socioeconomic status  Loss Factors: Decline in physical health  Historical Factors: Impulsivity  Risk Reduction Factors:   Positive social support  Continued Clinical Symptoms:  Depression:   Comorbid alcohol abuse/dependence Impulsivity Alcohol/Substance Abuse/Dependencies  Cognitive Features That Contribute To  Risk:  None    Suicide Risk:  Minimal: No identifiable suicidal ideation.  Patients presenting with no risk factors but with morbid ruminations; may be classified as minimal risk based on the severity of the depressive symptoms   Follow-up [facility] Follow up.   Why: You have an appointment for  Contact information: [place] [address] [phone]               Plan Of Care/Follow-up recommendations:  Activity:  ad lib  Sharma Covert, MD 01/16/2020, 8:59 AM

## 2020-01-16 NOTE — [facility] Suicide Risk Assessment (Signed)
Post INPATIENT:  Family/Significant Other Suicide Prevention Education  Suicide Prevention Education:  Education Completed; mother, Carma Lair [phone]) has been identified by the patient as the family member/significant other with whom the patient will be residing, and identified as the person(s) who will aid the patient in the event of a mental health crisis (suicidal ideations/suicide attempt).  With written consent from the patient, the family member/significant other has been provided the following suicide prevention education, prior to the and/or following the discharge of the patient.  The suicide prevention education provided includes the following:  Suicide risk factors  Suicide prevention and interventions  National Suicide Hotline telephone number  [facility] assessment telephone number  [facility] Emergency Assistance [place] and/or Residential Mobile Crisis Unit telephone number  Request made of family/significant other to:  Remove weapons (e.g., guns, rifles, knives), all items previously/currently identified as safety concern.    Remove drugs/medications (over-the-counter, prescriptions, illicit drugs), all items previously/currently identified as a safety concern.  The family member/significant other verbalizes understanding of the suicide prevention education information provided.  The family member/significant other agrees to remove the items of safety concern listed above.   Mother is agreeable to patient discharging this morning to make her physical therapy appointment. Mother is hopeful that patient will engage in outpatient therapy and focus on her own recovery and well being.   Joellen Jersey 01/16/2020, 9:27 AM

## 2020-03-05 ENCOUNTER — Encounter (HOSPITAL_COMMUNITY): Payer: Self-pay | Admitting: Emergency Medicine

## 2020-03-05 ENCOUNTER — Emergency Department (HOSPITAL_COMMUNITY): Payer: BC Managed Care – PPO

## 2020-03-05 ENCOUNTER — Other Ambulatory Visit: Payer: Self-pay

## 2020-03-05 ENCOUNTER — Emergency Department (HOSPITAL_COMMUNITY)
Admission: EM | Admit: 2020-03-05 | Discharge: 2020-03-05 | Disposition: A | Discharge status: Home / Self Care | Payer: BC Managed Care – PPO | Source: Home / Self Care | Attending: Emergency Medicine | Admitting: Emergency Medicine

## 2020-03-05 DIAGNOSIS — S20219A Contusion of unspecified front wall of thorax, initial encounter: Secondary | ICD-10-CM

## 2020-03-05 DIAGNOSIS — S40021A Contusion of right upper arm, initial encounter: Secondary | ICD-10-CM | POA: Insufficient documentation

## 2020-03-05 DIAGNOSIS — S36893A Laceration of other intra-abdominal organs, initial encounter: Secondary | ICD-10-CM | POA: Diagnosis not present

## 2020-03-05 DIAGNOSIS — Z20822 Contact with and (suspected) exposure to covid-19: Secondary | ICD-10-CM | POA: Insufficient documentation

## 2020-03-05 DIAGNOSIS — Z91010 Allergy to peanuts: Secondary | ICD-10-CM | POA: Insufficient documentation

## 2020-03-05 DIAGNOSIS — S70212A Abrasion, left hip, initial encounter: Secondary | ICD-10-CM | POA: Insufficient documentation

## 2020-03-05 DIAGNOSIS — S30811A Abrasion of abdominal wall, initial encounter: Secondary | ICD-10-CM | POA: Insufficient documentation

## 2020-03-05 DIAGNOSIS — Y929 Unspecified place or not applicable: Secondary | ICD-10-CM | POA: Insufficient documentation

## 2020-03-05 DIAGNOSIS — F419 Anxiety disorder, unspecified: Secondary | ICD-10-CM | POA: Insufficient documentation

## 2020-03-05 DIAGNOSIS — T1490XA Injury, unspecified, initial encounter: Secondary | ICD-10-CM

## 2020-03-05 DIAGNOSIS — Y939 Activity, unspecified: Secondary | ICD-10-CM | POA: Insufficient documentation

## 2020-03-05 DIAGNOSIS — K661 Hemoperitoneum: Secondary | ICD-10-CM | POA: Diagnosis not present

## 2020-03-05 DIAGNOSIS — S70211A Abrasion, right hip, initial encounter: Secondary | ICD-10-CM | POA: Insufficient documentation

## 2020-03-05 DIAGNOSIS — T07XXXA Unspecified multiple injuries, initial encounter: Secondary | ICD-10-CM

## 2020-03-05 DIAGNOSIS — Y999 Unspecified external cause status: Secondary | ICD-10-CM | POA: Insufficient documentation

## 2020-03-05 LAB — CBC
HCT: 36.4 % (ref 36.0–46.0)
Hemoglobin: 11.4 g/dL — ABNORMAL LOW (ref 12.0–15.0)
MCH: 26.8 pg (ref 26.0–34.0)
MCHC: 31.3 g/dL (ref 30.0–36.0)
MCV: 85.4 fL (ref 80.0–100.0)
Platelets: 332 10*3/uL (ref 150–400)
RBC: 4.26 MIL/uL (ref 3.87–5.11)
RDW: 14.2 % (ref 11.5–15.5)
WBC: 6.7 10*3/uL (ref 4.0–10.5)
nRBC: 0 % (ref 0.0–0.2)

## 2020-03-05 LAB — I-STAT CHEM 8, ED
BUN: 16 mg/dL (ref 6–20)
Calcium, Ion: 1.18 mmol/L (ref 1.15–1.40)
Chloride: 102 mmol/L (ref 98–111)
Creatinine, Ser: 0.8 mg/dL (ref 0.44–1.00)
Glucose, Bld: 132 mg/dL — ABNORMAL HIGH (ref 70–99)
HCT: 38 % (ref 36.0–46.0)
Hemoglobin: 12.9 g/dL (ref 12.0–15.0)
Potassium: 4 mmol/L (ref 3.5–5.1)
Sodium: 139 mmol/L (ref 135–145)
TCO2: 27 mmol/L (ref 22–32)

## 2020-03-05 LAB — URINALYSIS, ROUTINE W REFLEX MICROSCOPIC
Bilirubin Urine: NEGATIVE
Glucose, UA: NEGATIVE mg/dL
Hgb urine dipstick: NEGATIVE
Ketones, ur: NEGATIVE mg/dL
Leukocytes,Ua: NEGATIVE
Nitrite: NEGATIVE
Protein, ur: NEGATIVE mg/dL
Specific Gravity, Urine: >1.046 — ABNORMAL HIGH (ref 1.005–1.030)
pH: 7 (ref 5.0–8.0)

## 2020-03-05 LAB — COMPREHENSIVE METABOLIC PANEL
ALT: 17 U/L (ref 0–44)
AST: 27 U/L (ref 15–41)
Albumin: 3.8 g/dL (ref 3.5–5.0)
Alkaline Phosphatase: 106 U/L (ref 38–126)
Anion gap: 14 (ref 5–15)
BUN: 12 mg/dL (ref 6–20)
CO2: 22 mmol/L (ref 22–32)
Calcium: 9.7 mg/dL (ref 8.9–10.3)
Chloride: 102 mmol/L (ref 98–111)
Creatinine, Ser: 0.83 mg/dL (ref 0.44–1.00)
GFR calc Af Amer: >60 mL/min (ref >60)
GFR calc non Af Amer: >60 mL/min (ref >60)
Glucose, Bld: 133 mg/dL — ABNORMAL HIGH (ref 70–99)
Potassium: 3.8 mmol/L (ref 3.5–5.1)
Sodium: 138 mmol/L (ref 135–145)
Total Bilirubin: 0.7 mg/dL (ref 0.3–1.2)
Total Protein: 6.4 g/dL — ABNORMAL LOW (ref 6.5–8.1)

## 2020-03-05 LAB — SARS CORONAVIRUS 2 BY RT PCR (HOSPITAL ORDER, PERFORMED IN ~~LOC~~ HOSPITAL LAB): SARS Coronavirus 2: NEGATIVE

## 2020-03-05 LAB — PROTIME-INR
INR: 1 (ref 0.8–1.2)
Prothrombin Time: 13 seconds (ref 11.4–15.2)

## 2020-03-05 LAB — ETHANOL: Alcohol, Ethyl (B): <10 mg/dL (ref <10)

## 2020-03-05 LAB — SAMPLE TO BLOOD BANK

## 2020-03-05 LAB — LACTIC ACID, PLASMA: Lactic Acid, Venous: 3.2 mmol/L (ref 0.5–1.9)

## 2020-03-05 MED ORDER — LORAZEPAM 2 MG/ML IJ SOLN
0.5000 mg | Freq: Once | INTRAMUSCULAR | Status: AC
  Administered 2020-03-05: 0.5 mg via INTRAVENOUS
  Filled 2020-03-05: qty 1

## 2020-03-05 MED ORDER — FENTANYL CITRATE (PF) 100 MCG/2ML IJ SOLN
INTRAMUSCULAR | Status: AC
  Filled 2020-03-05: qty 2

## 2020-03-05 MED ORDER — IOHEXOL 300 MG/ML  SOLN
100.0000 mL | Freq: Once | INTRAMUSCULAR | Status: AC | PRN
  Administered 2020-03-05: 100 mL via INTRAVENOUS

## 2020-03-05 MED ORDER — HYDROMORPHONE HCL 1 MG/ML IJ SOLN
0.5000 mg | Freq: Once | INTRAMUSCULAR | Status: AC
  Administered 2020-03-05: 0.5 mg via INTRAVENOUS
  Filled 2020-03-05: qty 1

## 2020-03-05 MED ORDER — SODIUM CHLORIDE 0.9 % IV BOLUS
1000.0000 mL | Freq: Once | INTRAVENOUS | Status: AC
  Administered 2020-03-05: 1000 mL via INTRAVENOUS

## 2020-03-05 MED ORDER — FENTANYL CITRATE (PF) 100 MCG/2ML IJ SOLN: 100.0000 ug | Freq: Once | INTRAMUSCULAR | Status: DC

## 2020-03-05 MED ORDER — KETOROLAC TROMETHAMINE 30 MG/ML IJ SOLN
30.0000 mg | Freq: Once | INTRAMUSCULAR | Status: AC
  Administered 2020-03-05: 30 mg via INTRAVENOUS
  Filled 2020-03-05: qty 1

## 2020-03-05 MED ORDER — LORAZEPAM 2 MG/ML IJ SOLN
1.0000 mg | Freq: Once | INTRAMUSCULAR | Status: AC
  Administered 2020-03-05: 1 mg via INTRAVENOUS
  Filled 2020-03-05: qty 1

## 2020-03-05 MED ORDER — FENTANYL CITRATE (PF) 100 MCG/2ML IJ SOLN
INTRAMUSCULAR | Status: AC | PRN
  Administered 2020-03-05 (×2): 50 ug via INTRAVENOUS
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
## 2020-03-05 NOTE — Discharge Instructions (Signed)
Return to the emergency department if you have any problems breathing, severe abdominal pain, vomiting, or focal numbness/weakness.

## 2020-03-05 NOTE — ED Notes (Signed)
Patient transported to CT 
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
## 2020-03-06 ENCOUNTER — Inpatient Hospital Stay (HOSPITAL_COMMUNITY)
Admission: EM | Admit: 2020-03-06 | Discharge: 2020-03-12 | DRG: 908 | Disposition: A | Discharge status: Home / Self Care | Payer: BC Managed Care – PPO | Attending: Physician Assistant | Admitting: Physician Assistant

## 2020-03-06 ENCOUNTER — Encounter (HOSPITAL_COMMUNITY): Payer: Self-pay | Admitting: Emergency Medicine

## 2020-03-06 ENCOUNTER — Encounter (HOSPITAL_COMMUNITY): Payer: Self-pay | Admitting: Psychiatry

## 2020-03-06 ENCOUNTER — Emergency Department (HOSPITAL_COMMUNITY): Payer: BC Managed Care – PPO

## 2020-03-06 DIAGNOSIS — Z79899 Other long term (current) drug therapy: Secondary | ICD-10-CM

## 2020-03-06 DIAGNOSIS — M25551 Pain in right hip: Secondary | ICD-10-CM | POA: Diagnosis not present

## 2020-03-06 DIAGNOSIS — J45909 Unspecified asthma, uncomplicated: Secondary | ICD-10-CM | POA: Diagnosis present

## 2020-03-06 DIAGNOSIS — S36893A Laceration of other intra-abdominal organs, initial encounter: Principal | ICD-10-CM | POA: Diagnosis present

## 2020-03-06 DIAGNOSIS — Y92410 Unspecified street and highway as the place of occurrence of the external cause: Secondary | ICD-10-CM

## 2020-03-06 DIAGNOSIS — K661 Hemoperitoneum: Secondary | ICD-10-CM | POA: Diagnosis present

## 2020-03-06 DIAGNOSIS — Z91010 Allergy to peanuts: Secondary | ICD-10-CM

## 2020-03-06 DIAGNOSIS — K567 Ileus, unspecified: Secondary | ICD-10-CM | POA: Diagnosis not present

## 2020-03-06 DIAGNOSIS — I959 Hypotension, unspecified: Secondary | ICD-10-CM | POA: Diagnosis present

## 2020-03-06 DIAGNOSIS — R109 Unspecified abdominal pain: Secondary | ICD-10-CM | POA: Diagnosis present

## 2020-03-06 DIAGNOSIS — Z20822 Contact with and (suspected) exposure to covid-19: Secondary | ICD-10-CM | POA: Diagnosis not present

## 2020-03-06 LAB — COMPREHENSIVE METABOLIC PANEL
ALT: 24 U/L (ref 0–44)
AST: 42 U/L — ABNORMAL HIGH (ref 15–41)
Albumin: 4.1 g/dL (ref 3.5–5.0)
Alkaline Phosphatase: 96 U/L (ref 38–126)
Anion gap: 10 (ref 5–15)
BUN: 8 mg/dL (ref 6–20)
CO2: 25 mmol/L (ref 22–32)
Calcium: 9.1 mg/dL (ref 8.9–10.3)
Chloride: 105 mmol/L (ref 98–111)
Creatinine, Ser: 0.68 mg/dL (ref 0.44–1.00)
GFR calc Af Amer: >60 mL/min (ref >60)
GFR calc non Af Amer: >60 mL/min (ref >60)
Glucose, Bld: 77 mg/dL (ref 70–99)
Potassium: 3.6 mmol/L (ref 3.5–5.1)
Sodium: 140 mmol/L (ref 135–145)
Total Bilirubin: 0.6 mg/dL (ref 0.3–1.2)
Total Protein: 6.9 g/dL (ref 6.5–8.1)

## 2020-03-06 LAB — CBC
HCT: 31.6 % — ABNORMAL LOW (ref 36.0–46.0)
Hemoglobin: 10.1 g/dL — ABNORMAL LOW (ref 12.0–15.0)
MCH: 28 pg (ref 26.0–34.0)
MCHC: 32 g/dL (ref 30.0–36.0)
MCV: 87.5 fL (ref 80.0–100.0)
Platelets: 243 10*3/uL (ref 150–400)
RBC: 3.61 MIL/uL — ABNORMAL LOW (ref 3.87–5.11)
RDW: 14.5 % (ref 11.5–15.5)
WBC: 6.8 10*3/uL (ref 4.0–10.5)
nRBC: 0 % (ref 0.0–0.2)

## 2020-03-06 LAB — URINALYSIS, ROUTINE W REFLEX MICROSCOPIC
Bilirubin Urine: NEGATIVE
Glucose, UA: NEGATIVE mg/dL
Hgb urine dipstick: NEGATIVE
Ketones, ur: 20 mg/dL — AB
Leukocytes,Ua: NEGATIVE
Nitrite: NEGATIVE
Protein, ur: NEGATIVE mg/dL
Specific Gravity, Urine: 1.011 (ref 1.005–1.030)
pH: 6 (ref 5.0–8.0)

## 2020-03-06 LAB — SARS CORONAVIRUS 2 BY RT PCR (HOSPITAL ORDER, PERFORMED IN ~~LOC~~ HOSPITAL LAB): SARS Coronavirus 2: NEGATIVE

## 2020-03-06 LAB — I-STAT BETA HCG BLOOD, ED (MC, WL, AP ONLY): I-stat hCG, quantitative: <5 m[IU]/mL (ref <5)

## 2020-03-06 LAB — LIPASE, BLOOD: Lipase: 19 U/L (ref 11–51)

## 2020-03-06 MED ORDER — DOCUSATE SODIUM 100 MG PO CAPS
100.0000 mg | ORAL_CAPSULE | Freq: Two times a day (BID) | ORAL | Status: DC
  Administered 2020-03-06 – 2020-03-12 (×11): 100 mg via ORAL
  Filled 2020-03-06 (×11): qty 1

## 2020-03-06 MED ORDER — ACETAMINOPHEN 325 MG PO TABS
650.0000 mg | ORAL_TABLET | Freq: Four times a day (QID) | ORAL | Status: DC
  Administered 2020-03-06 – 2020-03-12 (×19): 650 mg via ORAL
  Filled 2020-03-06 (×19): qty 2

## 2020-03-06 MED ORDER — ONDANSETRON 4 MG PO TBDP: 4.0000 mg | ORAL_TABLET | Freq: Four times a day (QID) | ORAL | Status: DC | PRN

## 2020-03-06 MED ORDER — SODIUM CHLORIDE 0.9 % IV BOLUS
1000.0000 mL | Freq: Once | INTRAVENOUS | Status: AC
  Administered 2020-03-06: 1000 mL via INTRAVENOUS

## 2020-03-06 MED ORDER — PANTOPRAZOLE SODIUM 40 MG IV SOLR
40.0000 mg | Freq: Every day | INTRAVENOUS | Status: DC
  Administered 2020-03-07: 40 mg via INTRAVENOUS
  Filled 2020-03-06 (×3): qty 40

## 2020-03-06 MED ORDER — PANTOPRAZOLE SODIUM 40 MG PO TBEC
40.0000 mg | DELAYED_RELEASE_TABLET | Freq: Every day | ORAL | Status: DC
  Administered 2020-03-06 – 2020-03-12 (×6): 40 mg via ORAL
  Filled 2020-03-06 (×6): qty 1

## 2020-03-06 MED ORDER — HYDROMORPHONE HCL 1 MG/ML IJ SOLN
1.0000 mg | Freq: Once | INTRAMUSCULAR | Status: AC
  Administered 2020-03-06: 1 mg via INTRAVENOUS
  Filled 2020-03-06: qty 1

## 2020-03-06 MED ORDER — METHOCARBAMOL 1000 MG/10ML IJ SOLN
500.0000 mg | Freq: Four times a day (QID) | INTRAVENOUS | Status: DC | PRN
  Filled 2020-03-06: qty 5

## 2020-03-06 MED ORDER — GABAPENTIN 300 MG PO CAPS
300.0000 mg | ORAL_CAPSULE | Freq: Three times a day (TID) | ORAL | Status: DC
  Administered 2020-03-06 – 2020-03-12 (×16): 300 mg via ORAL
  Filled 2020-03-06 (×16): qty 1

## 2020-03-06 MED ORDER — ONDANSETRON HCL 4 MG/2ML IJ SOLN
4.0000 mg | Freq: Four times a day (QID) | INTRAMUSCULAR | Status: DC | PRN
  Administered 2020-03-06 – 2020-03-12 (×4): 4 mg via INTRAVENOUS
  Filled 2020-03-06 (×4): qty 2

## 2020-03-06 MED ORDER — HYDROMORPHONE HCL 1 MG/ML IJ SOLN
0.5000 mg | INTRAMUSCULAR | Status: DC | PRN
  Administered 2020-03-06 – 2020-03-10 (×11): 0.5 mg via INTRAVENOUS
  Filled 2020-03-06 (×11): qty 1

## 2020-03-06 MED ORDER — OXYCODONE HCL 5 MG PO TABS
5.0000 mg | ORAL_TABLET | ORAL | Status: DC | PRN
  Administered 2020-03-06 – 2020-03-11 (×12): 5 mg via ORAL
  Filled 2020-03-06 (×12): qty 1

## 2020-03-06 MED ORDER — SODIUM CHLORIDE 0.9 % IV SOLN: INTRAVENOUS | Status: DC

## 2020-03-06 MED ORDER — IOHEXOL 300 MG/ML  SOLN
100.0000 mL | Freq: Once | INTRAMUSCULAR | Status: AC | PRN
  Administered 2020-03-06: 100 mL via INTRAVENOUS
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
## 2020-03-06 NOTE — ED Notes (Signed)
[facility] called. 
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
## 2020-03-07 ENCOUNTER — Other Ambulatory Visit: Payer: Self-pay

## 2020-03-07 DIAGNOSIS — J45909 Unspecified asthma, uncomplicated: Secondary | ICD-10-CM | POA: Diagnosis present

## 2020-03-07 DIAGNOSIS — Z79899 Other long term (current) drug therapy: Secondary | ICD-10-CM | POA: Diagnosis not present

## 2020-03-07 DIAGNOSIS — K567 Ileus, unspecified: Secondary | ICD-10-CM | POA: Diagnosis not present

## 2020-03-07 DIAGNOSIS — Z91010 Allergy to peanuts: Secondary | ICD-10-CM | POA: Diagnosis not present

## 2020-03-07 DIAGNOSIS — Y92410 Unspecified street and highway as the place of occurrence of the external cause: Secondary | ICD-10-CM | POA: Diagnosis not present

## 2020-03-07 DIAGNOSIS — S36893A Laceration of other intra-abdominal organs, initial encounter: Secondary | ICD-10-CM | POA: Diagnosis present

## 2020-03-07 DIAGNOSIS — M25551 Pain in right hip: Secondary | ICD-10-CM | POA: Diagnosis present

## 2020-03-07 DIAGNOSIS — K661 Hemoperitoneum: Secondary | ICD-10-CM | POA: Diagnosis present

## 2020-03-07 DIAGNOSIS — I959 Hypotension, unspecified: Secondary | ICD-10-CM | POA: Diagnosis present

## 2020-03-07 DIAGNOSIS — Z20822 Contact with and (suspected) exposure to covid-19: Secondary | ICD-10-CM | POA: Diagnosis present

## 2020-03-07 LAB — CBC
HCT: 27.7 % — ABNORMAL LOW (ref 36.0–46.0)
Hemoglobin: 8.4 g/dL — ABNORMAL LOW (ref 12.0–15.0)
MCH: 27 pg (ref 26.0–34.0)
MCHC: 30.3 g/dL (ref 30.0–36.0)
MCV: 89.1 fL (ref 80.0–100.0)
Platelets: 188 10*3/uL (ref 150–400)
RBC: 3.11 MIL/uL — ABNORMAL LOW (ref 3.87–5.11)
RDW: 14.1 % (ref 11.5–15.5)
WBC: 5.6 10*3/uL (ref 4.0–10.5)
nRBC: 0 % (ref 0.0–0.2)

## 2020-03-07 LAB — BASIC METABOLIC PANEL
Anion gap: 11 (ref 5–15)
BUN: 7 mg/dL (ref 6–20)
CO2: 19 mmol/L — ABNORMAL LOW (ref 22–32)
Calcium: 8.7 mg/dL — ABNORMAL LOW (ref 8.9–10.3)
Chloride: 106 mmol/L (ref 98–111)
Creatinine, Ser: 0.81 mg/dL (ref 0.44–1.00)
GFR calc Af Amer: >60 mL/min (ref >60)
GFR calc non Af Amer: >60 mL/min (ref >60)
Glucose, Bld: 55 mg/dL — ABNORMAL LOW (ref 70–99)
Potassium: 3.9 mmol/L (ref 3.5–5.1)
Sodium: 136 mmol/L (ref 135–145)

## 2020-03-07 NOTE — Plan of Care (Signed)

## 2020-03-07 NOTE — Plan of Care (Signed)
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
## 2020-03-07 NOTE — TOC CAGE-AID Note (Signed)
Transition of Care [facility]) - CAGE-AID Screening   Patient Details  Name: Alisha Reynolds MRN: [id number] Date of Birth: 13-Dec-1995  [facility] of [facility]) CM/SW Contact:    Emeterio Reeve, [place] Phone Number: 03/07/2020, 4:27 PM   Clinical Narrative: CSW met with pt at bedside. CSW introduced self and explained her role at the hospital.  Pt reports social drinking with her friends, less than monthly. Pt denies substance use. Pt did not need resources.    CAGE-AID Screening:    Have You Ever Felt You Ought to Cut Down on Your Drinking or Drug Use?: No Have People Annoyed You By Critizing Your Drinking Or Drug Use?: No Have You Felt Bad Or Guilty About Your Drinking Or Drug Use?: No Have You Ever Had a Drink or Used Drugs First Thing In The Morning to Steady Your Nerves or to Get Rid of a Hangover?: No CAGE-AID Score: 0  Substance Abuse Education Offered: Yes     Blima Ledger, [place] Social Worker [phone]

## 2020-03-07 NOTE — Plan of Care (Signed)

## 2020-03-07 NOTE — Progress Notes (Signed)
Patient complains of left lower quadrant pain radiate to her back. Tylenol given and not wanting any stronger pain med at this time.

## 2020-03-08 ENCOUNTER — Encounter (HOSPITAL_COMMUNITY): Admission: EM | Disposition: A | Discharge status: Home / Self Care | Payer: Self-pay | Source: Home / Self Care

## 2020-03-08 ENCOUNTER — Inpatient Hospital Stay (HOSPITAL_COMMUNITY): Payer: BC Managed Care – PPO | Admitting: Anesthesiology

## 2020-03-08 HISTORY — PX: LAPAROTOMY: SHX154

## 2020-03-08 HISTORY — PX: LAPAROSCOPY: SHX197

## 2020-03-08 LAB — CBC
HCT: 27.3 % — ABNORMAL LOW (ref 36.0–46.0)
Hemoglobin: 8.6 g/dL — ABNORMAL LOW (ref 12.0–15.0)
MCH: 27.1 pg (ref 26.0–34.0)
MCHC: 31.5 g/dL (ref 30.0–36.0)
MCV: 86.1 fL (ref 80.0–100.0)
Platelets: 198 10*3/uL (ref 150–400)
RBC: 3.17 MIL/uL — ABNORMAL LOW (ref 3.87–5.11)
RDW: 13.9 % (ref 11.5–15.5)
WBC: 5.7 10*3/uL (ref 4.0–10.5)
nRBC: 0 % (ref 0.0–0.2)

## 2020-03-08 LAB — BASIC METABOLIC PANEL
Anion gap: 10 (ref 5–15)
BUN: 6 mg/dL (ref 6–20)
CO2: 22 mmol/L (ref 22–32)
Calcium: 8.8 mg/dL — ABNORMAL LOW (ref 8.9–10.3)
Chloride: 106 mmol/L (ref 98–111)
Creatinine, Ser: 0.82 mg/dL (ref 0.44–1.00)
GFR calc Af Amer: >60 mL/min (ref >60)
GFR calc non Af Amer: >60 mL/min (ref >60)
Glucose, Bld: 71 mg/dL (ref 70–99)
Potassium: 3.7 mmol/L (ref 3.5–5.1)
Sodium: 138 mmol/L (ref 135–145)

## 2020-03-08 LAB — PREPARE RBC (CROSSMATCH)

## 2020-03-08 LAB — SURGICAL PCR SCREEN
MRSA, PCR: NEGATIVE
Staphylococcus aureus: NEGATIVE

## 2020-03-08 SURGERY — LAPAROSCOPY, DIAGNOSTIC
Anesthesia: General | Site: Abdomen

## 2020-03-08 MED ORDER — CHLORHEXIDINE GLUCONATE CLOTH 2 % EX PADS
6.0000 | MEDICATED_PAD | Freq: Once | CUTANEOUS | Status: AC
  Administered 2020-03-08: 6 via TOPICAL

## 2020-03-08 MED ORDER — BUPIVACAINE HCL 0.25 % IJ SOLN
INTRAMUSCULAR | Status: DC | PRN
  Administered 2020-03-08: 10 mL

## 2020-03-08 MED ORDER — LIDOCAINE HCL (CARDIAC) PF 100 MG/5ML IV SOSY
PREFILLED_SYRINGE | INTRAVENOUS | Status: DC | PRN
  Administered 2020-03-08: 80 mg via INTRATRACHEAL

## 2020-03-08 MED ORDER — DEXAMETHASONE SODIUM PHOSPHATE 10 MG/ML IJ SOLN
INTRAMUSCULAR | Status: AC
  Filled 2020-03-08: qty 1

## 2020-03-08 MED ORDER — FENTANYL CITRATE (PF) 250 MCG/5ML IJ SOLN
INTRAMUSCULAR | Status: AC
  Filled 2020-03-08: qty 5

## 2020-03-08 MED ORDER — DEXMEDETOMIDINE (PRECEDEX) IN NS 20 MCG/5ML (4 MCG/ML) IV SYRINGE
PREFILLED_SYRINGE | INTRAVENOUS | Status: AC
  Filled 2020-03-08: qty 5

## 2020-03-08 MED ORDER — HYDROMORPHONE HCL 1 MG/ML IJ SOLN
INTRAMUSCULAR | Status: AC
  Filled 2020-03-08: qty 0.5

## 2020-03-08 MED ORDER — PROPOFOL 10 MG/ML IV BOLUS
INTRAVENOUS | Status: AC
  Filled 2020-03-08: qty 20

## 2020-03-08 MED ORDER — HYDROMORPHONE HCL 1 MG/ML IJ SOLN
INTRAMUSCULAR | Status: AC
  Filled 2020-03-08: qty 2

## 2020-03-08 MED ORDER — 0.9 % SODIUM CHLORIDE (POUR BTL) OPTIME
TOPICAL | Status: DC | PRN
  Administered 2020-03-08 (×4): 1000 mL

## 2020-03-08 MED ORDER — CHLORHEXIDINE GLUCONATE 0.12 % MT SOLN
OROMUCOSAL | Status: AC
  Administered 2020-03-08: 15 mL via OROMUCOSAL
  Filled 2020-03-08: qty 15

## 2020-03-08 MED ORDER — ONDANSETRON HCL 4 MG/2ML IJ SOLN
INTRAMUSCULAR | Status: DC | PRN
Start: 2020-03-08 — End: 2020-03-08
  Administered 2020-03-08: 4 mg via INTRAVENOUS

## 2020-03-08 MED ORDER — CHLORHEXIDINE GLUCONATE 0.12 % MT SOLN: 15.0000 mL | Freq: Once | OROMUCOSAL | Status: AC

## 2020-03-08 MED ORDER — DEXMEDETOMIDINE (PRECEDEX) IN NS 20 MCG/5ML (4 MCG/ML) IV SYRINGE
PREFILLED_SYRINGE | INTRAVENOUS | Status: DC | PRN
  Administered 2020-03-08: 8 ug via INTRAVENOUS
  Administered 2020-03-08: 4 ug via INTRAVENOUS

## 2020-03-08 MED ORDER — HYDROMORPHONE HCL 1 MG/ML IJ SOLN
0.2500 mg | INTRAMUSCULAR | Status: DC | PRN
  Administered 2020-03-08 (×4): 0.5 mg via INTRAVENOUS

## 2020-03-08 MED ORDER — HYDROMORPHONE HCL 1 MG/ML IJ SOLN
INTRAMUSCULAR | Status: DC | PRN
  Administered 2020-03-08 (×2): .5 mg via INTRAVENOUS

## 2020-03-08 MED ORDER — BUPIVACAINE HCL (PF) 0.25 % IJ SOLN
INTRAMUSCULAR | Status: AC
  Filled 2020-03-08: qty 30

## 2020-03-08 MED ORDER — CEFAZOLIN SODIUM-DEXTROSE 2-3 GM-%(50ML) IV SOLR
INTRAVENOUS | Status: DC | PRN
  Administered 2020-03-08: 2 g via INTRAVENOUS

## 2020-03-08 MED ORDER — SCOPOLAMINE 1 MG/3DAYS TD PT72
MEDICATED_PATCH | TRANSDERMAL | Status: AC
  Administered 2020-03-08: 1.5 mg via TRANSDERMAL
  Filled 2020-03-08: qty 1

## 2020-03-08 MED ORDER — ROCURONIUM BROMIDE 100 MG/10ML IV SOLN
INTRAVENOUS | Status: DC | PRN
Start: 2020-03-08 — End: 2020-03-08
  Administered 2020-03-08: 70 mg via INTRAVENOUS
  Administered 2020-03-08: 10 mg via INTRAVENOUS

## 2020-03-08 MED ORDER — BUPIVACAINE HCL (PF) 0.25 % IJ SOLN
INTRAMUSCULAR | Status: AC
  Filled 2020-03-08: qty 10

## 2020-03-08 MED ORDER — LACTATED RINGERS IV SOLN
INTRAVENOUS | Status: DC | PRN
Start: 2020-03-08 — End: 2020-03-08

## 2020-03-08 MED ORDER — SODIUM CHLORIDE 0.9 % IR SOLN
Status: DC | PRN
  Administered 2020-03-08: 1

## 2020-03-08 MED ORDER — ROCURONIUM BROMIDE 10 MG/ML (PF) SYRINGE
PREFILLED_SYRINGE | INTRAVENOUS | Status: AC
  Filled 2020-03-08: qty 10

## 2020-03-08 MED ORDER — SCOPOLAMINE 1 MG/3DAYS TD PT72: 1.0000 | MEDICATED_PATCH | TRANSDERMAL | Status: DC

## 2020-03-08 MED ORDER — PROMETHAZINE HCL 25 MG/ML IJ SOLN: 6.2500 mg | INTRAMUSCULAR | Status: DC | PRN

## 2020-03-08 MED ORDER — CEFAZOLIN SODIUM-DEXTROSE 2-4 GM/100ML-% IV SOLN
INTRAVENOUS | Status: AC
  Filled 2020-03-08: qty 100

## 2020-03-08 MED ORDER — PROPOFOL 10 MG/ML IV BOLUS
INTRAVENOUS | Status: DC | PRN
  Administered 2020-03-08: 150 mg via INTRAVENOUS

## 2020-03-08 MED ORDER — ONDANSETRON HCL 4 MG/2ML IJ SOLN
INTRAMUSCULAR | Status: AC
  Filled 2020-03-08: qty 2

## 2020-03-08 MED ORDER — MIDAZOLAM HCL 2 MG/2ML IJ SOLN
INTRAMUSCULAR | Status: AC
  Filled 2020-03-08: qty 2

## 2020-03-08 MED ORDER — LIDOCAINE 2% (20 MG/ML) 5 ML SYRINGE
INTRAMUSCULAR | Status: AC
  Filled 2020-03-08: qty 5

## 2020-03-08 MED ORDER — SUGAMMADEX SODIUM 200 MG/2ML IV SOLN
INTRAVENOUS | Status: DC | PRN
Start: 2020-03-08 — End: 2020-03-08
  Administered 2020-03-08: 200 mg via INTRAVENOUS

## 2020-03-08 MED ORDER — MIDAZOLAM HCL 5 MG/5ML IJ SOLN
INTRAMUSCULAR | Status: DC | PRN
  Administered 2020-03-08: 2 mg via INTRAVENOUS

## 2020-03-08 MED ORDER — DEXAMETHASONE SODIUM PHOSPHATE 10 MG/ML IJ SOLN
INTRAMUSCULAR | Status: DC | PRN
  Administered 2020-03-08: 10 mg via INTRAVENOUS

## 2020-03-08 MED ORDER — LACTATED RINGERS IV SOLN: INTRAVENOUS | Status: DC

## 2020-03-08 MED ORDER — FENTANYL CITRATE (PF) 250 MCG/5ML IJ SOLN
INTRAMUSCULAR | Status: DC | PRN
  Administered 2020-03-08: 50 ug via INTRAVENOUS
  Administered 2020-03-08 (×2): 100 ug via INTRAVENOUS
  Administered 2020-03-08 (×2): 50 ug via INTRAVENOUS

## 2020-03-08 SURGICAL SUPPLY — 38 items
BLADE CLIPPER SURG (BLADE) IMPLANT
CANISTER SUCT 3000ML PPV (MISCELLANEOUS) ×3 IMPLANT
CELLS DAT CNTRL 66122 CELL SVR (MISCELLANEOUS) ×2 IMPLANT
CHLORAPREP W/TINT 26 (MISCELLANEOUS) ×3 IMPLANT
COVER SURGICAL LIGHT HANDLE (MISCELLANEOUS) ×3 IMPLANT
COVER WAND RF STERILE (DRAPES) IMPLANT
DERMABOND ADHESIVE PROPEN (GAUZE/BANDAGES/DRESSINGS)
DERMABOND ADVANCED (GAUZE/BANDAGES/DRESSINGS) ×1
DERMABOND ADVANCED .7 DNX12 (GAUZE/BANDAGES/DRESSINGS) ×2 IMPLANT
DERMABOND ADVANCED .7 DNX6 (GAUZE/BANDAGES/DRESSINGS) IMPLANT
DRSG OPSITE POSTOP 4X6 (GAUZE/BANDAGES/DRESSINGS) ×3 IMPLANT
ELECT REM PT RETURN 9FT ADLT (ELECTROSURGICAL) ×3
ELECTRODE REM PT RTRN 9FT ADLT (ELECTROSURGICAL) ×2 IMPLANT
GLOVE BIO SURGEON STRL SZ 6.5 (GLOVE) ×3 IMPLANT
GLOVE BIOGEL PI IND STRL 6 (GLOVE) ×2 IMPLANT
GLOVE BIOGEL PI INDICATOR 6 (GLOVE) ×1
GOWN STRL REUS W/ TWL LRG LVL3 (GOWN DISPOSABLE) ×6 IMPLANT
GOWN STRL REUS W/TWL LRG LVL3 (GOWN DISPOSABLE) ×9
KIT BASIN OR (CUSTOM PROCEDURE TRAY) ×3 IMPLANT
KIT TURNOVER KIT B (KITS) ×3 IMPLANT
NEEDLE INSUFFLATION 14GA 120MM (NEEDLE) ×3 IMPLANT
NS IRRIG 1000ML POUR BTL (IV SOLUTION) ×6 IMPLANT
PACK UNIVERSAL I (CUSTOM PROCEDURE TRAY) ×3 IMPLANT
PAD ARMBOARD 7.5X6 YLW CONV (MISCELLANEOUS) ×6 IMPLANT
PENCIL SMOKE EVACUATOR (MISCELLANEOUS) ×3 IMPLANT
RTRCTR WOUND ALEXIS 18CM MED (MISCELLANEOUS) ×3
SCISSORS LAP 5X35 DISP (ENDOMECHANICALS) ×3 IMPLANT
SET IRRIG TUBING LAPAROSCOPIC (IRRIGATION / IRRIGATOR) IMPLANT
SET TUBE SMOKE EVAC HIGH FLOW (TUBING) ×3 IMPLANT
SLEEVE ENDOPATH XCEL 5M (ENDOMECHANICALS) ×3 IMPLANT
SUT MNCRL AB 4-0 PS2 18 (SUTURE) ×3 IMPLANT
TOWEL GREEN STERILE (TOWEL DISPOSABLE) ×3 IMPLANT
TOWEL GREEN STERILE FF (TOWEL DISPOSABLE) ×3 IMPLANT
TRAY LAPAROSCOPIC MC (CUSTOM PROCEDURE TRAY) ×3 IMPLANT
TROCAR XCEL BLUNT TIP 100MML (ENDOMECHANICALS) IMPLANT
TROCAR XCEL NON-BLD 5MMX100MML (ENDOMECHANICALS) ×3 IMPLANT
TUBE CONNECTING 12X1/4 (SUCTIONS) ×3 IMPLANT
YANKAUER SUCT BULB TIP NO VENT (SUCTIONS) ×3 IMPLANT
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
## 2020-03-08 NOTE — Anesthesia Procedure Notes (Signed)
Procedure Name: Intubation Date/Time: 03/08/2020 11:29 AM Performed by: Mariea Clonts, CRNA Pre-anesthesia Checklist: Patient identified, Emergency Drugs available, Suction available and Patient being monitored Patient Re-evaluated:Patient Re-evaluated prior to induction Oxygen Delivery Method: Circle System Utilized Preoxygenation: Pre-oxygenation with 100% oxygen Induction Type: IV induction Ventilation: Mask ventilation without difficulty Laryngoscope Size: Miller and 2 Grade View: Grade II Tube type: Oral Tube size: 7.0 mm Number of attempts: 1 Airway Equipment and Method: Stylet and Oral airway Placement Confirmation: ETT inserted through vocal cords under direct vision,  positive ETCO2 and breath sounds checked- equal and bilateral Tube secured with: Tape Dental Injury: Teeth and Oropharynx as per pre-operative assessment
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
## 2020-03-08 NOTE — Plan of Care (Signed)
  Problem: Education: Goal: Knowledge of General Education information will improve Description: Including pain rating scale, medication(s)/side effects and non-pharmacologic comfort measures Outcome: Progressing   Problem: Health Behavior/Discharge Planning: Goal: Ability to manage health-related needs will improve Outcome: Progressing   Problem: Pain Managment: Goal: General experience of comfort will improve Outcome: Progressing   

## 2020-03-08 NOTE — Discharge Instructions (Signed)
[facility]      [facility] [phone]  OPEN ABDOMINAL SURGERY: POST OP INSTRUCTIONS  Always review your discharge instruction sheet given to you by the facility where your surgery was performed.  IF YOU HAVE DISABILITY OR FAMILY LEAVE FORMS, YOU MUST BRING THEM TO THE OFFICE FOR PROCESSING.  PLEASE DO NOT GIVE THEM TO YOUR DOCTOR.  1. A prescription for pain medication may be given to you upon discharge.  Take your pain medication as prescribed, if needed.  If narcotic pain medicine is not needed, then you may take acetaminophen (Tylenol) or ibuprofen (Advil) as needed. 2. Take your usually prescribed medications unless otherwise directed. 3. If you need a refill on your pain medication, please contact your pharmacy. They will contact our office to request authorization.  Prescriptions will not be filled after 5pm or on week-ends. 4. You should follow a light diet the first few days after arrival home, such as soup and crackers, pudding, etc.unless your doctor has advised otherwise. A high-fiber, low fat diet can be resumed as tolerated.   Be sure to include lots of fluids daily. Most patients will experience some swelling and bruising on the chest and neck area.  Ice packs will help.  Swelling and bruising can take several days to resolve 5. Most patients will experience some swelling and bruising in the area of the incision. Ice pack will help. Swelling and bruising can take several days to resolve..  6. It is common to experience some constipation if taking pain medication after surgery.  Increasing fluid intake and taking a stool softener will usually help or prevent this problem from occurring.  A mild laxative (Milk of Magnesia or Miralax) should be taken according to package directions if there are no bowel movements after 48 hours. 7.  You may have steri-strips (small skin tapes) in place directly over the incision.  These strips should be left on the skin for 7-10 days.  If your  surgeon used skin glue on the incision, you may shower in 24 hours.  The glue will flake off over the next 2-3 weeks.  Any sutures or staples will be removed at the office during your follow-up visit. You may find that a light gauze bandage over your incision may keep your staples from being rubbed or pulled. You may shower and replace the bandage daily. 8. ACTIVITIES:  You may resume regular (light) daily activities beginning the next day--such as daily self-care, walking, climbing stairs--gradually increasing activities as tolerated.  You may have sexual intercourse when it is comfortable.  Refrain from any heavy lifting or straining until approved by your doctor. a. You may drive when you no longer are taking prescription pain medication, you can comfortably wear a seatbelt, and you can safely maneuver your car and apply brakes  9. You should see your doctor in the office for a follow-up appointment approximately two weeks after your surgery.  Make sure that you call for this appointment within a day or two after you arrive home to insure a convenient appointment time.  WHEN TO CALL YOUR DOCTOR: 1. Fever over 101.0 2. Inability to urinate 3. Nausea and/or vomiting 4. Extreme swelling or bruising 5. Continued bleeding from incision. 6. Increased pain, redness, or drainage from the incision. 7. Difficulty swallowing or breathing 8. Muscle cramping or spasms. 9. Numbness or tingling in hands or feet or around lips.  The clinic staff is available to answer your questions during regular business hours.  Please   don't hesitate to call and ask to speak to one of the nurses if you have concerns.  For further questions, please visit [email or web address]   
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
## 2020-03-09 LAB — CBC
HCT: 29.7 % — ABNORMAL LOW (ref 36.0–46.0)
Hemoglobin: 9.5 g/dL — ABNORMAL LOW (ref 12.0–15.0)
MCH: 27.4 pg (ref 26.0–34.0)
MCHC: 32 g/dL (ref 30.0–36.0)
MCV: 85.6 fL (ref 80.0–100.0)
Platelets: 237 10*3/uL (ref 150–400)
RBC: 3.47 MIL/uL — ABNORMAL LOW (ref 3.87–5.11)
RDW: 13.9 % (ref 11.5–15.5)
WBC: 7.1 10*3/uL (ref 4.0–10.5)
nRBC: 0 % (ref 0.0–0.2)

## 2020-03-09 MED ORDER — DIAZEPAM 5 MG PO TABS
5.0000 mg | ORAL_TABLET | Freq: Four times a day (QID) | ORAL | Status: DC | PRN
  Administered 2020-03-09: 5 mg via ORAL
  Filled 2020-03-09: qty 1

## 2020-03-09 NOTE — Plan of Care (Signed)

## 2020-03-09 NOTE — Anesthesia Postprocedure Evaluation (Signed)
Anesthesia Post Note  Patient: Alisha Reynolds  Procedure(s) Performed: LAPAROSCOPY DIAGNOSTIC (N/A Abdomen) EXPLORATORY LAPAROTOMY, REPAIR OF MESENTERY, ABDOMINAL WASHOUT (Abdomen)     Patient location during evaluation: PACU Anesthesia Type: General Level of consciousness: awake and alert Pain management: pain level controlled Vital Signs Assessment: post-procedure vital signs reviewed and stable Respiratory status: spontaneous breathing, nonlabored ventilation, respiratory function stable and patient connected to nasal cannula oxygen Cardiovascular status: blood pressure returned to baseline and stable Postop Assessment: no apparent nausea or vomiting Anesthetic complications: no   No complications documented.  Last Vitals:  Vitals:   03/09/20 0718 03/09/20 1452  BP: 111/72 120/60  Pulse: 97 89  Resp: 16 17  Temp: 36.9 C 36.8 C  SpO2: 99% 100%    Last Pain:  Vitals:   03/09/20 1549  TempSrc:   PainSc: 1                  Aza Dantes COKER
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
## 2020-03-09 NOTE — Plan of Care (Signed)
  Problem: Education: Goal: Knowledge of General Education information will improve Description: Including pain rating scale, medication(s)/side effects and non-pharmacologic comfort measures Outcome: Progressing   Problem: Health Behavior/Discharge Planning: Goal: Ability to manage health-related needs will improve Outcome: Progressing   Problem: Activity: Goal: Risk for activity intolerance will decrease Outcome: Progressing   Problem: Nutrition: Goal: Adequate nutrition will be maintained Outcome: Progressing   Problem: Elimination: Goal: Will not experience complications related to bowel motility Outcome: Progressing   Problem: Pain Managment: Goal: General experience of comfort will improve Outcome: Progressing   

## 2020-03-09 NOTE — Progress Notes (Addendum)
OT Cancellation Note  Patient Details Name: RAEDYN KLINCK MRN: [id number] DOB: 09-22-1995   Cancelled Treatment:    Reason Eval/Treat Not Completed: Other (comment). Pt awaiting pain meds, r/o 7/10 abdominal pain. Plan to reattempt once premedicated to assess transfers/mobility.  Tyrone Schimke, OT Acute Rehabilitation Services Pager: [phone] Office: [phone]  03/09/2020, 9:47 AM  Second attempt made at 11:08. Pt now with pain meds on board but reports feeling worse in the past hour. Pt with eyes closed throughout encounter. Mother present and reports doctor has been in and labs ordered.

## 2020-03-10 ENCOUNTER — Encounter (HOSPITAL_COMMUNITY): Payer: Self-pay | Admitting: Surgery

## 2020-03-10 MED ORDER — ENOXAPARIN SODIUM 30 MG/0.3ML ~~LOC~~ SOLN
30.0000 mg | Freq: Two times a day (BID) | SUBCUTANEOUS | Status: DC
  Administered 2020-03-10 – 2020-03-11 (×4): 30 mg via SUBCUTANEOUS
  Filled 2020-03-10 (×4): qty 0.3
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
## 2020-03-10 NOTE — Plan of Care (Signed)
  Problem: Education: Goal: Knowledge of General Education information will improve Description: Including pain rating scale, medication(s)/side effects and non-pharmacologic comfort measures Outcome: Progressing   Problem: Pain Managment: Goal: General experience of comfort will improve Outcome: Progressing   Problem: Elimination: Goal: Will not experience complications related to bowel motility Outcome: Progressing   Problem: Activity: Goal: Risk for activity intolerance will decrease Outcome: Progressing   Problem: Nutrition: Goal: Adequate nutrition will be maintained Outcome: Progressing   Problem: Safety: Goal: Ability to remain free from injury will improve Outcome: Progressing   Problem: Skin Integrity: Goal: Risk for impaired skin integrity will decrease Outcome: Progressing
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
## 2020-03-11 LAB — TYPE AND SCREEN
ABO/RH(D): A POS
Antibody Screen: NEGATIVE
Unit division: 0
Unit division: 0

## 2020-03-11 LAB — BASIC METABOLIC PANEL
Anion gap: 8 (ref 5–15)
BUN: <5 mg/dL — ABNORMAL LOW (ref 6–20)
CO2: 26 mmol/L (ref 22–32)
Calcium: 8.7 mg/dL — ABNORMAL LOW (ref 8.9–10.3)
Chloride: 104 mmol/L (ref 98–111)
Creatinine, Ser: 0.69 mg/dL (ref 0.44–1.00)
GFR calc Af Amer: >60 mL/min (ref >60)
GFR calc non Af Amer: >60 mL/min (ref >60)
Glucose, Bld: 80 mg/dL (ref 70–99)
Potassium: 3.5 mmol/L (ref 3.5–5.1)
Sodium: 138 mmol/L (ref 135–145)

## 2020-03-11 LAB — BPAM RBC
Blood Product Expiration Date: 202109022359
Blood Product Expiration Date: 202109022359
Unit Type and Rh: 6200
Unit Type and Rh: 6200

## 2020-03-11 LAB — CBC
HCT: 27.7 % — ABNORMAL LOW (ref 36.0–46.0)
Hemoglobin: 8.8 g/dL — ABNORMAL LOW (ref 12.0–15.0)
MCH: 27.2 pg (ref 26.0–34.0)
MCHC: 31.8 g/dL (ref 30.0–36.0)
MCV: 85.8 fL (ref 80.0–100.0)
Platelets: 228 10*3/uL (ref 150–400)
RBC: 3.23 MIL/uL — ABNORMAL LOW (ref 3.87–5.11)
RDW: 14.1 % (ref 11.5–15.5)
WBC: 6 10*3/uL (ref 4.0–10.5)
nRBC: 0 % (ref 0.0–0.2)

## 2020-03-11 MED ORDER — ACETAMINOPHEN 325 MG PO TABS: 650.0000 mg | ORAL_TABLET | Freq: Four times a day (QID) | ORAL | Status: DC | PRN

## 2020-03-11 MED ORDER — MAGNESIUM CITRATE PO SOLN
0.5000 | Freq: Once | ORAL | Status: AC
  Administered 2020-03-11: 0.5 via ORAL
  Filled 2020-03-11: qty 296

## 2020-03-11 MED ORDER — OXYCODONE HCL 5 MG PO TABS: 5.0000 mg | ORAL_TABLET | Freq: Four times a day (QID) | ORAL | 0 refills | Status: DC | PRN

## 2020-03-11 MED ORDER — DOCUSATE SODIUM 100 MG PO CAPS: 100.0000 mg | ORAL_CAPSULE | Freq: Every day | ORAL | Status: DC | PRN
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
## 2020-03-11 NOTE — Plan of Care (Signed)
  Problem: Health Behavior/Discharge Planning: Goal: Ability to manage health-related needs will improve Outcome: Progressing   Problem: Activity: Goal: Risk for activity intolerance will decrease Outcome: Progressing   Problem: Elimination: Goal: Will not experience complications related to bowel motility Outcome: Not Progressing
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
## 2020-03-11 NOTE — Plan of Care (Signed)
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
## 2020-03-12 NOTE — Progress Notes (Signed)
Pt given discharge instructions and gone over with her. She verbalized understanding. All questions answered. Walker delivered to room. Pt's mom transporting her home.

## 2020-03-12 NOTE — Plan of Care (Signed)

## 2020-03-12 NOTE — Plan of Care (Signed)
  Problem: Health Behavior/Discharge Planning: Goal: Ability to manage health-related needs will improve Outcome: Adequate for Discharge   Problem: Activity: Goal: Risk for activity intolerance will decrease Outcome: Adequate for Discharge   Problem: Elimination: Goal: Will not experience complications related to bowel motility Outcome: Adequate for Discharge   Problem: Pain Managment: Goal: General experience of comfort will improve Outcome: Adequate for Discharge

## 2020-03-15 DIAGNOSIS — Z471 Aftercare following joint replacement surgery: Secondary | ICD-10-CM | POA: Insufficient documentation

## 2020-03-15 DIAGNOSIS — Z87828 Personal history of other (healed) physical injury and trauma: Secondary | ICD-10-CM | POA: Insufficient documentation

## 2020-03-24 ENCOUNTER — Emergency Department (HOSPITAL_BASED_OUTPATIENT_CLINIC_OR_DEPARTMENT_OTHER)
Admission: EM | Admit: 2020-03-24 | Discharge: 2020-03-24 | Disposition: A | Discharge status: Home / Self Care | Payer: BC Managed Care – PPO | Attending: Emergency Medicine | Admitting: Emergency Medicine

## 2020-03-24 ENCOUNTER — Encounter (HOSPITAL_BASED_OUTPATIENT_CLINIC_OR_DEPARTMENT_OTHER): Payer: Self-pay | Admitting: *Deleted

## 2020-03-24 ENCOUNTER — Other Ambulatory Visit: Payer: Self-pay

## 2020-03-24 ENCOUNTER — Emergency Department (HOSPITAL_BASED_OUTPATIENT_CLINIC_OR_DEPARTMENT_OTHER): Payer: BC Managed Care – PPO

## 2020-03-24 DIAGNOSIS — J45909 Unspecified asthma, uncomplicated: Secondary | ICD-10-CM | POA: Diagnosis not present

## 2020-03-24 DIAGNOSIS — Y999 Unspecified external cause status: Secondary | ICD-10-CM | POA: Diagnosis not present

## 2020-03-24 DIAGNOSIS — S46912A Strain of unspecified muscle, fascia and tendon at shoulder and upper arm level, left arm, initial encounter: Secondary | ICD-10-CM

## 2020-03-24 DIAGNOSIS — Z91010 Allergy to peanuts: Secondary | ICD-10-CM | POA: Diagnosis not present

## 2020-03-24 DIAGNOSIS — Y929 Unspecified place or not applicable: Secondary | ICD-10-CM | POA: Insufficient documentation

## 2020-03-24 DIAGNOSIS — Y939 Activity, unspecified: Secondary | ICD-10-CM | POA: Diagnosis not present

## 2020-03-24 DIAGNOSIS — S46012A Strain of muscle(s) and tendon(s) of the rotator cuff of left shoulder, initial encounter: Secondary | ICD-10-CM | POA: Diagnosis present

## 2020-03-24 DIAGNOSIS — Z79899 Other long term (current) drug therapy: Secondary | ICD-10-CM | POA: Diagnosis not present

## 2020-03-24 LAB — URINALYSIS, ROUTINE W REFLEX MICROSCOPIC
Bilirubin Urine: NEGATIVE
Glucose, UA: NEGATIVE mg/dL
Hgb urine dipstick: NEGATIVE
Ketones, ur: NEGATIVE mg/dL
Leukocytes,Ua: NEGATIVE
Nitrite: NEGATIVE
Protein, ur: NEGATIVE mg/dL
Specific Gravity, Urine: 1.01 (ref 1.005–1.030)
pH: 6 (ref 5.0–8.0)

## 2020-03-24 LAB — PREGNANCY, URINE: Preg Test, Ur: NEGATIVE

## 2020-03-24 NOTE — ED Notes (Signed)
ED Provider at bedside. 
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
## 2020-05-18 ENCOUNTER — Other Ambulatory Visit: Payer: Self-pay

## 2020-05-18 ENCOUNTER — Emergency Department (HOSPITAL_COMMUNITY): Payer: BC Managed Care – PPO

## 2020-05-18 ENCOUNTER — Emergency Department (HOSPITAL_COMMUNITY)
Admission: EM | Admit: 2020-05-18 | Discharge: 2020-05-18 | Disposition: A | Discharge status: Home / Self Care | Payer: BC Managed Care – PPO | Attending: Emergency Medicine | Admitting: Emergency Medicine

## 2020-05-18 DIAGNOSIS — R1032 Left lower quadrant pain: Secondary | ICD-10-CM | POA: Diagnosis not present

## 2020-05-18 DIAGNOSIS — R110 Nausea: Secondary | ICD-10-CM | POA: Insufficient documentation

## 2020-05-18 DIAGNOSIS — J45909 Unspecified asthma, uncomplicated: Secondary | ICD-10-CM | POA: Diagnosis not present

## 2020-05-18 LAB — URINALYSIS, ROUTINE W REFLEX MICROSCOPIC
Bilirubin Urine: NEGATIVE
Glucose, UA: NEGATIVE mg/dL
Ketones, ur: 5 mg/dL — AB
Leukocytes,Ua: NEGATIVE
Nitrite: NEGATIVE
Protein, ur: NEGATIVE mg/dL
Specific Gravity, Urine: 1.025 (ref 1.005–1.030)
pH: 6 (ref 5.0–8.0)

## 2020-05-18 LAB — WET PREP, GENITAL
Clue Cells Wet Prep HPF POC: NONE SEEN
Sperm: NONE SEEN
Trich, Wet Prep: NONE SEEN
Yeast Wet Prep HPF POC: NONE SEEN

## 2020-05-18 LAB — CBC
HCT: 38 % (ref 36.0–46.0)
Hemoglobin: 12.2 g/dL (ref 12.0–15.0)
MCH: 27.3 pg (ref 26.0–34.0)
MCHC: 32.1 g/dL (ref 30.0–36.0)
MCV: 85 fL (ref 80.0–100.0)
Platelets: 360 10*3/uL (ref 150–400)
RBC: 4.47 MIL/uL (ref 3.87–5.11)
RDW: 14.2 % (ref 11.5–15.5)
WBC: 9.7 10*3/uL (ref 4.0–10.5)
nRBC: 0 % (ref 0.0–0.2)

## 2020-05-18 LAB — COMPREHENSIVE METABOLIC PANEL
ALT: 20 U/L (ref 0–44)
AST: 23 U/L (ref 15–41)
Albumin: 4.4 g/dL (ref 3.5–5.0)
Alkaline Phosphatase: 102 U/L (ref 38–126)
Anion gap: 14 (ref 5–15)
BUN: 10 mg/dL (ref 6–20)
CO2: 21 mmol/L — ABNORMAL LOW (ref 22–32)
Calcium: 10.1 mg/dL (ref 8.9–10.3)
Chloride: 101 mmol/L (ref 98–111)
Creatinine, Ser: 0.96 mg/dL (ref 0.44–1.00)
GFR, Estimated: >60 mL/min (ref >60)
Glucose, Bld: 175 mg/dL — ABNORMAL HIGH (ref 70–99)
Potassium: 3.7 mmol/L (ref 3.5–5.1)
Sodium: 136 mmol/L (ref 135–145)
Total Bilirubin: 0.8 mg/dL (ref 0.3–1.2)
Total Protein: 7.8 g/dL (ref 6.5–8.1)

## 2020-05-18 LAB — I-STAT BETA HCG BLOOD, ED (MC, WL, AP ONLY): I-stat hCG, quantitative: <5 m[IU]/mL (ref <5)

## 2020-05-18 LAB — LIPASE, BLOOD: Lipase: 22 U/L (ref 11–51)

## 2020-05-18 MED ORDER — ACETAMINOPHEN 500 MG PO TABS
1000.0000 mg | ORAL_TABLET | Freq: Once | ORAL | Status: AC
  Administered 2020-05-18: 1000 mg via ORAL
  Filled 2020-05-18: qty 2

## 2020-05-18 MED ORDER — DICYCLOMINE HCL 20 MG PO TABS: 20.0000 mg | ORAL_TABLET | Freq: Two times a day (BID) | ORAL | 0 refills | Status: DC | PRN

## 2020-05-18 MED ORDER — IOHEXOL 300 MG/ML  SOLN
100.0000 mL | Freq: Once | INTRAMUSCULAR | Status: AC | PRN
  Administered 2020-05-18: 100 mL via INTRAVENOUS

## 2020-05-18 MED ORDER — SODIUM CHLORIDE 0.9 % IV BOLUS
1000.0000 mL | Freq: Once | INTRAVENOUS | Status: AC
  Administered 2020-05-18: 1000 mL via INTRAVENOUS

## 2020-05-18 MED ORDER — MORPHINE SULFATE (PF) 4 MG/ML IV SOLN
4.0000 mg | Freq: Once | INTRAVENOUS | Status: AC
  Administered 2020-05-18: 4 mg via INTRAVENOUS
  Filled 2020-05-18: qty 1

## 2020-05-18 MED ORDER — ONDANSETRON HCL 4 MG/2ML IJ SOLN
4.0000 mg | Freq: Once | INTRAMUSCULAR | Status: AC
  Administered 2020-05-18: 4 mg via INTRAVENOUS
  Filled 2020-05-18: qty 2

## 2020-05-18 MED ORDER — DICYCLOMINE HCL 10 MG PO CAPS
10.0000 mg | ORAL_CAPSULE | Freq: Once | ORAL | Status: AC
  Administered 2020-05-18: 10 mg via ORAL
  Filled 2020-05-18: qty 1

## 2020-05-18 NOTE — ED Triage Notes (Signed)
Pt. Stated, Im having pain on left side with nausea started at 400 this morning.

## 2020-05-18 NOTE — ED Notes (Signed)
MD, PA & Observer to bedside for pelvic exam.

## 2020-05-18 NOTE — ED Notes (Signed)
Pt resting in bed. ABD pain improved.

## 2020-05-18 NOTE — ED Notes (Signed)
Pt eating per MD direction. Tolerating PO intake w/o NV

## 2020-05-18 NOTE — Discharge Instructions (Signed)
Take Bentyl as needed for abdominal pain or cramping. Use Tylenol ibuprofen as needed for pain or fever. Use Zofran as needed for nausea or vomiting. Nutrition well-hydrated water. Follow-up with your primary care doctor as needed for further evaluation of your symptoms. Return to the emergency room with persistent high fevers, persistent vomiting, severe worsening pain, or any new, worsening, or concerning symptoms.
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
## 2020-05-20 LAB — GC/CHLAMYDIA PROBE AMP (~~LOC~~) NOT AT ARMC
Chlamydia: NEGATIVE
Comment: NEGATIVE
Comment: NORMAL
Neisseria Gonorrhea: NEGATIVE

## 2020-06-13 ENCOUNTER — Encounter (HOSPITAL_COMMUNITY): Payer: Self-pay

## 2020-06-13 ENCOUNTER — Emergency Department (HOSPITAL_COMMUNITY)
Admission: EM | Admit: 2020-06-13 | Discharge: 2020-06-14 | Disposition: A | Discharge status: Home / Self Care | Payer: BC Managed Care – PPO | Attending: Emergency Medicine | Admitting: Emergency Medicine

## 2020-06-13 DIAGNOSIS — J4520 Mild intermittent asthma, uncomplicated: Secondary | ICD-10-CM | POA: Insufficient documentation

## 2020-06-13 DIAGNOSIS — R102 Pelvic and perineal pain: Secondary | ICD-10-CM | POA: Diagnosis not present

## 2020-06-13 DIAGNOSIS — R55 Syncope and collapse: Secondary | ICD-10-CM | POA: Insufficient documentation

## 2020-06-13 DIAGNOSIS — Z91010 Allergy to peanuts: Secondary | ICD-10-CM | POA: Diagnosis not present

## 2020-06-13 LAB — CBC
HCT: 34 % — ABNORMAL LOW (ref 36.0–46.0)
Hemoglobin: 10.7 g/dL — ABNORMAL LOW (ref 12.0–15.0)
MCH: 27 pg (ref 26.0–34.0)
MCHC: 31.5 g/dL (ref 30.0–36.0)
MCV: 85.9 fL (ref 80.0–100.0)
Platelets: 306 10*3/uL (ref 150–400)
RBC: 3.96 MIL/uL (ref 3.87–5.11)
RDW: 14.1 % (ref 11.5–15.5)
WBC: 7.3 10*3/uL (ref 4.0–10.5)
nRBC: 0 % (ref 0.0–0.2)

## 2020-06-13 LAB — BASIC METABOLIC PANEL
Anion gap: 10 (ref 5–15)
BUN: 10 mg/dL (ref 6–20)
CO2: 24 mmol/L (ref 22–32)
Calcium: 9.2 mg/dL (ref 8.9–10.3)
Chloride: 106 mmol/L (ref 98–111)
Creatinine, Ser: 0.81 mg/dL (ref 0.44–1.00)
GFR, Estimated: >60 mL/min (ref >60)
Glucose, Bld: 100 mg/dL — ABNORMAL HIGH (ref 70–99)
Potassium: 3.7 mmol/L (ref 3.5–5.1)
Sodium: 140 mmol/L (ref 135–145)

## 2020-06-13 LAB — I-STAT BETA HCG BLOOD, ED (MC, WL, AP ONLY): I-stat hCG, quantitative: <5 m[IU]/mL (ref <5)

## 2020-06-13 NOTE — ED Triage Notes (Signed)
Pt comes via [facility] for several syncopal episodes, pt took home pregnancy test at home was + then started having vaginal bleeding in the past week, tonight was having intercourse and had two syncopal episodes. Pt having LLQ pain

## 2020-06-14 ENCOUNTER — Emergency Department (HOSPITAL_COMMUNITY): Payer: BC Managed Care – PPO

## 2020-06-14 ENCOUNTER — Other Ambulatory Visit: Payer: Self-pay

## 2020-06-14 LAB — URINALYSIS, ROUTINE W REFLEX MICROSCOPIC
Bilirubin Urine: NEGATIVE
Glucose, UA: NEGATIVE mg/dL
Hgb urine dipstick: NEGATIVE
Ketones, ur: NEGATIVE mg/dL
Leukocytes,Ua: NEGATIVE
Nitrite: NEGATIVE
Protein, ur: NEGATIVE mg/dL
Specific Gravity, Urine: 1.023 (ref 1.005–1.030)
pH: 7 (ref 5.0–8.0)

## 2020-06-14 MED ORDER — KETOROLAC TROMETHAMINE 30 MG/ML IJ SOLN
15.0000 mg | Freq: Once | INTRAMUSCULAR | Status: AC
  Administered 2020-06-14: 15 mg via INTRAVENOUS
  Filled 2020-06-14: qty 1

## 2020-06-14 NOTE — Discharge Instructions (Addendum)
Call your OB/GYN doctor later today to schedule follow-up.
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
## 2020-06-17 ENCOUNTER — Emergency Department (HOSPITAL_COMMUNITY)
Admission: EM | Admit: 2020-06-17 | Discharge: 2020-06-17 | Disposition: A | Discharge status: Home / Self Care | Payer: BC Managed Care – PPO | Attending: Emergency Medicine | Admitting: Emergency Medicine

## 2020-06-17 ENCOUNTER — Other Ambulatory Visit: Payer: Self-pay

## 2020-06-17 DIAGNOSIS — R531 Weakness: Secondary | ICD-10-CM | POA: Insufficient documentation

## 2020-06-17 DIAGNOSIS — Z5321 Procedure and treatment not carried out due to patient leaving prior to being seen by health care provider: Secondary | ICD-10-CM | POA: Insufficient documentation

## 2020-06-17 DIAGNOSIS — R110 Nausea: Secondary | ICD-10-CM | POA: Diagnosis not present

## 2020-06-17 DIAGNOSIS — R109 Unspecified abdominal pain: Secondary | ICD-10-CM | POA: Diagnosis present

## 2020-06-17 LAB — CBC
HCT: 40.6 % (ref 36.0–46.0)
Hemoglobin: 12.6 g/dL (ref 12.0–15.0)
MCH: 26.6 pg (ref 26.0–34.0)
MCHC: 31 g/dL (ref 30.0–36.0)
MCV: 85.8 fL (ref 80.0–100.0)
Platelets: 363 10*3/uL (ref 150–400)
RBC: 4.73 MIL/uL (ref 3.87–5.11)
RDW: 13.9 % (ref 11.5–15.5)
WBC: 8.2 10*3/uL (ref 4.0–10.5)
nRBC: 0 % (ref 0.0–0.2)

## 2020-06-17 LAB — I-STAT BETA HCG BLOOD, ED (MC, WL, AP ONLY): I-stat hCG, quantitative: <5 m[IU]/mL (ref <5)

## 2020-06-17 LAB — COMPREHENSIVE METABOLIC PANEL
ALT: 23 U/L (ref 0–44)
AST: 20 U/L (ref 15–41)
Albumin: 4.4 g/dL (ref 3.5–5.0)
Alkaline Phosphatase: 90 U/L (ref 38–126)
Anion gap: 10 (ref 5–15)
BUN: 10 mg/dL (ref 6–20)
CO2: 24 mmol/L (ref 22–32)
Calcium: 9.9 mg/dL (ref 8.9–10.3)
Chloride: 103 mmol/L (ref 98–111)
Creatinine, Ser: 0.74 mg/dL (ref 0.44–1.00)
GFR, Estimated: >60 mL/min (ref >60)
Glucose, Bld: 84 mg/dL (ref 70–99)
Potassium: 3.8 mmol/L (ref 3.5–5.1)
Sodium: 137 mmol/L (ref 135–145)
Total Bilirubin: 0.4 mg/dL (ref 0.3–1.2)
Total Protein: 7.7 g/dL (ref 6.5–8.1)

## 2020-06-17 LAB — LIPASE, BLOOD: Lipase: 27 U/L (ref 11–51)

## 2020-06-17 LAB — URINALYSIS, ROUTINE W REFLEX MICROSCOPIC
Bilirubin Urine: NEGATIVE
Glucose, UA: NEGATIVE mg/dL
Hgb urine dipstick: NEGATIVE
Ketones, ur: 20 mg/dL — AB
Leukocytes,Ua: NEGATIVE
Nitrite: NEGATIVE
Protein, ur: NEGATIVE mg/dL
Specific Gravity, Urine: 1.025 (ref 1.005–1.030)
pH: 6 (ref 5.0–8.0)

## 2020-06-17 NOTE — ED Triage Notes (Signed)
Pt. Stated, Alisha Reynolds had stomach pain with nausea started a week ago.

## 2020-06-17 NOTE — ED Notes (Signed)
NA X2 

## 2020-06-30 ENCOUNTER — Emergency Department (HOSPITAL_COMMUNITY)
Admission: EM | Admit: 2020-06-30 | Discharge: 2020-07-01 | Disposition: A | Discharge status: Home / Self Care | Payer: BC Managed Care – PPO | Attending: Emergency Medicine | Admitting: Emergency Medicine

## 2020-06-30 ENCOUNTER — Emergency Department (HOSPITAL_COMMUNITY): Payer: BC Managed Care – PPO

## 2020-06-30 ENCOUNTER — Encounter (HOSPITAL_COMMUNITY): Payer: Self-pay | Admitting: Obstetrics and Gynecology

## 2020-06-30 ENCOUNTER — Other Ambulatory Visit: Payer: Self-pay

## 2020-06-30 DIAGNOSIS — N938 Other specified abnormal uterine and vaginal bleeding: Secondary | ICD-10-CM

## 2020-06-30 DIAGNOSIS — R109 Unspecified abdominal pain: Secondary | ICD-10-CM | POA: Insufficient documentation

## 2020-06-30 DIAGNOSIS — Z91010 Allergy to peanuts: Secondary | ICD-10-CM | POA: Diagnosis not present

## 2020-06-30 DIAGNOSIS — J45909 Unspecified asthma, uncomplicated: Secondary | ICD-10-CM | POA: Insufficient documentation

## 2020-06-30 DIAGNOSIS — N939 Abnormal uterine and vaginal bleeding, unspecified: Secondary | ICD-10-CM | POA: Insufficient documentation

## 2020-06-30 LAB — CBC
HCT: 37.7 % (ref 36.0–46.0)
Hemoglobin: 12 g/dL (ref 12.0–15.0)
MCH: 27.3 pg (ref 26.0–34.0)
MCHC: 31.8 g/dL (ref 30.0–36.0)
MCV: 85.7 fL (ref 80.0–100.0)
Platelets: 319 10*3/uL (ref 150–400)
RBC: 4.4 MIL/uL (ref 3.87–5.11)
RDW: 13.9 % (ref 11.5–15.5)
WBC: 6.9 10*3/uL (ref 4.0–10.5)
nRBC: 0 % (ref 0.0–0.2)

## 2020-06-30 LAB — COMPREHENSIVE METABOLIC PANEL
ALT: 27 U/L (ref 0–44)
AST: 22 U/L (ref 15–41)
Albumin: 4.6 g/dL (ref 3.5–5.0)
Alkaline Phosphatase: 105 U/L (ref 38–126)
Anion gap: 12 (ref 5–15)
BUN: 9 mg/dL (ref 6–20)
CO2: 25 mmol/L (ref 22–32)
Calcium: 9.7 mg/dL (ref 8.9–10.3)
Chloride: 102 mmol/L (ref 98–111)
Creatinine, Ser: 0.73 mg/dL (ref 0.44–1.00)
GFR, Estimated: >60 mL/min (ref >60)
Glucose, Bld: 76 mg/dL (ref 70–99)
Potassium: 3.9 mmol/L (ref 3.5–5.1)
Sodium: 139 mmol/L (ref 135–145)
Total Bilirubin: 0.7 mg/dL (ref 0.3–1.2)
Total Protein: 7.9 g/dL (ref 6.5–8.1)

## 2020-06-30 LAB — HEMOGLOBIN AND HEMATOCRIT, BLOOD
HCT: 33.6 % — ABNORMAL LOW (ref 36.0–46.0)
Hemoglobin: 10.8 g/dL — ABNORMAL LOW (ref 12.0–15.0)

## 2020-06-30 LAB — I-STAT BETA HCG BLOOD, ED (MC, WL, AP ONLY): I-stat hCG, quantitative: <5 m[IU]/mL (ref <5)

## 2020-06-30 LAB — WET PREP, GENITAL
Clue Cells Wet Prep HPF POC: NONE SEEN
Sperm: NONE SEEN
Trich, Wet Prep: NONE SEEN
Yeast Wet Prep HPF POC: NONE SEEN

## 2020-06-30 MED ORDER — NAPROXEN 500 MG PO TABS
500.0000 mg | ORAL_TABLET | Freq: Once | ORAL | Status: AC
  Administered 2020-06-30: 500 mg via ORAL
  Filled 2020-06-30: qty 1
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
## 2020-06-30 NOTE — Discharge Instructions (Addendum)
-  Prescription sent to your pharmacy for Megace. This is a medicine to help control your bleeding. Take it until you follow up with your gyn.  Your [place] today showed thickened endometrial stripe measuring 3.6 mm. Your gyn can request the records from your ED visit if they cannot see them in the computer.  Return to the emergency department if you have any new or worsening symptoms including shortness of breath, weakness, feel like you are going to pass out.  Thank you for allowing [place] to care for you today.
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
## 2020-07-01 LAB — GC/CHLAMYDIA PROBE AMP (~~LOC~~) NOT AT ARMC
Chlamydia: NEGATIVE
Comment: NEGATIVE
Comment: NORMAL
Neisseria Gonorrhea: NEGATIVE

## 2020-07-01 MED ORDER — MEGESTROL ACETATE 20 MG PO TABS: 20.0000 mg | ORAL_TABLET | Freq: Two times a day (BID) | ORAL | 0 refills | Status: DC

## 2020-07-08 ENCOUNTER — Other Ambulatory Visit: Payer: Self-pay

## 2020-07-08 ENCOUNTER — Ambulatory Visit: Payer: BC Managed Care – PPO | Admitting: Family Medicine

## 2020-07-08 DIAGNOSIS — M542 Cervicalgia: Secondary | ICD-10-CM

## 2020-07-08 DIAGNOSIS — M25512 Pain in left shoulder: Secondary | ICD-10-CM | POA: Diagnosis not present

## 2020-07-08 DIAGNOSIS — G8929 Other chronic pain: Secondary | ICD-10-CM | POA: Diagnosis not present
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
## 2020-07-16 ENCOUNTER — Other Ambulatory Visit: Payer: Self-pay

## 2020-07-16 ENCOUNTER — Encounter: Payer: Self-pay | Admitting: Family Medicine

## 2020-07-16 ENCOUNTER — Ambulatory Visit (INDEPENDENT_AMBULATORY_CARE_PROVIDER_SITE_OTHER): Payer: BC Managed Care – PPO | Admitting: Family Medicine

## 2020-07-16 DIAGNOSIS — G8929 Other chronic pain: Secondary | ICD-10-CM

## 2020-07-16 DIAGNOSIS — M25512 Pain in left shoulder: Secondary | ICD-10-CM

## 2020-07-16 DIAGNOSIS — M542 Cervicalgia: Secondary | ICD-10-CM | POA: Diagnosis not present
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
## 2020-07-16 NOTE — Patient Instructions (Signed)
    Vitamin D3:  5,000 IU daily    

## 2020-07-20 ENCOUNTER — Other Ambulatory Visit: Payer: Self-pay

## 2020-07-20 ENCOUNTER — Emergency Department (HOSPITAL_COMMUNITY): Payer: BC Managed Care – PPO

## 2020-07-20 ENCOUNTER — Encounter (HOSPITAL_COMMUNITY): Payer: Self-pay

## 2020-07-20 ENCOUNTER — Emergency Department (HOSPITAL_COMMUNITY)
Admission: EM | Admit: 2020-07-20 | Discharge: 2020-07-20 | Disposition: A | Discharge status: Home / Self Care | Payer: BC Managed Care – PPO | Attending: Emergency Medicine | Admitting: Emergency Medicine

## 2020-07-20 DIAGNOSIS — Z91010 Allergy to peanuts: Secondary | ICD-10-CM | POA: Insufficient documentation

## 2020-07-20 DIAGNOSIS — R300 Dysuria: Secondary | ICD-10-CM | POA: Diagnosis not present

## 2020-07-20 DIAGNOSIS — R1031 Right lower quadrant pain: Secondary | ICD-10-CM | POA: Diagnosis not present

## 2020-07-20 DIAGNOSIS — J4520 Mild intermittent asthma, uncomplicated: Secondary | ICD-10-CM | POA: Diagnosis not present

## 2020-07-20 DIAGNOSIS — Z96641 Presence of right artificial hip joint: Secondary | ICD-10-CM | POA: Insufficient documentation

## 2020-07-20 DIAGNOSIS — Z20822 Contact with and (suspected) exposure to covid-19: Secondary | ICD-10-CM | POA: Insufficient documentation

## 2020-07-20 DIAGNOSIS — R110 Nausea: Secondary | ICD-10-CM | POA: Insufficient documentation

## 2020-07-20 DIAGNOSIS — R109 Unspecified abdominal pain: Secondary | ICD-10-CM | POA: Diagnosis present

## 2020-07-20 LAB — COMPREHENSIVE METABOLIC PANEL
ALT: 20 U/L (ref 0–44)
AST: 20 U/L (ref 15–41)
Albumin: 4.1 g/dL (ref 3.5–5.0)
Alkaline Phosphatase: 77 U/L (ref 38–126)
Anion gap: 9 (ref 5–15)
BUN: 12 mg/dL (ref 6–20)
CO2: 25 mmol/L (ref 22–32)
Calcium: 9.6 mg/dL (ref 8.9–10.3)
Chloride: 105 mmol/L (ref 98–111)
Creatinine, Ser: 0.89 mg/dL (ref 0.44–1.00)
GFR, Estimated: >60 mL/min (ref >60)
Glucose, Bld: 97 mg/dL (ref 70–99)
Potassium: 3.4 mmol/L — ABNORMAL LOW (ref 3.5–5.1)
Sodium: 139 mmol/L (ref 135–145)
Total Bilirubin: 0.2 mg/dL — ABNORMAL LOW (ref 0.3–1.2)
Total Protein: 7.2 g/dL (ref 6.5–8.1)

## 2020-07-20 LAB — RESP PANEL BY RT-PCR (FLU A&B, COVID) ARPGX2
Influenza A by PCR: NEGATIVE
Influenza B by PCR: NEGATIVE
SARS Coronavirus 2 by RT PCR: NEGATIVE

## 2020-07-20 LAB — URINALYSIS, ROUTINE W REFLEX MICROSCOPIC
Bilirubin Urine: NEGATIVE
Glucose, UA: NEGATIVE mg/dL
Hgb urine dipstick: NEGATIVE
Ketones, ur: NEGATIVE mg/dL
Leukocytes,Ua: NEGATIVE
Nitrite: NEGATIVE
Protein, ur: NEGATIVE mg/dL
Specific Gravity, Urine: 1.027 (ref 1.005–1.030)
pH: 6 (ref 5.0–8.0)

## 2020-07-20 LAB — CBC
HCT: 38 % (ref 36.0–46.0)
Hemoglobin: 12 g/dL (ref 12.0–15.0)
MCH: 26.8 pg (ref 26.0–34.0)
MCHC: 31.6 g/dL (ref 30.0–36.0)
MCV: 84.8 fL (ref 80.0–100.0)
Platelets: 343 10*3/uL (ref 150–400)
RBC: 4.48 MIL/uL (ref 3.87–5.11)
RDW: 13.9 % (ref 11.5–15.5)
WBC: 6.1 10*3/uL (ref 4.0–10.5)
nRBC: 0 % (ref 0.0–0.2)

## 2020-07-20 LAB — I-STAT BETA HCG BLOOD, ED (MC, WL, AP ONLY): I-stat hCG, quantitative: <5 m[IU]/mL (ref <5)

## 2020-07-20 LAB — WET PREP, GENITAL
Clue Cells Wet Prep HPF POC: NONE SEEN
Sperm: NONE SEEN
Trich, Wet Prep: NONE SEEN
Yeast Wet Prep HPF POC: NONE SEEN

## 2020-07-20 LAB — LIPASE, BLOOD: Lipase: 51 U/L (ref 11–51)

## 2020-07-20 MED ORDER — KETOROLAC TROMETHAMINE 30 MG/ML IJ SOLN: 30.0000 mg | Freq: Once | INTRAMUSCULAR | Status: DC

## 2020-07-20 MED ORDER — MORPHINE SULFATE (PF) 4 MG/ML IV SOLN
4.0000 mg | Freq: Once | INTRAVENOUS | Status: AC
  Administered 2020-07-20: 4 mg via INTRAVENOUS
  Filled 2020-07-20: qty 1

## 2020-07-20 MED ORDER — IOHEXOL 300 MG/ML  SOLN
100.0000 mL | Freq: Once | INTRAMUSCULAR | Status: AC | PRN
  Administered 2020-07-20: 100 mL via INTRAVENOUS

## 2020-07-20 MED ORDER — ONDANSETRON HCL 4 MG/2ML IJ SOLN
4.0000 mg | Freq: Once | INTRAMUSCULAR | Status: AC
  Administered 2020-07-20: 4 mg via INTRAVENOUS
  Filled 2020-07-20: qty 2

## 2020-07-20 NOTE — Discharge Instructions (Addendum)
It was a pleasure caring for you today. Good luck with your PA career! Begin taking a stool softener, such as Colace (docusate sodium), daily to help with constipation. Start with 1 tablet daily and increase as needed up to max recommended dose on box to help produce softer bowel movements.  You may also want to start taking a probiotic to maintain healthy gut flora.  Drink lots of water and increase your fiber intake. You can alternate tylenol and advil (ibuprofen) every 4 hours for pain. Schedule an appointment with your primary care provider to follow up on your recurrent abdominal pain as well as to schedule an outpatient MRI of your liver for further assessment of the abnormal findings today.

## 2020-07-20 NOTE — ED Notes (Signed)
Pt is in radiology at this time.  

## 2020-07-20 NOTE — ED Triage Notes (Signed)
Pt reports mid abd pain that radiates to the right side and nausea that started 3 days ago. Denies any abnormal vaginal bleeding or discharge.
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
## 2020-07-22 LAB — GC/CHLAMYDIA PROBE AMP (~~LOC~~) NOT AT ARMC
Chlamydia: NEGATIVE
Comment: NEGATIVE
Comment: NORMAL
Neisseria Gonorrhea: NEGATIVE

## 2020-07-30 ENCOUNTER — Other Ambulatory Visit: Payer: Self-pay | Admitting: Family Medicine

## 2020-07-30 DIAGNOSIS — K769 Liver disease, unspecified: Secondary | ICD-10-CM

## 2020-08-02 ENCOUNTER — Other Ambulatory Visit: Payer: BC Managed Care – PPO

## 2020-08-14 ENCOUNTER — Other Ambulatory Visit: Payer: BC Managed Care – PPO

## 2020-08-15 ENCOUNTER — Ambulatory Visit
Admission: RE | Admit: 2020-08-15 | Discharge: 2020-08-15 | Disposition: A | Discharge status: Home / Self Care | Payer: Self-pay | Source: Ambulatory Visit | Attending: Family Medicine | Admitting: Family Medicine

## 2020-08-15 DIAGNOSIS — K769 Liver disease, unspecified: Secondary | ICD-10-CM

## 2020-08-18 ENCOUNTER — Emergency Department (HOSPITAL_COMMUNITY): Admission: EM | Admit: 2020-08-18 | Discharge: 2020-08-18 | Discharge status: Absent without leave | Payer: Self-pay

## 2020-08-26 ENCOUNTER — Ambulatory Visit (INDEPENDENT_AMBULATORY_CARE_PROVIDER_SITE_OTHER): Payer: BC Managed Care – PPO

## 2020-08-26 ENCOUNTER — Encounter (HOSPITAL_COMMUNITY): Payer: Self-pay

## 2020-08-26 ENCOUNTER — Ambulatory Visit (HOSPITAL_COMMUNITY)
Admission: EM | Admit: 2020-08-26 | Discharge: 2020-08-26 | Disposition: A | Discharge status: Home / Self Care | Payer: BC Managed Care – PPO | Attending: Family Medicine | Admitting: Family Medicine

## 2020-08-26 ENCOUNTER — Other Ambulatory Visit: Payer: Self-pay

## 2020-08-26 DIAGNOSIS — M79642 Pain in left hand: Secondary | ICD-10-CM

## 2020-08-26 DIAGNOSIS — M25442 Effusion, left hand: Secondary | ICD-10-CM
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
## 2020-08-26 NOTE — Discharge Instructions (Addendum)
Try ice Ibuprofen or aleve for pain Return as needed See ortho if you fail to improve
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
## 2020-08-28 ENCOUNTER — Encounter (HOSPITAL_COMMUNITY): Payer: Self-pay

## 2020-08-28 ENCOUNTER — Emergency Department (HOSPITAL_COMMUNITY)
Admission: EM | Admit: 2020-08-28 | Discharge: 2020-08-28 | Disposition: A | Discharge status: Home / Self Care | Payer: BC Managed Care – PPO | Attending: Emergency Medicine | Admitting: Emergency Medicine

## 2020-08-28 DIAGNOSIS — Z91010 Allergy to peanuts: Secondary | ICD-10-CM | POA: Diagnosis not present

## 2020-08-28 DIAGNOSIS — M25532 Pain in left wrist: Secondary | ICD-10-CM

## 2020-08-28 DIAGNOSIS — J4520 Mild intermittent asthma, uncomplicated: Secondary | ICD-10-CM | POA: Diagnosis not present
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
## 2020-08-28 NOTE — Discharge Instructions (Signed)
Recommend to wear brace while working, using computer, etc to keep it straight. Scheduled motrin every 8 hours, up to 800mg  at a time. Can follow-up with hand specialist if not improving.  Call for appointment.
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
## 2020-08-30 ENCOUNTER — Other Ambulatory Visit: Payer: Self-pay

## 2020-08-30 ENCOUNTER — Ambulatory Visit (INDEPENDENT_AMBULATORY_CARE_PROVIDER_SITE_OTHER): Payer: BC Managed Care – PPO | Admitting: Family Medicine

## 2020-08-30 ENCOUNTER — Encounter: Payer: Self-pay | Admitting: Family Medicine

## 2020-08-30 DIAGNOSIS — M25532 Pain in left wrist: Secondary | ICD-10-CM

## 2020-08-30 MED ORDER — DICLOFENAC SODIUM 1 % EX GEL: 4.0000 g | Freq: Four times a day (QID) | CUTANEOUS | 6 refills | Status: DC | PRN

## 2020-08-30 MED ORDER — TRAMADOL HCL 50 MG PO TABS: 50.0000 mg | ORAL_TABLET | Freq: Four times a day (QID) | ORAL | 0 refills | Status: DC | PRN
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
## 2020-09-09 ENCOUNTER — Ambulatory Visit: Payer: BC Managed Care – PPO | Admitting: Family Medicine

## 2020-09-17 ENCOUNTER — Encounter: Payer: Self-pay | Admitting: Family Medicine

## 2020-09-17 ENCOUNTER — Ambulatory Visit (INDEPENDENT_AMBULATORY_CARE_PROVIDER_SITE_OTHER): Payer: BC Managed Care – PPO | Admitting: Family Medicine

## 2020-09-17 ENCOUNTER — Other Ambulatory Visit: Payer: Self-pay

## 2020-09-17 DIAGNOSIS — M25532 Pain in left wrist: Secondary | ICD-10-CM | POA: Diagnosis not present

## 2020-09-17 DIAGNOSIS — K769 Liver disease, unspecified: Secondary | ICD-10-CM | POA: Diagnosis not present
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
## 2020-10-05 ENCOUNTER — Ambulatory Visit
Admission: RE | Admit: 2020-10-05 | Discharge: 2020-10-05 | Disposition: A | Discharge status: Home / Self Care | Payer: BC Managed Care – PPO | Source: Ambulatory Visit | Attending: Family Medicine | Admitting: Family Medicine

## 2020-10-05 DIAGNOSIS — K769 Liver disease, unspecified: Secondary | ICD-10-CM

## 2020-10-05 MED ORDER — GADOBENATE DIMEGLUMINE 529 MG/ML IV SOLN
12.0000 mL | Freq: Once | INTRAVENOUS | Status: AC | PRN
  Administered 2020-10-05: 12 mL via INTRAVENOUS

## 2020-10-07 ENCOUNTER — Telehealth: Payer: Self-pay | Admitting: Family Medicine

## 2020-10-07 DIAGNOSIS — D1803 Hemangioma of intra-abdominal structures: Secondary | ICD-10-CM

## 2020-10-07 NOTE — Telephone Encounter (Signed)
MRI shows a probable benign hemangioma in the liver.  No worrisome findings seen.  I will request referral to gastroenterologist for reassurance.

## 2020-10-12 ENCOUNTER — Emergency Department (HOSPITAL_BASED_OUTPATIENT_CLINIC_OR_DEPARTMENT_OTHER): Payer: BC Managed Care – PPO | Admitting: Radiology

## 2020-10-12 ENCOUNTER — Other Ambulatory Visit: Payer: Self-pay

## 2020-10-12 ENCOUNTER — Emergency Department (HOSPITAL_BASED_OUTPATIENT_CLINIC_OR_DEPARTMENT_OTHER)
Admission: EM | Admit: 2020-10-12 | Discharge: 2020-10-12 | Disposition: A | Discharge status: Home / Self Care | Payer: BC Managed Care – PPO | Attending: Emergency Medicine | Admitting: Emergency Medicine

## 2020-10-12 DIAGNOSIS — J4520 Mild intermittent asthma, uncomplicated: Secondary | ICD-10-CM | POA: Insufficient documentation

## 2020-10-12 DIAGNOSIS — R110 Nausea: Secondary | ICD-10-CM | POA: Insufficient documentation

## 2020-10-12 DIAGNOSIS — R197 Diarrhea, unspecified: Secondary | ICD-10-CM | POA: Insufficient documentation

## 2020-10-12 DIAGNOSIS — R1032 Left lower quadrant pain: Secondary | ICD-10-CM

## 2020-10-12 DIAGNOSIS — Z91010 Allergy to peanuts: Secondary | ICD-10-CM | POA: Diagnosis not present

## 2020-10-12 LAB — CBC WITH DIFFERENTIAL/PLATELET
Abs Immature Granulocytes: 0.02 10*3/uL (ref 0.00–0.07)
Basophils Absolute: 0 10*3/uL (ref 0.0–0.1)
Basophils Relative: 0 %
Eosinophils Absolute: 0.3 10*3/uL (ref 0.0–0.5)
Eosinophils Relative: 3 %
HCT: 37.5 % (ref 36.0–46.0)
Hemoglobin: 11.9 g/dL — ABNORMAL LOW (ref 12.0–15.0)
Immature Granulocytes: 0 %
Lymphocytes Relative: 14 %
Lymphs Abs: 1.3 10*3/uL (ref 0.7–4.0)
MCH: 25.9 pg — ABNORMAL LOW (ref 26.0–34.0)
MCHC: 31.7 g/dL (ref 30.0–36.0)
MCV: 81.7 fL (ref 80.0–100.0)
Monocytes Absolute: 0.4 10*3/uL (ref 0.1–1.0)
Monocytes Relative: 4 %
Neutro Abs: 7.4 10*3/uL (ref 1.7–7.7)
Neutrophils Relative %: 79 %
Platelets: 336 10*3/uL (ref 150–400)
RBC: 4.59 MIL/uL (ref 3.87–5.11)
RDW: 14.4 % (ref 11.5–15.5)
WBC: 9.5 10*3/uL (ref 4.0–10.5)
nRBC: 0 % (ref 0.0–0.2)

## 2020-10-12 LAB — COMPREHENSIVE METABOLIC PANEL
ALT: 25 U/L (ref 0–44)
AST: 20 U/L (ref 15–41)
Albumin: 4.8 g/dL (ref 3.5–5.0)
Alkaline Phosphatase: 95 U/L (ref 38–126)
Anion gap: 12 (ref 5–15)
BUN: 14 mg/dL (ref 6–20)
CO2: 24 mmol/L (ref 22–32)
Calcium: 10.7 mg/dL — ABNORMAL HIGH (ref 8.9–10.3)
Chloride: 99 mmol/L (ref 98–111)
Creatinine, Ser: 0.85 mg/dL (ref 0.44–1.00)
GFR, Estimated: >60 mL/min (ref >60)
Glucose, Bld: 98 mg/dL (ref 70–99)
Potassium: 3.6 mmol/L (ref 3.5–5.1)
Sodium: 135 mmol/L (ref 135–145)
Total Bilirubin: 0.6 mg/dL (ref 0.3–1.2)
Total Protein: 7.8 g/dL (ref 6.5–8.1)

## 2020-10-12 LAB — URINALYSIS, ROUTINE W REFLEX MICROSCOPIC
Bilirubin Urine: NEGATIVE
Glucose, UA: NEGATIVE mg/dL
Ketones, ur: NEGATIVE mg/dL
Leukocytes,Ua: NEGATIVE
Nitrite: NEGATIVE
Protein, ur: NEGATIVE mg/dL
Specific Gravity, Urine: 1.022 (ref 1.005–1.030)
pH: 6.5 (ref 5.0–8.0)

## 2020-10-12 LAB — LIPASE, BLOOD: Lipase: 14 U/L (ref 11–51)

## 2020-10-12 LAB — PREGNANCY, URINE: Preg Test, Ur: NEGATIVE

## 2020-10-12 MED ORDER — OXYCODONE-ACETAMINOPHEN 5-325 MG PO TABS
2.0000 | ORAL_TABLET | Freq: Once | ORAL | Status: AC
  Administered 2020-10-12: 2 via ORAL
  Filled 2020-10-12 (×2): qty 2

## 2020-10-12 MED ORDER — ONDANSETRON HCL 4 MG/2ML IJ SOLN
4.0000 mg | Freq: Once | INTRAMUSCULAR | Status: AC
  Administered 2020-10-12: 4 mg via INTRAVENOUS
  Filled 2020-10-12: qty 2

## 2020-10-12 MED ORDER — SODIUM CHLORIDE 0.9 % IV BOLUS
1000.0000 mL | Freq: Once | INTRAVENOUS | Status: AC
  Administered 2020-10-12: 1000 mL via INTRAVENOUS

## 2020-10-12 MED ORDER — MORPHINE SULFATE (PF) 4 MG/ML IV SOLN
4.0000 mg | Freq: Once | INTRAVENOUS | Status: AC
  Administered 2020-10-12: 4 mg via INTRAVENOUS
  Filled 2020-10-12: qty 1

## 2020-10-12 NOTE — Discharge Instructions (Signed)
Follow-up with your primary care doctor regarding referral to a gastroenterologist.  Return here as needed for any worsening symptoms.

## 2020-10-12 NOTE — ED Notes (Signed)
Pt verbalizes understanding of discharge instructions. Opportunity for questioning and answers were provided. [place] removed by staff, pt discharged from ED ambulatory to home with visitor.

## 2020-10-12 NOTE — ED Triage Notes (Signed)
Pt came in c/o of lower abd pain and  left flank pain  Started last night -  Also  C/o nausea   Pt states she was dx with  Kidney stones last January
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
## 2020-10-31 ENCOUNTER — Other Ambulatory Visit: Payer: Self-pay | Admitting: Family Medicine

## 2020-10-31 DIAGNOSIS — K769 Liver disease, unspecified: Secondary | ICD-10-CM

## 2020-11-14 ENCOUNTER — Other Ambulatory Visit: Payer: Self-pay

## 2020-11-14 ENCOUNTER — Inpatient Hospital Stay (HOSPITAL_COMMUNITY)
Admission: AD | Admit: 2020-11-14 | Discharge: 2020-11-15 | Disposition: A | Discharge status: Home / Self Care | Payer: BC Managed Care – PPO | Attending: Obstetrics and Gynecology | Admitting: Obstetrics and Gynecology

## 2020-11-14 ENCOUNTER — Encounter (HOSPITAL_COMMUNITY): Payer: Self-pay | Admitting: Obstetrics and Gynecology

## 2020-11-14 DIAGNOSIS — Z3A01 Less than 8 weeks gestation of pregnancy: Secondary | ICD-10-CM | POA: Insufficient documentation

## 2020-11-14 DIAGNOSIS — Z79899 Other long term (current) drug therapy: Secondary | ICD-10-CM | POA: Insufficient documentation

## 2020-11-14 DIAGNOSIS — R109 Unspecified abdominal pain: Secondary | ICD-10-CM | POA: Insufficient documentation

## 2020-11-14 DIAGNOSIS — R110 Nausea: Secondary | ICD-10-CM | POA: Insufficient documentation

## 2020-11-14 DIAGNOSIS — O3680X0 Pregnancy with inconclusive fetal viability, not applicable or unspecified: Secondary | ICD-10-CM | POA: Insufficient documentation

## 2020-11-14 DIAGNOSIS — O26891 Other specified pregnancy related conditions, first trimester: Secondary | ICD-10-CM | POA: Insufficient documentation

## 2020-11-14 NOTE — MAU Note (Signed)
Pt stated she started having abd pain about a week ago. Went [facility] and was told she was pregnant. They took a  U?S and was unsure where pregnancy was. Though it may be ectopic but not sure. Had a repeat BHCG and it did double. Pain went away for a few days  but is back and pain is also in her lower back. Denies any vag bleeding or discharge at this time.

## 2020-11-15 ENCOUNTER — Inpatient Hospital Stay (HOSPITAL_COMMUNITY): Payer: BC Managed Care – PPO

## 2020-11-15 DIAGNOSIS — R110 Nausea: Secondary | ICD-10-CM | POA: Diagnosis not present

## 2020-11-15 DIAGNOSIS — R109 Unspecified abdominal pain: Secondary | ICD-10-CM | POA: Diagnosis not present

## 2020-11-15 DIAGNOSIS — Z79899 Other long term (current) drug therapy: Secondary | ICD-10-CM | POA: Diagnosis not present

## 2020-11-15 DIAGNOSIS — O26891 Other specified pregnancy related conditions, first trimester: Secondary | ICD-10-CM | POA: Diagnosis present

## 2020-11-15 DIAGNOSIS — O3680X0 Pregnancy with inconclusive fetal viability, not applicable or unspecified: Secondary | ICD-10-CM

## 2020-11-15 DIAGNOSIS — Z3A01 Less than 8 weeks gestation of pregnancy: Secondary | ICD-10-CM | POA: Diagnosis not present

## 2020-11-15 LAB — URINALYSIS, ROUTINE W REFLEX MICROSCOPIC
Bilirubin Urine: NEGATIVE
Glucose, UA: NEGATIVE mg/dL
Hgb urine dipstick: NEGATIVE
Ketones, ur: NEGATIVE mg/dL
Leukocytes,Ua: NEGATIVE
Nitrite: NEGATIVE
Protein, ur: NEGATIVE mg/dL
Specific Gravity, Urine: 1.019 (ref 1.005–1.030)
pH: 6 (ref 5.0–8.0)

## 2020-11-15 LAB — HCG, QUANTITATIVE, PREGNANCY: hCG, Beta Chain, Quant, S: 5473 m[IU]/mL — ABNORMAL HIGH (ref <5)
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
## 2020-11-15 NOTE — Progress Notes (Signed)
Written and verbal d/c instructions given and understanding voiced. Will return Sunday for repeat BHCG and u/s or sooner for any pregnancy concerns.

## 2020-11-17 ENCOUNTER — Inpatient Hospital Stay (HOSPITAL_COMMUNITY): Payer: BC Managed Care – PPO

## 2020-11-17 ENCOUNTER — Other Ambulatory Visit: Payer: Self-pay

## 2020-11-17 ENCOUNTER — Inpatient Hospital Stay (HOSPITAL_COMMUNITY)
Admission: AD | Admit: 2020-11-17 | Discharge: 2020-11-17 | Disposition: A | Discharge status: Home / Self Care | Payer: BC Managed Care – PPO | Attending: Obstetrics and Gynecology | Admitting: Obstetrics and Gynecology

## 2020-11-17 DIAGNOSIS — Z885 Allergy status to narcotic agent status: Secondary | ICD-10-CM | POA: Insufficient documentation

## 2020-11-17 DIAGNOSIS — O008 Other ectopic pregnancy without intrauterine pregnancy: Secondary | ICD-10-CM | POA: Diagnosis not present

## 2020-11-17 DIAGNOSIS — R109 Unspecified abdominal pain: Secondary | ICD-10-CM | POA: Diagnosis not present

## 2020-11-17 DIAGNOSIS — O26891 Other specified pregnancy related conditions, first trimester: Secondary | ICD-10-CM | POA: Insufficient documentation

## 2020-11-17 DIAGNOSIS — Z3A01 Less than 8 weeks gestation of pregnancy: Secondary | ICD-10-CM | POA: Diagnosis not present

## 2020-11-17 LAB — COMPREHENSIVE METABOLIC PANEL
ALT: 22 U/L (ref 0–44)
AST: 18 U/L (ref 15–41)
Albumin: 3.9 g/dL (ref 3.5–5.0)
Alkaline Phosphatase: 73 U/L (ref 38–126)
Anion gap: 10 (ref 5–15)
BUN: 12 mg/dL (ref 6–20)
CO2: 23 mmol/L (ref 22–32)
Calcium: 9.6 mg/dL (ref 8.9–10.3)
Chloride: 101 mmol/L (ref 98–111)
Creatinine, Ser: 0.67 mg/dL (ref 0.44–1.00)
GFR, Estimated: >60 mL/min (ref >60)
Glucose, Bld: 144 mg/dL — ABNORMAL HIGH (ref 70–99)
Potassium: 3.7 mmol/L (ref 3.5–5.1)
Sodium: 134 mmol/L — ABNORMAL LOW (ref 135–145)
Total Bilirubin: 0.7 mg/dL (ref 0.3–1.2)
Total Protein: 6.8 g/dL (ref 6.5–8.1)

## 2020-11-17 LAB — CBC
HCT: 34.1 % — ABNORMAL LOW (ref 36.0–46.0)
Hemoglobin: 10.7 g/dL — ABNORMAL LOW (ref 12.0–15.0)
MCH: 25.9 pg — ABNORMAL LOW (ref 26.0–34.0)
MCHC: 31.4 g/dL (ref 30.0–36.0)
MCV: 82.6 fL (ref 80.0–100.0)
Platelets: 315 10*3/uL (ref 150–400)
RBC: 4.13 MIL/uL (ref 3.87–5.11)
RDW: 15 % (ref 11.5–15.5)
WBC: 5.7 10*3/uL (ref 4.0–10.5)
nRBC: 0 % (ref 0.0–0.2)

## 2020-11-17 LAB — HCG, QUANTITATIVE, PREGNANCY: hCG, Beta Chain, Quant, S: 9913 m[IU]/mL — ABNORMAL HIGH (ref <5)

## 2020-11-17 MED ORDER — METHOTREXATE FOR ECTOPIC PREGNANCY
50.0000 mg/m2 | Freq: Once | INTRAMUSCULAR | Status: AC
  Administered 2020-11-17: 85 mg via INTRAMUSCULAR
  Filled 2020-11-17: qty 0

## 2020-11-17 NOTE — MAU Note (Signed)
Pt reports to mau for follow up lab work and [place].  Pt reports her pain has stopped completely and still has not had any vag bleeding.
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
## 2020-11-17 NOTE — Discharge Instructions (Signed)
 Methotrexate injection What is this medicine? METHOTREXATE (METH oh TREX ate) is a chemotherapy medicine. It treats certain types of cancer. Some of the cancers treated are breast cancer, head and neck cancer, leukemia, lymphoma, and osteosarcoma. This medicine can also be used to treat psoriasis and certain kinds of arthritis. This medicine may be used for other purposes; ask your health care provider or pharmacist if you have questions. What should I tell my health care provider before I take this medicine? They need to know if you have any of these conditions:  fluid in the stomach area or lungs  if you often drink alcohol  infection or immune system problems  kidney disease  liver disease  low blood counts (white cells, platelets, or red blood cells)  lung disease  recent or ongoing radiation  recent or upcoming vaccine  stomach ulcers  ulcerative colitis  an unusual or allergic reaction to methotrexate, other medicines, foods, dyes, or preservatives  pregnant or trying to get pregnant  breast-feeding How should I use this medicine? This medicine is for infusion into a vein or for injection into muscle or into the spinal fluid (whichever applies). It is usually given by a health care professional in a hospital or clinic setting. In rare cases, you might get this medicine at home. You will be taught how to give this medicine. Use exactly as directed. Take your medicine at regular intervals. Do not take your medicine more often than directed. If this medicine is used for arthritis or psoriasis, it should be taken weekly, NOT daily. It is important that you put your used needles and syringes in a special sharps container. Do not put them in a trash can. If you do not have a sharps container, call your pharmacist or healthcare provider to get one. Talk to your pediatrician regarding the use of this medicine in children. While this drug may be prescribed for children as  young as 2 years for selected conditions, precautions do apply. Overdosage: If you think you have taken too much of this medicine contact a poison control center or emergency room at once. NOTE: This medicine is only for you. Do not share this medicine with others. What if I miss a dose? It is important not to miss your dose. Call your doctor or health care professional if you are unable to keep an appointment. If you give yourself the medicine and you miss a dose, talk with your doctor or health care professional. Do not take double or extra doses. What may interact with this medicine? Do not take this medicine with any of the following medications:  acitretin This medicine may also interact with the following medications:  aspirin or aspirin-like medicines including salicylates  azathioprine  certain antibiotics like chloramphenicol, penicillin, tetracycline  certain medicines that treat or prevent blood clots like warfarin, apixaban, dabigatran, and rivaroxaban  certain medicines for stomach problems like esomeprazole, omeprazole, pantoprazole  cyclosporine  dapsone  diuretics  folic acid  gold  hydroxychloroquine  live virus vaccines  medicines for infection like acyclovir, adefovir, amphotericin B, bacitracin, cidofovir, foscarnet, ganciclovir, gentamicin, pentamidine, vancomycin  mercaptopurine  NSAIDs, medicines for pain and inflammation, like ibuprofen or naproxen  pamidronate  pemetrexed  penicillamine  phenylbutazone  phenytoin  probenacid  pyrimethamine  retinoids such as isotretinoin and tretinoin  steroid medicines like prednisone or cortisone  sulfonamides like sulfasalazine and trimethoprim/sulfamethoxazole  theophylline  zoledronic acid This list may not describe all possible interactions. Give your health care provider   a list of all the medicines, herbs, non-prescription drugs, or dietary supplements you use. Also tell them if you  smoke, drink alcohol, or use illegal drugs. Some items may interact with your medicine. What should I watch for while using this medicine? This medicine may make you feel generally unwell. This is not uncommon as chemotherapy can affect healthy cells as well as cancer cells. Report any side effects. Continue your course of treatment even though you feel ill unless your health care provider tells you to stop. Your condition will be monitored carefully while you are receiving this medicine. Avoid alcoholic drinks. This medicine can cause serious side effects. To reduce the risk, your health care provider may give you other medicines to take before receiving this one. Be sure to follow the directions from your health care provider. This medicine can make you more sensitive to the sun. Keep out of the sun. If you cannot avoid being in the sun, wear protective clothing and use sunscreen. Do not use sun lamps or tanning beds/booths. You may get drowsy or dizzy. Do not drive, use machinery, or do anything that needs mental alertness until you know how this medicine affects you. Do not stand or sit up quickly, especially if you are an older patient. This reduces the risk of dizzy or fainting spells. You may need blood work while you are taking this medicine. Call your doctor or health care professional for advice if you get a fever, chills or sore throat, or other symptoms of a cold or flu. Do not treat yourself. This drug decreases your body's ability to fight infections. Try to avoid being around people who are sick. This medicine may increase your risk to bruise or bleed. Call your doctor or health care professional if you notice any unusual bleeding. Be careful brushing or flossing your teeth or using a toothpick because you may get an infection or bleed more easily. If you have any dental work done, tell your dentist you are receiving this medicine Check with your doctor or health care professional if you  get an attack of severe diarrhea, nausea and vomiting, or if you sweat a lot. The loss of too much body fluid can make it dangerous for you to take this medicine. Talk to your doctor about your risk of cancer. You may be more at risk for certain types of cancers if you take this medicine. Do not become pregnant while taking this medicine or for 6 months after stopping it. Women should inform their health care provider if they wish to become pregnant or think they might be pregnant. Men should not father a child while taking this medicine and for 3 months after stopping it. There is potential for serious harm to an unborn child. Talk to your health care provider for more information. Do not breast-feed an infant while taking this medicine or for 1 week after stopping it. This medicine may make it more difficult to get pregnant or father a child. Talk to your health care provider if you are concerned about your fertility. What side effects may I notice from receiving this medicine? Side effects that you should report to your doctor or health care professional as soon as possible:  allergic reactions like skin rash, itching or hives, swelling of the face, lips, or tongue  back pain  breathing problems or shortness of breath  confusion  diarrhea  dry, nonproductive cough  low blood counts - this medicine may decrease the number of white   cells, red blood cells and platelets. You may be at increased risk of infections and bleeding  mouth sores  redness, blistering, peeling or loosening of the skin, including inside the mouth  seizures  severe headaches  signs of infection - fever or chills, cough, sore throat, pain or difficulty passing urine  signs and symptoms of bleeding such as bloody or black, tarry stools; red or dark-brown urine; spitting up blood or brown material that looks like coffee grounds; red spots on the skin; unusual bruising or bleeding from the eye, gums, or nose  signs and  symptoms of kidney injury like trouble passing urine or change in the amount of urine  signs and symptoms of liver injury like dark yellow or brown urine; general ill feeling or flu-like symptoms; light-colored stools; loss of appetite; nausea; right upper belly pain; unusually weak or tired; yellowing of the eyes or skin  stiff neck  vomiting Side effects that usually do not require medical attention (report to your doctor or health care professional if they continue or are bothersome):  dizziness  hair loss  headache  stomach pain  upset stomach This list may not describe all possible side effects. Call your doctor for medical advice about side effects. You may report side effects to FDA at 1-800-FDA-1088. Where should I keep my medicine? This medicine is given in a hospital or clinic. It will not be stored at home. NOTE: This sheet is a summary. It may not cover all possible information. If you have questions about this medicine, talk to your doctor, pharmacist, or health care provider.  2021 Elsevier/Gold Standard (2019-11-28 10:19:36)  Methotrexate Treatment for an Ectopic Pregnancy Methotrexate is a medicine that treats an ectopic pregnancy. In this type of pregnancy, the fertilized egg attaches (implants) outside the uterus. An ectopic pregnancy cannot develop into a healthy baby. Methotrexate works by stopping the growth of the fertilized egg. It also helps the body absorb tissue from the egg. This takes about 2-6 weeks. An ectopic pregnancy can be life-threatening. However, most ectopic pregnancies can be successfully treated with methotrexate if they are diagnosed early. Tell a health care provider about:  Any allergies you have.  All medicines you are taking, including vitamins, herbs, eye drops, creams, and over-the-counter medicines.  Any medical conditions you have. What are the risks? Generally, this is a safe treatment. However, problems may occur,  including:  Digestive problems. You may have: ? Nausea. ? Vomiting. ? Diarrhea. ? Cramping in your abdomen.  Bleeding or spotting from your vagina.  Feeling dizzy or light-headed.  Mouth sores.  Inflammation of the lining of your lungs (pneumonitis).  Damage to nearby structures or organs, such as damage to the liver.  Hair loss. There is a risk that methotrexate treatment will fail and the pregnancy will continue. There is also a risk that the ectopic pregnancy might tear or burst (rupture) during use of this medicine. What happens before the procedure?  Blood tests will be done to check how your disease-fighting system (immune system), liver, and kidneys are working.  You will also have blood tests to measure your pregnancy hormone levels and to find out your blood type.  You will be given a shot of a medicine called Rho(D) immune globulin if: ? You are Rh-negative and the father is Rh-positive. ? You are Rh-negative and the father's Rh type is unknown. What happens during the procedure?  Methotrexate will be injected into your muscle. ? Methotrexate may be given as a  single dose of medicine or a series of doses over time, depending on your response to the treatment. ? Methotrexate injections are given by a health care provider. Injection is the most common way that this medicine is used to treat an ectopic pregnancy.  You may also receive other medicines to manage your ectopic pregnancy. The procedure may vary among health care providers and hospitals. What can I expect after treatment? After your treatment, it is common to have:  Cramping in your abdomen.  Bleeding in your vagina.  Tiredness (fatigue).  Nausea.  Vomiting.  Diarrhea. Blood tests will be done at timed intervals for several days or weeks to check your pregnancy hormone levels. The blood tests will be done until the pregnancy hormone can no longer be found in the blood. If the methotrexate  treatment does not work, a surgical procedure may be done to remove the ectopic pregnancy. Follow these instructions at home: Medicines  Take over-the-counter and prescription medicines only as told by your health care provider.  Do not take prescription pain medicines, aspirin, ibuprofen, naproxen, or any other NSAIDs.  Do not take folic acid, prenatal vitamins, or other vitamins that contain folic acid. Activity  Do not have sex, douche, or put anything, such as tampons, in your vagina until your health care provider says it is okay.  Limit activities that take a lot of effort as told by your health care provider. General instructions  Do not drink alcohol.  Follow instructions from your health care provider about eating restrictions, such as avoiding foods that produce a lot of gas. These foods can hide the signs of a ruptured ectopic pregnancy.  Limit exposure to sunlight or artificial UV light such as from tanning beds. Methotrexate can make you more sensitive to the sun.  Follow instructions from your health care provider on how and when to report any symptoms that may indicate a ruptured ectopic pregnancy.  Keep all follow-up visits. This is important.   Contact a health care provider if:  You have persistent nausea and vomiting.  You have persistent diarrhea.  You are having a reaction to the medicine. This may include: ? Unusual fatigue. ? Skin rash. Get help right away if:  Pain in your abdomen or in the area between your hip bones (pelvic area) gets worse.  You have more bleeding from your vagina.  You feel light-headed or you faint.  You are short of breath.  Your heart rate increases.  You develop a cough.  You have chills or a fever. Summary  Methotrexate is a medicine that treats an ectopic pregnancy. This type of pregnancy forms outside the uterus.  There is a risk that methotrexate treatment will fail and the pregnancy will continue. There is also  a risk that the ectopic pregnancy might tear or burst during use of this medicine.  This medicine may be given in a single dose or a series of doses over time.  After your treatment, blood tests will be done at timed intervals for several days or weeks to check your pregnancy hormone levels. The blood tests will be done until no more pregnancy hormone is found in the blood. This information is not intended to replace advice given to you by your health care provider. Make sure you discuss any questions you have with your health care provider. Document Revised: 12/27/2019 Document Reviewed: 12/27/2019 Elsevier Patient Education  2021 [employer].

## 2020-11-18 DIAGNOSIS — O008 Other ectopic pregnancy without intrauterine pregnancy: Secondary | ICD-10-CM | POA: Insufficient documentation

## 2020-11-19 ENCOUNTER — Inpatient Hospital Stay (HOSPITAL_COMMUNITY): Payer: BC Managed Care – PPO

## 2020-11-19 ENCOUNTER — Inpatient Hospital Stay (HOSPITAL_COMMUNITY)
Admission: AD | Admit: 2020-11-19 | Discharge: 2020-11-19 | Disposition: A | Discharge status: Home / Self Care | Payer: BC Managed Care – PPO | Attending: Obstetrics and Gynecology | Admitting: Obstetrics and Gynecology

## 2020-11-19 ENCOUNTER — Encounter (HOSPITAL_COMMUNITY): Payer: Self-pay | Admitting: Obstetrics and Gynecology

## 2020-11-19 DIAGNOSIS — Z3A01 Less than 8 weeks gestation of pregnancy: Secondary | ICD-10-CM | POA: Insufficient documentation

## 2020-11-19 DIAGNOSIS — Z20822 Contact with and (suspected) exposure to covid-19: Secondary | ICD-10-CM | POA: Insufficient documentation

## 2020-11-19 DIAGNOSIS — O008 Other ectopic pregnancy without intrauterine pregnancy: Secondary | ICD-10-CM | POA: Insufficient documentation

## 2020-11-19 DIAGNOSIS — Z885 Allergy status to narcotic agent status: Secondary | ICD-10-CM | POA: Diagnosis not present

## 2020-11-19 DIAGNOSIS — R1032 Left lower quadrant pain: Secondary | ICD-10-CM | POA: Insufficient documentation

## 2020-11-19 DIAGNOSIS — O26899 Other specified pregnancy related conditions, unspecified trimester: Secondary | ICD-10-CM

## 2020-11-19 LAB — COMPREHENSIVE METABOLIC PANEL
ALT: 27 U/L (ref 0–44)
AST: 30 U/L (ref 15–41)
Albumin: 4.1 g/dL (ref 3.5–5.0)
Alkaline Phosphatase: 74 U/L (ref 38–126)
Anion gap: 13 (ref 5–15)
BUN: 13 mg/dL (ref 6–20)
CO2: 19 mmol/L — ABNORMAL LOW (ref 22–32)
Calcium: 9.5 mg/dL (ref 8.9–10.3)
Chloride: 102 mmol/L (ref 98–111)
Creatinine, Ser: 0.71 mg/dL (ref 0.44–1.00)
GFR, Estimated: >60 mL/min (ref >60)
Glucose, Bld: 109 mg/dL — ABNORMAL HIGH (ref 70–99)
Potassium: 4.2 mmol/L (ref 3.5–5.1)
Sodium: 134 mmol/L — ABNORMAL LOW (ref 135–145)
Total Bilirubin: 1 mg/dL (ref 0.3–1.2)
Total Protein: 6.6 g/dL (ref 6.5–8.1)

## 2020-11-19 LAB — CBC
HCT: 34.5 % — ABNORMAL LOW (ref 36.0–46.0)
Hemoglobin: 11.2 g/dL — ABNORMAL LOW (ref 12.0–15.0)
MCH: 26.2 pg (ref 26.0–34.0)
MCHC: 32.5 g/dL (ref 30.0–36.0)
MCV: 80.6 fL (ref 80.0–100.0)
Platelets: 362 10*3/uL (ref 150–400)
RBC: 4.28 MIL/uL (ref 3.87–5.11)
RDW: 14.9 % (ref 11.5–15.5)
WBC: 5.9 10*3/uL (ref 4.0–10.5)
nRBC: 0 % (ref 0.0–0.2)

## 2020-11-19 LAB — TYPE AND SCREEN
ABO/RH(D): A POS
Antibody Screen: NEGATIVE

## 2020-11-19 LAB — RESP PANEL BY RT-PCR (FLU A&B, COVID) ARPGX2
Influenza A by PCR: NEGATIVE
Influenza B by PCR: NEGATIVE
SARS Coronavirus 2 by RT PCR: NEGATIVE

## 2020-11-19 LAB — HCG, QUANTITATIVE, PREGNANCY: hCG, Beta Chain, Quant, S: 13417 m[IU]/mL — ABNORMAL HIGH

## 2020-11-19 MED ORDER — ONDANSETRON HCL 4 MG/2ML IJ SOLN
4.0000 mg | Freq: Once | INTRAMUSCULAR | Status: AC
  Administered 2020-11-19: 4 mg via INTRAVENOUS
  Filled 2020-11-19: qty 2

## 2020-11-19 MED ORDER — KETOROLAC TROMETHAMINE 30 MG/ML IJ SOLN
30.0000 mg | Freq: Once | INTRAMUSCULAR | Status: AC
  Administered 2020-11-19: 30 mg via INTRAVENOUS
  Filled 2020-11-19: qty 1

## 2020-11-19 MED ORDER — IBUPROFEN 600 MG PO TABS: 600.0000 mg | ORAL_TABLET | Freq: Four times a day (QID) | ORAL | 3 refills | Status: DC | PRN

## 2020-11-19 MED FILL — Methotrexate Sodium Inj 250 MG/10ML (25 MG/ML): INTRAMUSCULAR | Qty: 1 | Status: AC
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
## 2020-11-19 NOTE — Discharge Instructions (Signed)
Ectopic Pregnancy  An ectopic pregnancy happens when a fertilized egg grows outside of the womb (uterus). Fertilized means that sperm entered the egg. The egg cannot stay alive outside of the womb. What are the causes? The most common cause is damage to a fallopian tube. This damage stops the egg from getting to the womb. Instead, the egg stays in the tube. Sometimes, an ectopic pregnancy happens in other parts of the body. What increases the risk?  Getting treatment before to help you have a baby.  A past pregnancy outside of the womb.  A past surgery to have your tubes tied.  Getting pregnant while using a device in the womb to avoid getting pregnant.  Taking birth control pills before the age of 69.  Smoking or drinking alcohol.  Having a mother who took a medicine called DES many years ago. What are the signs or symptoms? Common symptoms of this condition include:  Missing a menstrual period.  Feeling like you may vomit.  Tiredness.  Breast pain.  Other signs that you are pregnant. Other symptoms may include:  Pain during sex.  Bleeding from the vagina.  Belly pain.  A fast heartbeat, low blood pressure, and sweating.  Pain or extra pressure while pooping (having a bowel movement). If your tube tears or bursts:  You may have sudden and very bad pain in your belly.  You may feel dizzy, weak, or light-headed.  You may faint.  You may have pain in your shoulder or neck. A torn or burst tube is an emergency. It can be life-threatening. How is this treated? This condition may be treated with:  Medicine. This may be given if: ? The pregnancy is found early and you are not bleeding. ? The tube has not torn or burst.  Surgery. This may be done to: ? Take out the pregnancy tissue. ? Stop bleeding. ? Take out part or all of the tube. ? Take out the womb. This is rare.  Blood tests.  Careful watching. Follow these instructions at home: Medicines  Take  over-the-counter and prescription medicines only as told by your doctor.  If told, take steps to prevent problems with pooping (constipation). You may need to: ? Drink enough fluid to keep your pee (urine) pale yellow. ? Take medicines. You will be told what medicines to take. ? Eat foods that are high in fiber. These include beans, whole grains, and fresh fruits and vegetables. ? Limit foods that are high in fat and sugar. These include fried or sweet foods.  Ask your doctor if you should avoid driving or using machines while you are taking your medicine. General instructions  Rest or limit your activities, if told to do this.  Do not have sex for 6 weeks or as told by your doctor.  Do not put tampons, vaginal cleaning wash (douche), or other things in your vagina. Do not use these things for 6 weeks or until your doctor says it is safe to use them.  Do not lift anything that is heavier than 10 lb (4.5 kg), or the limit that you are told.  Return to your normal activities when your doctor says that it is safe.  Keep all follow-up visits. Contact a doctor if:  You have a fever or chills.  You feel like you may vomit and you vomit. Get help right away if:  Your pain gets worse or is not helped by medicine.  You feel dizzy or weak.  You feel  light-headed.  You faint.  You have sudden and very bad pain in your belly.  You have very bad pain in your shoulder or neck. Summary  An ectopic pregnancy happens when a fertilized egg grows outside the womb.  This is an emergency.  The most common cause is damage to one of the fallopian tubes.  This condition may be treated with medicine, surgery, blood tests, or careful watching. This information is not intended to replace advice given to you by your health care provider. Make sure you discuss any questions you have with your health care provider. Document Revised: 11/03/2019 Document Reviewed: 10/24/2019 Elsevier Patient  Education  [place].

## 2020-11-19 NOTE — MAU Note (Signed)
Reports to MAU via EMS. Patient states she felt a pop and burning sensation on her left lower abdomen.

## 2020-11-20 ENCOUNTER — Other Ambulatory Visit: Payer: Self-pay

## 2020-11-20 ENCOUNTER — Inpatient Hospital Stay (HOSPITAL_COMMUNITY)
Admission: AD | Admit: 2020-11-20 | Discharge: 2020-11-20 | Disposition: A | Discharge status: Home / Self Care | Payer: BC Managed Care – PPO | Attending: Obstetrics and Gynecology | Admitting: Obstetrics and Gynecology

## 2020-11-20 DIAGNOSIS — Z3A01 Less than 8 weeks gestation of pregnancy: Secondary | ICD-10-CM | POA: Insufficient documentation

## 2020-11-20 DIAGNOSIS — O008 Other ectopic pregnancy without intrauterine pregnancy: Secondary | ICD-10-CM | POA: Insufficient documentation

## 2020-11-20 LAB — HCG, QUANTITATIVE, PREGNANCY: hCG, Beta Chain, Quant, S: 15966 m[IU]/mL — ABNORMAL HIGH (ref <5)

## 2020-11-20 NOTE — MAU Provider Note (Signed)
Subjective:  Alisha Reynolds is a 25 y.o. G2P0010 at [redacted]w[redacted]d who presents today for FU BHCG. She was seen on 4/24 and received MTX for angular pregnancy. She was stable at the time of DC. She was seen yesterday in MAU for worsening pain. She has no bleeding. She had an [place] yesterday that showed no worsening symptoms. Today her pain has improved. She has been using heat and tylenol and it has improved.   She denies vaginal bleeding. She denies abdominal or pelvic pain.  She saw [facility] this morning in the office.   Objective:  Physical Exam  Nursing note and vitals reviewed. Constitutional: She is oriented to person, place, and time. She appears well-developed and well-nourished. No distress.  HENT:  Head: Normocephalic.  Cardiovascular: Normal rate.  Respiratory: Effort normal.  GI: Soft. There is no tenderness.  Neurological: She is alert and oriented to person, place, and time. Skin: Skin is warm and dry.  Psychiatric: She has a normal mood and affect.   Results for orders placed or performed during the hospital encounter of 11/20/20 (from the past 24 hour(s))  hCG, quantitative, pregnancy     Status: Abnormal   Collection Time: 11/20/20  1:20 PM  Result Value Ref Range   hCG, Beta Chain, Quant, S 15,966 (H) <5 mIU/mL   Quant Day 1 4/24: 2,993 Quant Day 4 4/24: 15,966 US done yesterday shows stable. Reviewed patient with Dr. Ilda Basset. She is without pain or bleeding now. I asked Dr. Ilda Basset to review patient with Dr. Mancel Bale who is on call for [facility] for plan of care going forward. Per Dr. Huston Foley who consulted with Dr. Mancel Bale the patient can be discharged home with f/u on Saturday. She should be NPO past MN on Friday. Strict return precautions reviewed with patient.   Assessment/Plan:   1. Angular pregnancy  -Discharge home -Tylenol for pain, no ibuprofen -Strict return precautions -Return Saturday for Hcg level. NPO past MN. -All questions answered.   Lezlie Lye,  NP 11/20/2020 4:04 PM

## 2020-11-20 NOTE — MAU Note (Signed)
Presents for f/u HCG level.  Denies pain or VB.

## 2020-11-21 ENCOUNTER — Other Ambulatory Visit: Payer: BC Managed Care – PPO

## 2020-11-23 ENCOUNTER — Encounter (HOSPITAL_COMMUNITY): Payer: Self-pay | Admitting: Obstetrics and Gynecology

## 2020-11-23 ENCOUNTER — Other Ambulatory Visit: Payer: Self-pay

## 2020-11-23 ENCOUNTER — Inpatient Hospital Stay (HOSPITAL_COMMUNITY)
Admission: AD | Admit: 2020-11-23 | Discharge: 2020-11-23 | Disposition: A | Discharge status: Home / Self Care | Payer: BC Managed Care – PPO | Attending: Obstetrics and Gynecology | Admitting: Obstetrics and Gynecology

## 2020-11-23 DIAGNOSIS — O008 Other ectopic pregnancy without intrauterine pregnancy: Secondary | ICD-10-CM

## 2020-11-23 DIAGNOSIS — Z3A01 Less than 8 weeks gestation of pregnancy: Secondary | ICD-10-CM | POA: Diagnosis not present

## 2020-11-23 LAB — CBC
HCT: 33.3 % — ABNORMAL LOW (ref 36.0–46.0)
Hemoglobin: 10.4 g/dL — ABNORMAL LOW (ref 12.0–15.0)
MCH: 25.9 pg — ABNORMAL LOW (ref 26.0–34.0)
MCHC: 31.2 g/dL (ref 30.0–36.0)
MCV: 83 fL (ref 80.0–100.0)
Platelets: 311 10*3/uL (ref 150–400)
RBC: 4.01 MIL/uL (ref 3.87–5.11)
RDW: 15.4 % (ref 11.5–15.5)
WBC: 6 10*3/uL (ref 4.0–10.5)
nRBC: 0 % (ref 0.0–0.2)

## 2020-11-23 LAB — COMPREHENSIVE METABOLIC PANEL
ALT: 23 U/L (ref 0–44)
AST: 21 U/L (ref 15–41)
Albumin: 3.9 g/dL (ref 3.5–5.0)
Alkaline Phosphatase: 71 U/L (ref 38–126)
Anion gap: 6 (ref 5–15)
BUN: 13 mg/dL (ref 6–20)
CO2: 25 mmol/L (ref 22–32)
Calcium: 9.2 mg/dL (ref 8.9–10.3)
Chloride: 106 mmol/L (ref 98–111)
Creatinine, Ser: 0.68 mg/dL (ref 0.44–1.00)
GFR, Estimated: >60 mL/min (ref >60)
Glucose, Bld: 108 mg/dL — ABNORMAL HIGH (ref 70–99)
Potassium: 4.3 mmol/L (ref 3.5–5.1)
Sodium: 137 mmol/L (ref 135–145)
Total Bilirubin: 0.6 mg/dL (ref 0.3–1.2)
Total Protein: 7.1 g/dL (ref 6.5–8.1)

## 2020-11-23 LAB — HCG, QUANTITATIVE, PREGNANCY: hCG, Beta Chain, Quant, S: 17014 m[IU]/mL — ABNORMAL HIGH (ref <5)

## 2020-11-23 MED ORDER — METHOTREXATE SODIUM CHEMO INJECTION 50 MG/2ML
50.0000 mg/m2 | Freq: Once | INTRAMUSCULAR | Status: AC
  Administered 2020-11-23: 85 mg via INTRAMUSCULAR
  Filled 2020-11-23: qty 3.4

## 2020-11-23 MED ORDER — METHOTREXATE FOR ECTOPIC PREGNANCY: 50.0000 mg/m2 | Freq: Once | INTRAMUSCULAR | Status: DC

## 2020-11-23 NOTE — Discharge Instructions (Signed)
Return to care   If you have heavier bleeding that soaks through more that 2 pads per hour for an hour or more  If you bleed so much that you feel like you might pass out or you do pass out  If you have significant abdominal pain that is not improved with Tylenol        Methotrexate Treatment for an Ectopic Pregnancy Methotrexate is a medicine that treats an ectopic pregnancy. In this type of pregnancy, the fertilized egg attaches (implants) outside the uterus. An ectopic pregnancy cannot develop into a healthy baby. Methotrexate works by stopping the growth of the fertilized egg. It also helps the body absorb tissue from the egg. This takes about 2-6 weeks. An ectopic pregnancy can be life-threatening. However, most ectopic pregnancies can be successfully treated with methotrexate if they are diagnosed early. Tell a health care provider about:  Any allergies you have.  All medicines you are taking, including vitamins, herbs, eye drops, creams, and over-the-counter medicines.  Any medical conditions you have. What are the risks? Generally, this is a safe treatment. However, problems may occur, including:  Digestive problems. You may have: ? Nausea. ? Vomiting. ? Diarrhea. ? Cramping in your abdomen.  Bleeding or spotting from your vagina.  Feeling dizzy or light-headed.  Mouth sores.  Inflammation of the lining of your lungs (pneumonitis).  Damage to nearby structures or organs, such as damage to the liver.  Hair loss. There is a risk that methotrexate treatment will fail and the pregnancy will continue. There is also a risk that the ectopic pregnancy might tear or burst (rupture) during use of this medicine. What happens before the procedure?  Blood tests will be done to check how your disease-fighting system (immune system), liver, and kidneys are working.  You will also have blood tests to measure your pregnancy hormone levels and to find out your blood type.  You  will be given a shot of a medicine called Rho(D) immune globulin if: ? You are Rh-negative and the father is Rh-positive. ? You are Rh-negative and the father's Rh type is unknown. What happens during the procedure?  Methotrexate will be injected into your muscle. ? Methotrexate may be given as a single dose of medicine or a series of doses over time, depending on your response to the treatment. ? Methotrexate injections are given by a health care provider. Injection is the most common way that this medicine is used to treat an ectopic pregnancy.  You may also receive other medicines to manage your ectopic pregnancy. The procedure may vary among health care providers and hospitals. What can I expect after treatment? After your treatment, it is common to have:  Cramping in your abdomen.  Bleeding in your vagina.  Tiredness (fatigue).  Nausea.  Vomiting.  Diarrhea. Blood tests will be done at timed intervals for several days or weeks to check your pregnancy hormone levels. The blood tests will be done until the pregnancy hormone can no longer be found in the blood. If the methotrexate treatment does not work, a surgical procedure may be done to remove the ectopic pregnancy. Follow these instructions at home: Medicines  Take over-the-counter and prescription medicines only as told by your health care provider.  Do not take prescription pain medicines, aspirin, ibuprofen, naproxen, or any other NSAIDs.  Do not take folic acid, prenatal vitamins, or other vitamins that contain folic acid. Activity  Do not have sex, douche, or put anything, such as tampons, in  your vagina until your health care provider says it is okay.  Limit activities that take a lot of effort as told by your health care provider. General instructions  Do not drink alcohol.  Follow instructions from your health care provider about eating restrictions, such as avoiding foods that produce a lot of gas. These  foods can hide the signs of a ruptured ectopic pregnancy.  Limit exposure to sunlight or artificial UV light such as from tanning beds. Methotrexate can make you more sensitive to the sun.  Follow instructions from your health care provider on how and when to report any symptoms that may indicate a ruptured ectopic pregnancy.  Keep all follow-up visits. This is important.   Contact a health care provider if:  You have persistent nausea and vomiting.  You have persistent diarrhea.  You are having a reaction to the medicine. This may include: ? Unusual fatigue. ? Skin rash. Get help right away if:  Pain in your abdomen or in the area between your hip bones (pelvic area) gets worse.  You have more bleeding from your vagina.  You feel light-headed or you faint.  You are short of breath.  Your heart rate increases.  You develop a cough.  You have chills or a fever. Summary  Methotrexate is a medicine that treats an ectopic pregnancy. This type of pregnancy forms outside the uterus.  There is a risk that methotrexate treatment will fail and the pregnancy will continue. There is also a risk that the ectopic pregnancy might tear or burst during use of this medicine.  This medicine may be given in a single dose or a series of doses over time.  After your treatment, blood tests will be done at timed intervals for several days or weeks to check your pregnancy hormone levels. The blood tests will be done until no more pregnancy hormone is found in the blood. This information is not intended to replace advice given to you by your health care provider. Make sure you discuss any questions you have with your health care provider. Document Revised: 12/27/2019 Document Reviewed: 12/27/2019 Elsevier Patient Education  2021 [employer].

## 2020-11-23 NOTE — MAU Note (Signed)
Pt reports to mau for follow up lab work after receiving MTX last Sunday night.  Pt denies any pain or bleeding today.  Reports she has been NPO since before midnight

## 2020-11-23 NOTE — MAU Provider Note (Signed)
History   Chief Complaint:  Follow-up   Alisha Reynolds is  25 y.o. G2P0010 Patient's last menstrual period was 10/09/2020.Marland Kitchen Patient is here for follow up of quantitative HCG and ongoing surveillance of pregnancy status. She is [redacted]w[redacted]d weeks gestation  by LMP.   She was given methotrexate on 4/24 for angular pregnancy.   Since her last visit, the patient is without new complaint. The patient reports bleeding as  none now.  She denies any pain.  General ROS:  negative  Her previous Quantitative HCG values are: Component     Latest Ref Rng & Units 11/17/2020 11/19/2020 11/20/2020  HCG, Beta Chain, Quant, S     <5 mIU/mL 9,913 (H) MTX given 13,417 (H) 15,966 (H) Day 4      Physical Exam   Blood pressure 128/64, pulse 91, temperature 98.4 F (36.9 C), temperature source Oral, resp. rate 16, height 5\' 1"  (1.549 m), weight 68.3 kg, last menstrual period 10/09/2020, SpO2 98 %.  Physical Examination: General appearance - alert, well appearing, and in no distress Mental status - normal mood, behavior, speech, dress, motor activity, and thought processes Eyes - sclera anicteric Chest - normal respiratory effort Abdomen - soft, nontender, nondistended, no masses or organomegaly Skin - warm & dry  Labs: Results for orders placed or performed during the hospital encounter of 11/23/20 (from the past 24 hour(s))  hCG, quantitative, pregnancy   Collection Time: 11/23/20  8:17 AM  Result Value Ref Range   hCG, Beta Chain, Quant, S 17,014 (H) <5 mIU/mL  Comprehensive metabolic panel   Collection Time: 11/23/20  8:17 AM  Result Value Ref Range   Sodium 137 135 - 145 mmol/L   Potassium 4.3 3.5 - 5.1 mmol/L   Chloride 106 98 - 111 mmol/L   CO2 25 22 - 32 mmol/L   Glucose, Bld 108 (H) 70 - 99 mg/dL   BUN 13 6 - 20 mg/dL   Creatinine, Ser 0.68 0.44 - 1.00 mg/dL   Calcium 9.2 8.9 - 10.3 mg/dL   Total Protein 7.1 6.5 - 8.1 g/dL   Albumin 3.9 3.5 - 5.0 g/dL   AST 21 15 - 41 U/L   ALT 23 0 - 44  U/L   Alkaline Phosphatase 71 38 - 126 U/L   Total Bilirubin 0.6 0.3 - 1.2 mg/dL   GFR, Estimated >60 >60 mL/min   Anion gap 6 5 - 15  CBC   Collection Time: 11/23/20  8:17 AM  Result Value Ref Range   WBC 6.0 4.0 - 10.5 K/uL   RBC 4.01 3.87 - 5.11 MIL/uL   Hemoglobin 10.4 (L) 12.0 - 15.0 g/dL   HCT 33.3 (L) 36.0 - 46.0 %   MCV 83.0 80.0 - 100.0 fL   MCH 25.9 (L) 26.0 - 34.0 pg   MCHC 31.2 30.0 - 36.0 g/dL   RDW 15.4 11.5 - 15.5 %   Platelets 311 150 - 400 K/uL   nRBC 0.0 0.0 - 0.2 %     Assessment:   1. Angular pregnancy   2. [redacted] weeks gestation of pregnancy    -slight increase in HCG. Patient is stable, asymptomatic, and has benign abdominal exam. Per Dr. Mancel Bale, patient to be given 2nd dose of methotrexate today & will have f/u labs in MAU.  -Dr. Rip Harbour notified of plan & is agreeable  The risks of methotrexate were reviewed including failure requiring repeat dosing or eventual surgery. She understands that methotrexate involves frequent return visits to  monitor lab values and that she remains at risk of ectopic rupture until her beta is less than assay. ?The patient opts to proceed with methotrexate.  She has no history of hepatic or renal dysfunction, has normal BUN/Cr/LFT's/platelets.  She is felt to be reliable for follow-up. Side effects of photosensitivity & GI upset were discussed.  She knows to avoid direct sunlight and abstain from alcohol, aspirin and aspirin-like products for two weeks. She was counseled to discontinue any MVI with folic acid. ?She understands to follow up on D4 (Tuesday) and D7 (Friday) for repeat BHCG and was given the instruction sheet. Strict ectopic precautions were reviewed, the patient knows to call with any abdominal pain, vomiting, fainting, or any concerns with her health.  Rh+, no Rhogam necessary    Plan: -Discharge home in stable condition -Ectopic precautions discussed -Patient advised to follow-up in MAU on Tuesday for day 4  hcg -Patient may return to MAU as needed or if her condition were to change or worsen  Jorje Guild, NP 11/23/2020, 1:15 PM

## 2020-11-26 ENCOUNTER — Inpatient Hospital Stay (HOSPITAL_COMMUNITY): Payer: BC Managed Care – PPO | Admitting: Anesthesiology

## 2020-11-26 ENCOUNTER — Inpatient Hospital Stay (HOSPITAL_COMMUNITY): Payer: BC Managed Care – PPO

## 2020-11-26 ENCOUNTER — Other Ambulatory Visit: Payer: Self-pay

## 2020-11-26 ENCOUNTER — Encounter (HOSPITAL_COMMUNITY)
Admission: AD | Disposition: A | Discharge status: Home / Self Care | Payer: Self-pay | Source: Home / Self Care | Attending: Obstetrics and Gynecology

## 2020-11-26 ENCOUNTER — Inpatient Hospital Stay (HOSPITAL_COMMUNITY)
Admission: AD | Admit: 2020-11-26 | Discharge: 2020-11-26 | Disposition: A | Discharge status: Home / Self Care | Payer: BC Managed Care – PPO | Attending: Obstetrics and Gynecology | Admitting: Obstetrics and Gynecology

## 2020-11-26 DIAGNOSIS — O009 Unspecified ectopic pregnancy without intrauterine pregnancy: Secondary | ICD-10-CM | POA: Diagnosis present

## 2020-11-26 DIAGNOSIS — Z888 Allergy status to other drugs, medicaments and biological substances status: Secondary | ICD-10-CM | POA: Insufficient documentation

## 2020-11-26 DIAGNOSIS — O008 Other ectopic pregnancy without intrauterine pregnancy: Secondary | ICD-10-CM

## 2020-11-26 DIAGNOSIS — Z3A01 Less than 8 weeks gestation of pregnancy: Secondary | ICD-10-CM | POA: Diagnosis not present

## 2020-11-26 DIAGNOSIS — Z885 Allergy status to narcotic agent status: Secondary | ICD-10-CM | POA: Diagnosis not present

## 2020-11-26 DIAGNOSIS — O26891 Other specified pregnancy related conditions, first trimester: Secondary | ICD-10-CM | POA: Insufficient documentation

## 2020-11-26 DIAGNOSIS — Z20822 Contact with and (suspected) exposure to covid-19: Secondary | ICD-10-CM | POA: Diagnosis not present

## 2020-11-26 DIAGNOSIS — N801 Endometriosis of ovary: Secondary | ICD-10-CM | POA: Diagnosis not present

## 2020-11-26 DIAGNOSIS — O411010 Infection of amniotic sac and membranes, unspecified, first trimester, not applicable or unspecified: Secondary | ICD-10-CM | POA: Diagnosis not present

## 2020-11-26 HISTORY — PX: DILATION AND EVACUATION: SHX1459

## 2020-11-26 HISTORY — PX: LAPAROSCOPY: SHX197

## 2020-11-26 LAB — COMPREHENSIVE METABOLIC PANEL
ALT: 44 U/L (ref 0–44)
AST: 26 U/L (ref 15–41)
Albumin: 3.9 g/dL (ref 3.5–5.0)
Alkaline Phosphatase: 70 U/L (ref 38–126)
Anion gap: 7 (ref 5–15)
BUN: 10 mg/dL (ref 6–20)
CO2: 26 mmol/L (ref 22–32)
Calcium: 9.3 mg/dL (ref 8.9–10.3)
Chloride: 102 mmol/L (ref 98–111)
Creatinine, Ser: 0.68 mg/dL (ref 0.44–1.00)
GFR, Estimated: >60 mL/min (ref >60)
Glucose, Bld: 76 mg/dL (ref 70–99)
Potassium: 3.4 mmol/L — ABNORMAL LOW (ref 3.5–5.1)
Sodium: 135 mmol/L (ref 135–145)
Total Bilirubin: 0.6 mg/dL (ref 0.3–1.2)
Total Protein: 6.9 g/dL (ref 6.5–8.1)

## 2020-11-26 LAB — HCG, QUANTITATIVE, PREGNANCY: hCG, Beta Chain, Quant, S: 14794 m[IU]/mL — ABNORMAL HIGH (ref <5)

## 2020-11-26 LAB — CBC
HCT: 33 % — ABNORMAL LOW (ref 36.0–46.0)
Hemoglobin: 10.6 g/dL — ABNORMAL LOW (ref 12.0–15.0)
MCH: 26.4 pg (ref 26.0–34.0)
MCHC: 32.1 g/dL (ref 30.0–36.0)
MCV: 82.3 fL (ref 80.0–100.0)
Platelets: 320 10*3/uL (ref 150–400)
RBC: 4.01 MIL/uL (ref 3.87–5.11)
RDW: 15.2 % (ref 11.5–15.5)
WBC: 6.7 10*3/uL (ref 4.0–10.5)
nRBC: 0 % (ref 0.0–0.2)

## 2020-11-26 LAB — RESP PANEL BY RT-PCR (FLU A&B, COVID) ARPGX2
Influenza A by PCR: NEGATIVE
Influenza B by PCR: NEGATIVE
SARS Coronavirus 2 by RT PCR: NEGATIVE

## 2020-11-26 LAB — TYPE AND SCREEN
ABO/RH(D): A POS
Antibody Screen: NEGATIVE

## 2020-11-26 SURGERY — LAPAROSCOPY, DIAGNOSTIC
Anesthesia: General

## 2020-11-26 MED ORDER — LIDOCAINE 2% (20 MG/ML) 5 ML SYRINGE
INTRAMUSCULAR | Status: AC
  Filled 2020-11-26: qty 10

## 2020-11-26 MED ORDER — FENTANYL CITRATE (PF) 100 MCG/2ML IJ SOLN
INTRAMUSCULAR | Status: AC
  Filled 2020-11-26: qty 2

## 2020-11-26 MED ORDER — SUGAMMADEX SODIUM 200 MG/2ML IV SOLN
INTRAVENOUS | Status: DC | PRN
  Administered 2020-11-26: 200 mg via INTRAVENOUS

## 2020-11-26 MED ORDER — DIPHENHYDRAMINE HCL 50 MG/ML IJ SOLN
INTRAMUSCULAR | Status: DC | PRN
  Administered 2020-11-26: 12.5 mg via INTRAVENOUS

## 2020-11-26 MED ORDER — EPHEDRINE 5 MG/ML INJ
INTRAVENOUS | Status: AC
  Filled 2020-11-26: qty 20

## 2020-11-26 MED ORDER — LACTATED RINGERS IV SOLN: INTRAVENOUS | Status: DC | PRN

## 2020-11-26 MED ORDER — CEFAZOLIN SODIUM-DEXTROSE 2-3 GM-%(50ML) IV SOLR
INTRAVENOUS | Status: DC | PRN
  Administered 2020-11-26: 2 g via INTRAVENOUS

## 2020-11-26 MED ORDER — DIPHENHYDRAMINE HCL 50 MG/ML IJ SOLN
INTRAMUSCULAR | Status: AC
  Filled 2020-11-26: qty 4

## 2020-11-26 MED ORDER — OXYCODONE HCL 5 MG PO TABS: 5.0000 mg | ORAL_TABLET | ORAL | 0 refills | Status: DC | PRN

## 2020-11-26 MED ORDER — ONDANSETRON HCL 4 MG/2ML IJ SOLN
INTRAMUSCULAR | Status: DC | PRN
  Administered 2020-11-26: 4 mg via INTRAVENOUS

## 2020-11-26 MED ORDER — OXYCODONE HCL 5 MG PO TABS
5.0000 mg | ORAL_TABLET | Freq: Once | ORAL | Status: AC
  Administered 2020-11-26: 5 mg via ORAL

## 2020-11-26 MED ORDER — LIDOCAINE HCL (CARDIAC) PF 100 MG/5ML IV SOSY
PREFILLED_SYRINGE | INTRAVENOUS | Status: DC | PRN
  Administered 2020-11-26: 60 mg via INTRATRACHEAL

## 2020-11-26 MED ORDER — ROCURONIUM BROMIDE 10 MG/ML (PF) SYRINGE
PREFILLED_SYRINGE | INTRAVENOUS | Status: AC
  Filled 2020-11-26: qty 10

## 2020-11-26 MED ORDER — DEXAMETHASONE SODIUM PHOSPHATE 10 MG/ML IJ SOLN
INTRAMUSCULAR | Status: DC | PRN
  Administered 2020-11-26: 10 mg via INTRAVENOUS

## 2020-11-26 MED ORDER — PROPOFOL 10 MG/ML IV BOLUS
INTRAVENOUS | Status: AC
  Filled 2020-11-26: qty 40

## 2020-11-26 MED ORDER — ONDANSETRON HCL 4 MG/2ML IJ SOLN
INTRAMUSCULAR | Status: AC
  Filled 2020-11-26: qty 6

## 2020-11-26 MED ORDER — BUPIVACAINE HCL (PF) 0.25 % IJ SOLN
INTRAMUSCULAR | Status: DC | PRN
  Administered 2020-11-26: 8 mL

## 2020-11-26 MED ORDER — SUCCINYLCHOLINE CHLORIDE 200 MG/10ML IV SOSY
PREFILLED_SYRINGE | INTRAVENOUS | Status: AC
  Filled 2020-11-26: qty 20

## 2020-11-26 MED ORDER — SILVER NITRATE-POT NITRATE 75-25 % EX MISC
CUTANEOUS | Status: DC | PRN
  Administered 2020-11-26: 2 via TOPICAL

## 2020-11-26 MED ORDER — SUCCINYLCHOLINE CHLORIDE 20 MG/ML IJ SOLN
INTRAMUSCULAR | Status: DC | PRN
  Administered 2020-11-26: 100 mg via INTRAVENOUS

## 2020-11-26 MED ORDER — FENTANYL CITRATE (PF) 100 MCG/2ML IJ SOLN
25.0000 ug | INTRAMUSCULAR | Status: DC | PRN
Start: 2020-11-26 — End: 2020-11-27
  Administered 2020-11-26 (×3): 50 ug via INTRAVENOUS

## 2020-11-26 MED ORDER — PROMETHAZINE HCL 25 MG/ML IJ SOLN
INTRAMUSCULAR | Status: AC
  Filled 2020-11-26: qty 1

## 2020-11-26 MED ORDER — PHENYLEPHRINE 40 MCG/ML (10ML) SYRINGE FOR IV PUSH (FOR BLOOD PRESSURE SUPPORT)
PREFILLED_SYRINGE | INTRAVENOUS | Status: AC
  Filled 2020-11-26: qty 10

## 2020-11-26 MED ORDER — FENTANYL CITRATE (PF) 250 MCG/5ML IJ SOLN
INTRAMUSCULAR | Status: AC
  Filled 2020-11-26: qty 5

## 2020-11-26 MED ORDER — SILVER NITRATE-POT NITRATE 75-25 % EX MISC
CUTANEOUS | Status: AC
  Filled 2020-11-26: qty 10

## 2020-11-26 MED ORDER — ROCURONIUM BROMIDE 100 MG/10ML IV SOLN
INTRAVENOUS | Status: DC | PRN
  Administered 2020-11-26: 40 mg via INTRAVENOUS

## 2020-11-26 MED ORDER — BUPIVACAINE HCL (PF) 0.5 % IJ SOLN
INTRAMUSCULAR | Status: AC
  Filled 2020-11-26: qty 30

## 2020-11-26 MED ORDER — FENTANYL CITRATE (PF) 250 MCG/5ML IJ SOLN
INTRAMUSCULAR | Status: DC | PRN
  Administered 2020-11-26: 100 ug via INTRAVENOUS
  Administered 2020-11-26 (×3): 50 ug via INTRAVENOUS

## 2020-11-26 MED ORDER — OXYCODONE HCL 5 MG PO TABS
ORAL_TABLET | ORAL | Status: AC
  Filled 2020-11-26: qty 1

## 2020-11-26 MED ORDER — IBUPROFEN 800 MG PO TABS
800.0000 mg | ORAL_TABLET | Freq: Three times a day (TID) | ORAL | 0 refills | Status: DC | PRN
Start: 2020-11-26 — End: 2021-01-14

## 2020-11-26 MED ORDER — MIDAZOLAM HCL 5 MG/5ML IJ SOLN
INTRAMUSCULAR | Status: DC | PRN
  Administered 2020-11-26: 2 mg via INTRAVENOUS

## 2020-11-26 MED ORDER — MIDAZOLAM HCL 2 MG/2ML IJ SOLN
INTRAMUSCULAR | Status: AC
  Filled 2020-11-26: qty 2

## 2020-11-26 MED ORDER — KETOROLAC TROMETHAMINE 15 MG/ML IJ SOLN: 15.0000 mg | Freq: Once | INTRAMUSCULAR | Status: DC

## 2020-11-26 MED ORDER — PROMETHAZINE HCL 25 MG/ML IJ SOLN
6.2500 mg | INTRAMUSCULAR | Status: AC | PRN
  Administered 2020-11-26 (×2): 6.25 mg via INTRAVENOUS

## 2020-11-26 MED ORDER — EPINEPHRINE 1 MG/10ML IJ SOSY
PREFILLED_SYRINGE | INTRAMUSCULAR | Status: AC
  Filled 2020-11-26: qty 20

## 2020-11-26 MED ORDER — PROPOFOL 10 MG/ML IV BOLUS
INTRAVENOUS | Status: DC | PRN
  Administered 2020-11-26: 200 mg via INTRAVENOUS

## 2020-11-26 SURGICAL SUPPLY — 39 items
ADH SKN CLS APL DERMABOND .7 (GAUZE/BANDAGES/DRESSINGS) ×3
BAG SPEC RTRVL LRG 6X4 10 (ENDOMECHANICALS)
CABLE HIGH FREQUENCY MONO STRZ (ELECTRODE) IMPLANT
DERMABOND ADVANCED (GAUZE/BANDAGES/DRESSINGS) ×1
DERMABOND ADVANCED .7 DNX12 (GAUZE/BANDAGES/DRESSINGS) ×3 IMPLANT
DRSG OPSITE POSTOP 3X4 (GAUZE/BANDAGES/DRESSINGS) ×4 IMPLANT
DRSG TELFA 3X8 NADH (GAUZE/BANDAGES/DRESSINGS) ×4 IMPLANT
DURAPREP 26ML APPLICATOR (WOUND CARE) ×4 IMPLANT
GLOVE SURG ENC MOIS LTX SZ7.5 (GLOVE) ×4 IMPLANT
GLOVE SURG UNDER LTX SZ7.5 (GLOVE) ×8 IMPLANT
GLOVE SURG UNDER POLY LF SZ7 (GLOVE) ×8 IMPLANT
GOWN STRL REUS W/ TWL LRG LVL3 (GOWN DISPOSABLE) ×6 IMPLANT
GOWN STRL REUS W/TWL LRG LVL3 (GOWN DISPOSABLE) ×8
HOSE CONNECTING 18IN BERKELEY (TUBING) ×4 IMPLANT
KIT BERKELEY 1ST TRIMESTER 3/8 (MISCELLANEOUS) ×4 IMPLANT
KIT TURNOVER KIT B (KITS) ×4 IMPLANT
LIGASURE VESSEL 5MM BLUNT TIP (ELECTROSURGICAL) IMPLANT
NEEDLE INSUFFLATION 14GA 120MM (NEEDLE) ×4 IMPLANT
NS IRRIG 1000ML POUR BTL (IV SOLUTION) ×4 IMPLANT
PACK LAPAROSCOPY BASIN (CUSTOM PROCEDURE TRAY) ×4 IMPLANT
PACK TRENDGUARD 450 HYBRID PRO (MISCELLANEOUS) ×3 IMPLANT
POUCH SPECIMEN RETRIEVAL 10MM (ENDOMECHANICALS) IMPLANT
PROTECTOR NERVE ULNAR (MISCELLANEOUS) ×8 IMPLANT
SCISSORS LAP 5X35 DISP (ENDOMECHANICALS) IMPLANT
SET BERKELEY SUCTION TUBING (SUCTIONS) ×4 IMPLANT
SET IRRIG TUBING LAPAROSCOPIC (IRRIGATION / IRRIGATOR) IMPLANT
SET TUBE SMOKE EVAC HIGH FLOW (TUBING) ×4 IMPLANT
SLEEVE ENDOPATH XCEL 5M (ENDOMECHANICALS) ×4 IMPLANT
SUT MNCRL AB 3-0 PS2 27 (SUTURE) ×8 IMPLANT
SUT VICRYL 0 ENDOLOOP (SUTURE) IMPLANT
SUT VICRYL 0 UR6 27IN ABS (SUTURE) ×4 IMPLANT
SYR 50ML LL SCALE MARK (SYRINGE) IMPLANT
TOWEL GREEN STERILE FF (TOWEL DISPOSABLE) ×8 IMPLANT
TRAY FOLEY W/BAG SLVR 14FR (SET/KITS/TRAYS/PACK) ×4 IMPLANT
TRENDGUARD 450 HYBRID PRO PACK (MISCELLANEOUS) ×4
TROCAR XCEL NON-BLD 11X100MML (ENDOMECHANICALS) ×4 IMPLANT
TROCAR XCEL NON-BLD 5MMX100MML (ENDOMECHANICALS) ×4 IMPLANT
VACURETTE 7MM CVD STRL WRAP (CANNULA) ×4 IMPLANT
WARMER LAPAROSCOPE (MISCELLANEOUS) ×4 IMPLANT

## 2020-11-26 NOTE — Anesthesia Procedure Notes (Signed)
Procedure Name: Intubation Date/Time: 11/26/2020 9:07 PM Performed by: Clovis Cao, CRNA Pre-anesthesia Checklist: Patient identified, Emergency Drugs available, Suction available, Patient being monitored and Timeout performed Patient Re-evaluated:Patient Re-evaluated prior to induction Oxygen Delivery Method: Circle system utilized Preoxygenation: Pre-oxygenation with 100% oxygen Induction Type: IV induction, Rapid sequence and Cricoid Pressure applied Laryngoscope Size: Miller and 2 Grade View: Grade II Tube type: Oral Tube size: 7.0 mm Number of attempts: 2 (DL x1 by CRNA with grade 3 view and no attemot to place ETT. DL x 1 by MDA with grade 2 view and easy placement of ETT. +ETCO2, = BBS. Oropharynx clear.) Airway Equipment and Method: Stylet Placement Confirmation: ETT inserted through vocal cords under direct vision,  positive ETCO2 and breath sounds checked- equal and bilateral Secured at: 21 cm Tube secured with: Tape Dental Injury: Teeth and Oropharynx as per pre-operative assessment

## 2020-11-26 NOTE — Transfer of Care (Signed)
Immediate Anesthesia Transfer of Care Note  Patient: Alisha Reynolds  Procedure(s) Performed: DIAGNOSTIC LAPAROSCOPY WITH BIOPSY OF LEFT OVARIAN LESION (Left ) DILATATION AND EVACUATION  Patient Location: PACU  Anesthesia Type:General  Level of Consciousness: drowsy  Airway & Oxygen Therapy: Patient Spontanous Breathing and Patient connected to face mask oxygen  Post-op Assessment: Report given to RN and Post -op Vital signs reviewed and stable  Post vital signs: Reviewed and stable  Last Vitals:  Vitals Value Taken Time  BP 127/59 11/26/20 2202  Temp    Pulse 114 11/26/20 2203  Resp 21 11/26/20 2203  SpO2 100 % 11/26/20 2203  Vitals shown include unvalidated device data.  Last Pain:  Vitals:   11/26/20 1145  TempSrc: Oral  PainSc:          Complications: No complications documented.

## 2020-11-26 NOTE — Op Note (Signed)
Pre Op Dx:   1. Suspected left cornual ectopic pregnancy 2. Failed medical management of ectopic pregnancy 3. Pelvic pain Post Op Dx:   1. Left angular intrauterine pregnancy 2. Failed medical management of ectopic pregnancy 3. Pelvic pain 4. Suspected endometriosis Procedure:  Diagnostic laparoscopy, Left ovarian biopsy, Dilation and suction curettage   Surgeon:  Dr. Drema Dallas Assistants:  Dr. Governor Specking Anesthesia:  General   EBL:  Minimal  IVF:  See anesthesia documentation UOP:  See anesthesia documentaion   Drains:  Foley catheter - removed at end of case Specimen removed:  Products of conception Device(s) implanted: None Case Type:  Clean-contaminated Findings:  Normal-appearing uterus, bilateral fallopian tubes, and ovaries. Uterus with clear vesicular lesions present. A left ~1cm subcentimeter polypoid lesion present on left ovary. The left cornua of the uterus appeared thin with palpation of the banjo curette in this region. No abdominopelvic adhesions. Normal-appearing liver, gallbladder, and appendix. Complications: None Indications:  25 y.o. G2P0010 with ~6 week suspected left cornual ectopic pregnancy s/p treatment with two doses of Methotrexate. Her quant HCG decreased from 17,014 to 14,794. She presented today with intermittent 7/10 pelvic pain and [place] today showed fetal heart tones. Patient opted for definitive surgical management. Description of each procedure: After informed consent the patient was taken to the operating room and placed in dorsal supine position where general endotracheal anesthesia was administered and found to be adequate.  She was placed in dorsal lithotomy position.  She was prepped and draped in the usual sterile fashion. A foley catheter was placed. A 83mm incision was made in the umbilicus and a Veress needle inserted. Pneumoperitoneum established and a 59mm trochar was placed. A 4mm right lower quadrant incision was made and a 92mm trochar  introduced under directed visualization. The port sites were injected with local anesthetic prior to procedure. The abdomen was surveyed and the above findings were noted. The left polypoid lesion from the ovary was removed bluntly. Hemostasis verified. Decision was made to perform dilation and suction curettage under visualization via laparoscopy. Consent obtained from patient's significant other, Rodman Pickle. The cervix was grasped with a single-tooth tenaculum and the cervical was serially dilated to accommodate a 80mm curved suction curette. Multiple passes were required. A banjo curette was used to curettage the endometrium until a gritty texture noted throughout. Hemostasis noted. The single-tooth tenaculum was removed and its sites were made hemostatic using silver nitrate. The pneumoperitoneum was reduced completely. The ports were removed under visualization and the port sites were closed using 4-0 Monocryl in subcuticular fashion. The patient was returned to dorsal supine position, awakened and extubated in the OR having appeared to tolerate the procedure well.  All sponge, needle, and instrument counts were correct x 2 at the end of the case.    Disposition:  PACU  Drema Dallas, DO

## 2020-11-26 NOTE — Progress Notes (Signed)
In to introduce myself to patient in preparation for surgery. Desires assessment of right fallopian tube at time of surgery to make sure it appears normal. All questions invited and answered.  Drema Dallas, DO

## 2020-11-26 NOTE — MAU Note (Signed)
Alisha Reynolds is a 25 y.o. at [redacted]w[redacted]d here in MAU reporting: here for day 4 labs post 2nd dose of MTX. Denies pain or bleeding today. States she is having soreness in her back but states it is not painful. Intermittent lower abdominal burning sensation.   Onset of complaint: ongoing  Pain score: 0/10  Vitals:   11/26/20 1145  BP: 123/87  Pulse: 94  Resp: 16  Temp: 98.3 F (36.8 C)  SpO2: 100%     Lab orders placed from triage: hcg
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
## 2020-11-26 NOTE — Progress Notes (Signed)
Pacu Discharge Note  Patient instuctions were given to Alisha Reynolds. Wound care, diet, pain, follow up care and how and whom to contact with concerns were discussed. Family aware someone needs to remain with patient overnight and concerns after receiving anesthesia and what to avoid and safety. Answered all questions and concerns.   Discussed normal vaginal bleeding and that if pt is soaking through a pad an hour she should call Surgeon. Vaginal bleeding/discharge will get better/less daily and should become more brown/red than bright red bleeding she is having now. Should be less bleeding than a normal period.   Discharge paperwork has clear contact informations for surgeon and 24 hour RN line for concerns.   Discussed what concerns to look for including infection and signs/symptoms to look for.   IV was removed prior to discharge. Patient was brought to car with belongings.   Pt exits my care.

## 2020-11-26 NOTE — Discharge Summary (Signed)
Physician Discharge Summary  Patient ID: Alisha Reynolds MRN: [id number] DOB/AGE: 1996/04/01 25 y.o.  Admit date: 11/26/2020 Discharge date: 11/26/2020  Admission Diagnoses: 1. Suspected left cornual ectopic pregnancy 2. Failed medical management of ectopic pregnancy 3. Pelvic pain Discharge Diagnoses:  Active Problems:   * No active hospital problems. * 1. Left angular intrauterine pregnancy 2. Failed medical management of ectopic pregnancy 3. Pelvic pain 4. Suspected endometriosis  Procedure(s): Diagnostic laparoscopy, Left ovarian biopsy, Dilation and Suction curettage  Discharged Condition: good  Hospital Course: Patient was admitted on 11/26/2020 for surgical management of suspected left cornual ectopic pregnancy with failed medical management with Methotrexate. There was a fetal heart rate visualized on [place] in the left angle/interstitum of the uterus. She had the procedure as named above. She met all goals prior to discharge home in stable condition. See medical chart for specific details.  Consults: Dr. Governor Specking (Reproductive Endocrinology and Infertility)  Significant Diagnostic Studies: radiology: Ultrasound: See below  FINDINGS: Intrauterine gestational sac: Visualized-single note that gestational sac is in the leftward aspect of the endometrium.  Yolk sac:  Visualized  Embryo:  Visualized  Cardiac Activity: Visualized  Heart Rate: 106 bpm  CRL:  6 mm   6 w   2 d                  [place] EDC: July 20, 2021  Subchorionic hemorrhage:  None visualized.  Maternal uterus/adnexae: Cervical os is closed. Right ovary measures 2.9 x 1.6 x 3.2 cm. Left ovary measures 4.1 x 1.3 x 1.3 cm. There is no appreciable extrauterine pelvic or adnexal mass. No free pelvic fluid.  IMPRESSION: Single live intrauterine gestation with estimated gestational age of 6+ weeks. Interval growth compared to prior study. No subchorionic hemorrhage. No extrauterine pelvic mass. Note  that the gestational sac is along the leftward aspect of the endometrium with relative thinning of the endometrium mantle, similar to prior study. Continued clinical and imaging surveillance warranted.  Treatments: surgery: As noted above  Discharge Exam: Blood pressure 123/87, pulse 94, temperature 98.3 F (36.8 C), temperature source Oral, resp. rate 16, last menstrual period 10/09/2020, SpO2 100 %.   Disposition: Discharge disposition: 01-Home or Self Care        Allergies as of 11/26/2020      Reactions   Other Anaphylaxis   All types of nuts   Peanut-containing Drug Products Anaphylaxis   All types of nuts.   Tramadol Other (See Comments)   Throat and tongue swelled up      Medication List    TAKE these medications   ibuprofen 800 MG tablet Commonly known as: ADVIL Take 1 tablet (800 mg total) by mouth every 8 (eight) hours as needed.   oxyCODONE 5 MG immediate release tablet Commonly known as: Roxicodone Take 1 tablet (5 mg total) by mouth every 4 (four) hours as needed for severe pain.       Follow-up Information    Everett Graff, MD Follow up in 1 week(s).   Specialty: Obstetrics and Gynecology Why: Please call [facility] to arrange your post-surgical follow-up. Contact information: [place] [address] [phone]               Signed: Drema Dallas 11/26/2020, 9:52 PM
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
## 2020-11-26 NOTE — Discharge Instructions (Signed)
Ectopic Pregnancy  An ectopic pregnancy happens when a fertilized egg grows outside of the womb (uterus). Fertilized means that sperm entered the egg. The egg cannot stay alive outside of the womb. What are the causes? The most common cause is damage to a fallopian tube. This damage stops the egg from getting to the womb. Instead, the egg stays in the tube. Sometimes, an ectopic pregnancy happens in other parts of the body. What increases the risk?  Getting treatment before to help you have a baby.  A past pregnancy outside of the womb.  A past surgery to have your tubes tied.  Getting pregnant while using a device in the womb to avoid getting pregnant.  Taking birth control pills before the age of 28.  Smoking or drinking alcohol.  Having a mother who took a medicine called DES many years ago. What are the signs or symptoms? Common symptoms of this condition include:  Missing a menstrual period.  Feeling like you may vomit.  Tiredness.  Breast pain.  Other signs that you are pregnant. Other symptoms may include:  Pain during sex.  Bleeding from the vagina.  Belly pain.  A fast heartbeat, low blood pressure, and sweating.  Pain or extra pressure while pooping (having a bowel movement). If your tube tears or bursts:  You may have sudden and very bad pain in your belly.  You may feel dizzy, weak, or light-headed.  You may faint.  You may have pain in your shoulder or neck. A torn or burst tube is an emergency. It can be life-threatening. How is this treated? This condition may be treated with:  Medicine. This may be given if: ? The pregnancy is found early and you are not bleeding. ? The tube has not torn or burst.  Surgery. This may be done to: ? Take out the pregnancy tissue. ? Stop bleeding. ? Take out part or all of the tube. ? Take out the womb. This is rare.  Blood tests.  Careful watching. Follow these instructions at home: Medicines  Take  over-the-counter and prescription medicines only as told by your doctor.  If told, take steps to prevent problems with pooping (constipation). You may need to: ? Drink enough fluid to keep your pee (urine) pale yellow. ? Take medicines. You will be told what medicines to take. ? Eat foods that are high in fiber. These include beans, whole grains, and fresh fruits and vegetables. ? Limit foods that are high in fat and sugar. These include fried or sweet foods.  Ask your doctor if you should avoid driving or using machines while you are taking your medicine. General instructions  Rest or limit your activities, if told to do this.  Do not have sex for 6 weeks or as told by your doctor.  Do not put tampons, vaginal cleaning wash (douche), or other things in your vagina. Do not use these things for 6 weeks or until your doctor says it is safe to use them.  Do not lift anything that is heavier than 10 lb (4.5 kg), or the limit that you are told.  Return to your normal activities when your doctor says that it is safe.  Keep all follow-up visits. Contact a doctor if:  You have a fever or chills.  You feel like you may vomit and you vomit. Get help right away if:  Your pain gets worse or is not helped by medicine.  You feel dizzy or weak.  You feel  light-headed.  You faint.  You have sudden and very bad pain in your belly.  You have very bad pain in your shoulder or neck. Summary  An ectopic pregnancy happens when a fertilized egg grows outside the womb.  This is an emergency.  The most common cause is damage to one of the fallopian tubes.  This condition may be treated with medicine, surgery, blood tests, or careful watching. This information is not intended to replace advice given to you by your health care provider. Make sure you discuss any questions you have with your health care provider. Document Revised: 11/03/2019 Document Reviewed: 10/24/2019 Elsevier Patient  Education  [place] Anesthesia, Adult, Care After This sheet gives you information about how to care for yourself after your procedure. Your health care provider may also give you more specific instructions. If you have problems or questions, contact your health care provider. What can I expect after the procedure? After the procedure, the following side effects are common: Pain or discomfort at the IV site. Nausea. Vomiting. Sore throat. Trouble concentrating. Feeling cold or chills. Feeling weak or tired. Sleepiness and fatigue. Soreness and body aches. These side effects can affect parts of the body that were not involved in surgery. Follow these instructions at home: For the time period you were told by your health care provider: Rest. Do not participate in activities where you could fall or become injured. Do not drive or use machinery. Do not drink alcohol. Do not take sleeping pills or medicines that cause drowsiness. Do not make important decisions or sign legal documents. Do not take care of children on your own.   Eating and drinking Follow any instructions from your health care provider about eating or drinking restrictions. When you feel hungry, start by eating small amounts of foods that are soft and easy to digest (bland), such as toast. Gradually return to your regular diet. Drink enough fluid to keep your urine pale yellow. If you vomit, rehydrate by drinking water, juice, or clear broth. General instructions If you have sleep apnea, surgery and certain medicines can increase your risk for breathing problems. Follow instructions from your health care provider about wearing your sleep device: Anytime you are sleeping, including during daytime naps. While taking prescription pain medicines, sleeping medicines, or medicines that make you drowsy. Have a responsible adult stay with you for the time you are told. It is important to have someone help  care for you until you are awake and alert. Return to your normal activities as told by your health care provider. Ask your health care provider what activities are safe for you. Take over-the-counter and prescription medicines only as told by your health care provider. If you smoke, do not smoke without supervision. Keep all follow-up visits as told by your health care provider. This is important. Contact a health care provider if: You have nausea or vomiting that does not get better with medicine. You cannot eat or drink without vomiting. You have pain that does not get better with medicine. You are unable to pass urine. You develop a skin rash. You have a fever. You have redness around your IV site that gets worse. Get help right away if: You have difficulty breathing. You have chest pain. You have blood in your urine or stool, or you vomit blood. Summary After the procedure, it is common to have a sore throat or nausea. It is also common to feel tired. Have a responsible adult  stay with you for the time you are told. It is important to have someone help care for you until you are awake and alert. When you feel hungry, start by eating small amounts of foods that are soft and easy to digest (bland), such as toast. Gradually return to your regular diet. Drink enough fluid to keep your urine pale yellow. Return to your normal activities as told by your health care provider. Ask your health care provider what activities are safe for you. This information is not intended to replace advice given to you by your health care provider. Make sure you discuss any questions you have with your health care provider. Document Revised: 03/28/2020 Document Reviewed: 10/26/2019 Elsevier Patient Education  2021 [employer].

## 2020-11-26 NOTE — Anesthesia Preprocedure Evaluation (Addendum)
Anesthesia Evaluation  Patient identified by MRN, date of birth, ID band Patient awake    Reviewed: Allergy & Precautions, NPO status , Patient's Chart, lab work & pertinent test results  History of Anesthesia Complications Negative for: history of anesthetic complications  Airway Mallampati: III  TM Distance: >3 FB Neck ROM: Full    Dental  (+) Dental Advisory Given, Teeth Intact   Pulmonary asthma (childhood) ,    Pulmonary exam normal        Cardiovascular negative cardio ROS Normal cardiovascular exam     Neuro/Psych  Headaches, PSYCHIATRIC DISORDERS Anxiety Depression    GI/Hepatic negative GI ROS, Neg liver ROS,   Endo/Other  negative endocrine ROS  Renal/GU negative Renal ROS     Musculoskeletal  Congenital hip dysplasia    Abdominal   Peds  Hematology  (+) anemia ,   Anesthesia Other Findings Covid test negative   Reproductive/Obstetrics (+) Pregnancy  Angular pregnancy                            Anesthesia Physical Anesthesia Plan  ASA: II  Anesthesia Plan: General   Post-op Pain Management:    Induction: Intravenous  PONV Risk Score and Plan: 4 or greater and Treatment may vary due to age or medical condition, Ondansetron, Scopolamine patch - Pre-op, Midazolam and Dexamethasone  Airway Management Planned: Oral ETT  Additional Equipment: None  Intra-op Plan:   Post-operative Plan: Extubation in OR  Informed Consent: I have reviewed the patients History and Physical, chart, labs and discussed the procedure including the risks, benefits and alternatives for the proposed anesthesia with the patient or authorized representative who has indicated his/her understanding and acceptance.     Dental advisory given  Plan Discussed with: CRNA and Anesthesiologist  Anesthesia Plan Comments:        Anesthesia Quick Evaluation

## 2020-11-27 ENCOUNTER — Encounter (HOSPITAL_COMMUNITY): Payer: Self-pay | Admitting: Obstetrics and Gynecology

## 2020-11-27 NOTE — Anesthesia Postprocedure Evaluation (Signed)
Anesthesia Post Note  Patient: Alisha Reynolds  Procedure(s) Performed: DIAGNOSTIC LAPAROSCOPY WITH BIOPSY OF LEFT OVARIAN LESION (Left ) DILATATION AND EVACUATION     Patient location during evaluation: PACU Anesthesia Type: General Level of consciousness: awake and alert Pain management: pain level controlled Vital Signs Assessment: post-procedure vital signs reviewed and stable Respiratory status: spontaneous breathing, nonlabored ventilation and respiratory function stable Cardiovascular status: blood pressure returned to baseline and stable Postop Assessment: no apparent nausea or vomiting Anesthetic complications: no   No complications documented.  Last Vitals:  Vitals:   11/26/20 2325 11/26/20 2335  BP: 120/65 119/84  Pulse: 96 98  Resp: 16 (!) 22  Temp: 36.6 C 36.7 C  SpO2: 98% 99%    Last Pain:  Vitals:   11/26/20 2335  TempSrc:   PainSc: [place]

## 2020-11-28 ENCOUNTER — Emergency Department (HOSPITAL_COMMUNITY): Payer: BC Managed Care – PPO

## 2020-11-28 ENCOUNTER — Other Ambulatory Visit: Payer: Self-pay

## 2020-11-28 ENCOUNTER — Encounter (HOSPITAL_COMMUNITY): Payer: Self-pay

## 2020-11-28 ENCOUNTER — Emergency Department (HOSPITAL_COMMUNITY)
Admission: EM | Admit: 2020-11-28 | Discharge: 2020-11-29 | Disposition: A | Discharge status: Home / Self Care | Payer: BC Managed Care – PPO | Attending: Emergency Medicine | Admitting: Emergency Medicine

## 2020-11-28 DIAGNOSIS — J4520 Mild intermittent asthma, uncomplicated: Secondary | ICD-10-CM | POA: Insufficient documentation

## 2020-11-28 DIAGNOSIS — R109 Unspecified abdominal pain: Secondary | ICD-10-CM | POA: Diagnosis not present

## 2020-11-28 DIAGNOSIS — Z91010 Allergy to peanuts: Secondary | ICD-10-CM | POA: Diagnosis not present

## 2020-11-28 DIAGNOSIS — G8918 Other acute postprocedural pain: Secondary | ICD-10-CM | POA: Diagnosis present

## 2020-11-28 LAB — CBC WITH DIFFERENTIAL/PLATELET
Abs Immature Granulocytes: 0.02 10*3/uL (ref 0.00–0.07)
Basophils Absolute: 0.1 10*3/uL (ref 0.0–0.1)
Basophils Relative: 1 %
Eosinophils Absolute: 0.4 10*3/uL (ref 0.0–0.5)
Eosinophils Relative: 5 %
HCT: 33.6 % — ABNORMAL LOW (ref 36.0–46.0)
Hemoglobin: 10.5 g/dL — ABNORMAL LOW (ref 12.0–15.0)
Immature Granulocytes: 0 %
Lymphocytes Relative: 36 %
Lymphs Abs: 2.9 10*3/uL (ref 0.7–4.0)
MCH: 26.8 pg (ref 26.0–34.0)
MCHC: 31.3 g/dL (ref 30.0–36.0)
MCV: 85.7 fL (ref 80.0–100.0)
Monocytes Absolute: 0.6 10*3/uL (ref 0.1–1.0)
Monocytes Relative: 7 %
Neutro Abs: 4 10*3/uL (ref 1.7–7.7)
Neutrophils Relative %: 51 %
Platelets: 309 10*3/uL (ref 150–400)
RBC: 3.92 MIL/uL (ref 3.87–5.11)
RDW: 16 % — ABNORMAL HIGH (ref 11.5–15.5)
WBC: 7.9 10*3/uL (ref 4.0–10.5)
nRBC: 0 % (ref 0.0–0.2)

## 2020-11-28 LAB — COMPREHENSIVE METABOLIC PANEL
ALT: 31 U/L (ref 0–44)
AST: 24 U/L (ref 15–41)
Albumin: 3.8 g/dL (ref 3.5–5.0)
Alkaline Phosphatase: 64 U/L (ref 38–126)
Anion gap: 9 (ref 5–15)
BUN: 10 mg/dL (ref 6–20)
CO2: 25 mmol/L (ref 22–32)
Calcium: 8.9 mg/dL (ref 8.9–10.3)
Chloride: 105 mmol/L (ref 98–111)
Creatinine, Ser: 0.66 mg/dL (ref 0.44–1.00)
GFR, Estimated: >60 mL/min (ref >60)
Glucose, Bld: 96 mg/dL (ref 70–99)
Potassium: 3.6 mmol/L (ref 3.5–5.1)
Sodium: 139 mmol/L (ref 135–145)
Total Bilirubin: 0.2 mg/dL — ABNORMAL LOW (ref 0.3–1.2)
Total Protein: 6.9 g/dL (ref 6.5–8.1)

## 2020-11-28 LAB — URINALYSIS, ROUTINE W REFLEX MICROSCOPIC
Bacteria, UA: NONE SEEN
Bilirubin Urine: NEGATIVE
Glucose, UA: NEGATIVE mg/dL
Ketones, ur: NEGATIVE mg/dL
Nitrite: NEGATIVE
Protein, ur: NEGATIVE mg/dL
Specific Gravity, Urine: 1.021 (ref 1.005–1.030)
pH: 6 (ref 5.0–8.0)

## 2020-11-28 LAB — LACTIC ACID, PLASMA: Lactic Acid, Venous: 1.6 mmol/L (ref 0.5–1.9)

## 2020-11-28 LAB — LIPASE, BLOOD: Lipase: 29 U/L (ref 11–51)

## 2020-11-28 LAB — SURGICAL PATHOLOGY

## 2020-11-28 MED ORDER — IOHEXOL 300 MG/ML  SOLN
100.0000 mL | Freq: Once | INTRAMUSCULAR | Status: AC | PRN
  Administered 2020-11-28: 100 mL via INTRAVENOUS

## 2020-11-28 MED ORDER — ONDANSETRON 4 MG PO TBDP
4.0000 mg | ORAL_TABLET | Freq: Once | ORAL | Status: AC
  Administered 2020-11-28: 4 mg via ORAL
  Filled 2020-11-28: qty 1

## 2020-11-28 NOTE — ED Provider Notes (Signed)
I personally evaluated the patient during the encounter and completed a history, physical, procedures, medical decision making to contribute to the overall care of the patient and decision making for the patient briefly, the patient is a 25 y.o. female is here with abdominal pain.  Patient had laparoscopic procedure yesterday for ectopic pregnancy.  Was sent home from PACU.  Patient has had fairly good abdominal pain ever since getting home.  She came back because the pain has become difficult to deal with.  She denies any fevers or chills.  She denies any heavy vaginal bleeding.  She denies any vomiting.  Patient has been nauseous.  Patient has been passing gas but not had a bowel movement.  Lab work has already been done and is overall unremarkable.  Her tenderness is.  Diffuse.  We will get a CT scan to evaluate.  Could be ileus versus postoperative condition such as perforation.  Please see PAs note for further results, evaluation, disposition of the patient.  This chart was dictated using voice recognition software.  Despite best efforts to proofread,  errors can occur which can change the documentation meaning.     EKG Interpretation None           Lennice Sites, DO 11/28/20 2306

## 2020-11-28 NOTE — ED Triage Notes (Signed)
Pt reports having laproscopic procedure completed at [facility] Tuesday night for Angular Pregnancy. Pt reports generalized abdominal pain around incision. Pt admits not taking any prescribed oxycodone since yesterday after the dose made her hallucinate.
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
## 2020-11-28 NOTE — ED Provider Notes (Signed)
Emergency Medicine Provider Triage Evaluation Note  Alisha Reynolds , a 25 y.o. female  was evaluated in triage.  Pt complains of increased abdominal pain, leakage from umbilical surgical site, neck stiffness.  Patient was [redacted] weeks pregnant with angular pregnancy, had laparoscopic procedure to remove pregnancy due to risk of uterine rupture.    Review of Systems  Positive: Abdominal pain, myalgia, chills, vaginal discharge, nausea Negative: Fever, vaginal pain, vomiting,   Physical Exam  BP (!) 121/94   Pulse 77   Temp 98.6 F (37 C)   Resp 20   Ht 5\' 1"  (1.549 m)   Wt 68 kg   LMP 10/09/2020   SpO2 98%   BMI 28.33 kg/m  Gen:   Awake, no distress   Resp:  Normal effort  MSK:   Moves extremities without difficulty  Other:  Abdomen soft, nondistended, tenderness to lower left and right quadrant  Medical Decision Making  Medically screening exam initiated at 8:30 PM.  Appropriate orders placed.  Alisha Reynolds was informed that the remainder of the evaluation will be completed by another provider, this initial triage assessment does not replace that evaluation, and the importance of remaining in the ED until their evaluation is complete.  Will be moved back for next available room.  The patient appears stable so that the remainder of the work up may be completed by another provider.       Dyann Ruddle 11/28/20 2037    Drenda Freeze, MD 11/30/20 [phone]

## 2020-11-29 MED ORDER — FENTANYL CITRATE (PF) 100 MCG/2ML IJ SOLN
50.0000 ug | Freq: Once | INTRAMUSCULAR | Status: AC
  Administered 2020-11-29: 50 ug via INTRAVENOUS
  Filled 2020-11-29: qty 2

## 2020-11-29 MED ORDER — HYDROCODONE-ACETAMINOPHEN 5-325 MG PO TABS: 1.0000 | ORAL_TABLET | Freq: Four times a day (QID) | ORAL | 0 refills | Status: DC | PRN

## 2020-11-29 NOTE — Discharge Instructions (Signed)
Take Norco for pain as needed, you can take 1 or 2 at a time.   Call you OB today for recommendation on in-office follow up.   Please return to the ED with any new or worsening symptoms.

## 2021-01-13 ENCOUNTER — Encounter (HOSPITAL_BASED_OUTPATIENT_CLINIC_OR_DEPARTMENT_OTHER): Payer: Self-pay

## 2021-01-13 ENCOUNTER — Emergency Department (HOSPITAL_BASED_OUTPATIENT_CLINIC_OR_DEPARTMENT_OTHER)
Admission: EM | Admit: 2021-01-13 | Discharge: 2021-01-14 | Disposition: A | Discharge status: Home / Self Care | Payer: BC Managed Care – PPO | Attending: Emergency Medicine | Admitting: Emergency Medicine

## 2021-01-13 ENCOUNTER — Other Ambulatory Visit: Payer: Self-pay

## 2021-01-13 DIAGNOSIS — Z91010 Allergy to peanuts: Secondary | ICD-10-CM | POA: Diagnosis not present

## 2021-01-13 DIAGNOSIS — N760 Acute vaginitis: Secondary | ICD-10-CM | POA: Insufficient documentation

## 2021-01-13 DIAGNOSIS — N898 Other specified noninflammatory disorders of vagina: Secondary | ICD-10-CM | POA: Diagnosis present

## 2021-01-13 DIAGNOSIS — J4520 Mild intermittent asthma, uncomplicated: Secondary | ICD-10-CM | POA: Insufficient documentation

## 2021-01-13 DIAGNOSIS — B9689 Other specified bacterial agents as the cause of diseases classified elsewhere: Secondary | ICD-10-CM

## 2021-01-13 LAB — URINALYSIS, ROUTINE W REFLEX MICROSCOPIC
Bilirubin Urine: NEGATIVE
Glucose, UA: NEGATIVE mg/dL
Hgb urine dipstick: NEGATIVE
Ketones, ur: NEGATIVE mg/dL
Leukocytes,Ua: NEGATIVE
Nitrite: NEGATIVE
Protein, ur: 30 mg/dL — AB
Specific Gravity, Urine: 1.031 — ABNORMAL HIGH (ref 1.005–1.030)
pH: 6 (ref 5.0–8.0)

## 2021-01-13 NOTE — ED Triage Notes (Signed)
Vaginal discharge x5 days. Patient denies odor or problems with urination.

## 2021-01-14 LAB — WET PREP, GENITAL
Sperm: NONE SEEN
Trich, Wet Prep: NONE SEEN
Yeast Wet Prep HPF POC: NONE SEEN

## 2021-01-14 LAB — PREGNANCY, URINE: Preg Test, Ur: NEGATIVE

## 2021-01-14 MED ORDER — FLUCONAZOLE 150 MG PO TABS: 150.0000 mg | ORAL_TABLET | Freq: Every day | ORAL | 0 refills | Status: DC

## 2021-01-14 MED ORDER — METRONIDAZOLE 500 MG PO TABS: 500.0000 mg | ORAL_TABLET | Freq: Two times a day (BID) | ORAL | 0 refills | Status: DC

## 2021-01-14 MED ORDER — SECNIDAZOLE 2 G PO PACK: 1.0000 | PACK | Freq: Once | ORAL | 0 refills | Status: AC
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
## 2021-01-15 LAB — GC/CHLAMYDIA PROBE AMP (~~LOC~~) NOT AT ARMC
Chlamydia: NEGATIVE
Comment: NEGATIVE
Comment: NORMAL
Neisseria Gonorrhea: NEGATIVE

## 2021-01-25 ENCOUNTER — Emergency Department (HOSPITAL_BASED_OUTPATIENT_CLINIC_OR_DEPARTMENT_OTHER): Payer: BC Managed Care – PPO

## 2021-01-25 ENCOUNTER — Emergency Department (HOSPITAL_BASED_OUTPATIENT_CLINIC_OR_DEPARTMENT_OTHER)
Admission: EM | Admit: 2021-01-25 | Discharge: 2021-01-25 | Disposition: A | Discharge status: Home / Self Care | Payer: BC Managed Care – PPO | Attending: Emergency Medicine | Admitting: Emergency Medicine

## 2021-01-25 ENCOUNTER — Other Ambulatory Visit: Payer: Self-pay

## 2021-01-25 DIAGNOSIS — J45909 Unspecified asthma, uncomplicated: Secondary | ICD-10-CM | POA: Diagnosis not present

## 2021-01-25 DIAGNOSIS — M25551 Pain in right hip: Secondary | ICD-10-CM

## 2021-01-25 DIAGNOSIS — W19XXXA Unspecified fall, initial encounter: Secondary | ICD-10-CM

## 2021-01-25 DIAGNOSIS — W109XXA Fall (on) (from) unspecified stairs and steps, initial encounter: Secondary | ICD-10-CM | POA: Diagnosis not present

## 2021-01-25 DIAGNOSIS — Z91010 Allergy to peanuts: Secondary | ICD-10-CM | POA: Insufficient documentation
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
## 2021-01-25 NOTE — Discharge Instructions (Addendum)
You were seen in the emergency department for right hip pain after a fall.  Your x-rays did not show any acute fracture or dislocation.  Please use ice to the affected area and contact your orthopedic doctor for close follow-up.  Recommended crutches and nonweightbearing until you are cleared by orthopedics.

## 2021-01-25 NOTE — ED Triage Notes (Signed)
Pt to ED from home with c/o right hip pain after pt tripped and fell down approx. 2 steps on a set of stairs. Pt states she did not strike her head or have LOC. Pt states she had surgery on her right hip on June 29th last Wednesday and felt a pop when she fell and wants her hip evaluated.

## 2021-02-24 ENCOUNTER — Other Ambulatory Visit: Payer: Self-pay

## 2021-02-24 ENCOUNTER — Inpatient Hospital Stay (HOSPITAL_COMMUNITY)
Admission: AD | Admit: 2021-02-24 | Discharge: 2021-02-24 | Disposition: A | Discharge status: Home / Self Care | Payer: BC Managed Care – PPO | Attending: Obstetrics & Gynecology | Admitting: Obstetrics & Gynecology

## 2021-02-24 DIAGNOSIS — Z789 Other specified health status: Secondary | ICD-10-CM

## 2021-02-24 DIAGNOSIS — Z3202 Encounter for pregnancy test, result negative: Secondary | ICD-10-CM | POA: Insufficient documentation

## 2021-02-24 DIAGNOSIS — N939 Abnormal uterine and vaginal bleeding, unspecified: Secondary | ICD-10-CM | POA: Diagnosis not present

## 2021-02-24 LAB — HCG, QUANTITATIVE, PREGNANCY: hCG, Beta Chain, Quant, S: 2 m[IU]/mL (ref <5)

## 2021-02-24 NOTE — MAU Provider Note (Signed)
Event Date/Time   First Provider Initiated Contact with Patient 02/24/21 1306      S Ms. Alisha Reynolds is a 25 y.o. G2P0010 patient who presents to MAU today with complaint of vaginal bleeding. She reports she had 2 positive UPTs on Saturday.  However, this morning she reports this morning she had some spotting with wiping. She denies pain. Reports Definite LMP was July 5th and normal.   O BP 135/67 (BP Location: Right Arm)   Pulse 90   Temp 98.6 F (37 C) (Oral)   Resp 16   LMP 01/28/2021   SpO2 100% Comment: room air Physical Exam Constitutional:      Appearance: Normal appearance.  HENT:     Head: Normocephalic and atraumatic.  Eyes:     Conjunctiva/sclera: Conjunctivae normal.  Cardiovascular:     Rate and Rhythm: Normal rate.  Pulmonary:     Effort: Pulmonary effort is normal.  Musculoskeletal:     Cervical back: Normal range of motion.  Skin:    General: Skin is warm and dry.  Neurological:     Mental Status: She is alert and oriented to person, place, and time.  Psychiatric:        Mood and Affect: Mood normal.        Behavior: Behavior normal.        Thought Content: Thought content normal.   Results for orders placed or performed during the hospital encounter of 02/24/21 (from the past 24 hour(s))  hCG, quantitative, pregnancy     Status: None   Collection Time: 02/24/21  1:31 PM  Result Value Ref Range   hCG, Beta Chain, Quant, S 2 <5 mIU/mL    A Medical screening exam complete Vaginal Bleeding Positive Home UPT  P Discussed obtaining hCG for verification. Patient agreeable. Given option to wait or receive call later. Patient opts to wait.   Gavin Pound, [place] 02/24/2021 1:06 PM   Reassessment (4:16 PM)  Results as above. Provider discusses with patient. Informed that possible chemical pregnancy, but unable to confirm definitively.  Instructed to not take UPTs prior to missed menses.  Encouraged continue to take PNV and folic acid as patient  desiring pregnancy. Patient and SO without further questions or concerns.  Encouraged to call or return to MAU if symptoms worsen or with the onset of new symptoms. Discharged to home in stable condition.  Maryann Conners MSN, CNM Advanced Practice Provider, [facility]

## 2021-02-24 NOTE — MAU Note (Signed)
Alisha Reynolds is a 25 y.o. here in MAU reporting: had 2 + UPT on Saturday. Today when she used the bathroom saw a little bit of bleeding when she wiped.  Onset of complaint: today  Pain score: 0/10  Vitals:   02/24/21 1301  BP: 135/67  Pulse: 90  Resp: 16  Temp: 98.6 F (37 C)  SpO2: 100%     Lab orders placed from triage: upt

## 2021-03-28 ENCOUNTER — Inpatient Hospital Stay (HOSPITAL_COMMUNITY)
Admission: AD | Admit: 2021-03-28 | Discharge: 2021-03-28 | Disposition: A | Discharge status: Home / Self Care | Payer: BC Managed Care – PPO | Attending: Obstetrics and Gynecology | Admitting: Obstetrics and Gynecology

## 2021-03-28 ENCOUNTER — Other Ambulatory Visit: Payer: Self-pay

## 2021-03-28 ENCOUNTER — Inpatient Hospital Stay (HOSPITAL_COMMUNITY): Payer: BC Managed Care – PPO

## 2021-03-28 ENCOUNTER — Encounter (HOSPITAL_COMMUNITY): Payer: Self-pay

## 2021-03-28 DIAGNOSIS — R1033 Periumbilical pain: Secondary | ICD-10-CM | POA: Insufficient documentation

## 2021-03-28 DIAGNOSIS — O3680X0 Pregnancy with inconclusive fetal viability, not applicable or unspecified: Secondary | ICD-10-CM

## 2021-03-28 DIAGNOSIS — Z885 Allergy status to narcotic agent status: Secondary | ICD-10-CM | POA: Diagnosis not present

## 2021-03-28 DIAGNOSIS — Z3A01 Less than 8 weeks gestation of pregnancy: Secondary | ICD-10-CM | POA: Diagnosis not present

## 2021-03-28 DIAGNOSIS — Z79899 Other long term (current) drug therapy: Secondary | ICD-10-CM | POA: Insufficient documentation

## 2021-03-28 DIAGNOSIS — R109 Unspecified abdominal pain: Secondary | ICD-10-CM | POA: Diagnosis not present

## 2021-03-28 DIAGNOSIS — O26891 Other specified pregnancy related conditions, first trimester: Secondary | ICD-10-CM | POA: Insufficient documentation

## 2021-03-28 LAB — COMPREHENSIVE METABOLIC PANEL
ALT: 24 U/L (ref 0–44)
AST: 32 U/L (ref 15–41)
Albumin: 4.2 g/dL (ref 3.5–5.0)
Alkaline Phosphatase: 69 U/L (ref 38–126)
Anion gap: 9 (ref 5–15)
BUN: 10 mg/dL (ref 6–20)
CO2: 24 mmol/L (ref 22–32)
Calcium: 9.6 mg/dL (ref 8.9–10.3)
Chloride: 103 mmol/L (ref 98–111)
Creatinine, Ser: 0.79 mg/dL (ref 0.44–1.00)
GFR, Estimated: >60 mL/min (ref >60)
Glucose, Bld: 90 mg/dL (ref 70–99)
Potassium: 4.1 mmol/L (ref 3.5–5.1)
Sodium: 136 mmol/L (ref 135–145)
Total Bilirubin: 0.6 mg/dL (ref 0.3–1.2)
Total Protein: 7.2 g/dL (ref 6.5–8.1)

## 2021-03-28 LAB — URINALYSIS, ROUTINE W REFLEX MICROSCOPIC
Bilirubin Urine: NEGATIVE
Glucose, UA: NEGATIVE mg/dL
Hgb urine dipstick: NEGATIVE
Ketones, ur: NEGATIVE mg/dL
Leukocytes,Ua: NEGATIVE
Nitrite: NEGATIVE
Protein, ur: NEGATIVE mg/dL
Specific Gravity, Urine: 1.02 (ref 1.005–1.030)
pH: 6 (ref 5.0–8.0)

## 2021-03-28 LAB — WET PREP, GENITAL
Clue Cells Wet Prep HPF POC: NONE SEEN
Sperm: NONE SEEN
Trich, Wet Prep: NONE SEEN
Yeast Wet Prep HPF POC: NONE SEEN

## 2021-03-28 LAB — CBC
HCT: 41 % (ref 36.0–46.0)
Hemoglobin: 13.4 g/dL (ref 12.0–15.0)
MCH: 27.8 pg (ref 26.0–34.0)
MCHC: 32.7 g/dL (ref 30.0–36.0)
MCV: 85.1 fL (ref 80.0–100.0)
Platelets: 358 10*3/uL (ref 150–400)
RBC: 4.82 MIL/uL (ref 3.87–5.11)
RDW: 14.6 % (ref 11.5–15.5)
WBC: 7.1 10*3/uL (ref 4.0–10.5)
nRBC: 0 % (ref 0.0–0.2)

## 2021-03-28 LAB — ABO/RH: ABO/RH(D): A POS

## 2021-03-28 LAB — HCG, QUANTITATIVE, PREGNANCY: hCG, Beta Chain, Quant, S: 643 m[IU]/mL — ABNORMAL HIGH (ref <5)

## 2021-03-28 LAB — POCT PREGNANCY, URINE: Preg Test, Ur: POSITIVE — AB

## 2021-03-28 NOTE — MAU Note (Signed)
Alisha Reynolds is a 25 y.o. at 48w4dhere in MAU reporting: mild cramping that radiates to her back since Monday. No bleeding or discharge.  LMP: 02/24/2021  Onset of complaint: few days  Pain score: 4/10  Vitals:   03/28/21 1551  BP: 137/81  Pulse: (!) 104  Resp: 16  Temp: 98.7 F (37.1 C)  SpO2: 100%     Lab orders placed from triage: UA, UPT

## 2021-03-28 NOTE — Discharge Instructions (Signed)
-  return to Maternity Assessment on Sunday evening, 02/27/2021 for follow up quant  -
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
## 2021-03-30 ENCOUNTER — Other Ambulatory Visit: Payer: Self-pay

## 2021-03-30 ENCOUNTER — Inpatient Hospital Stay (HOSPITAL_COMMUNITY)
Admission: AD | Admit: 2021-03-30 | Discharge: 2021-03-30 | Disposition: A | Discharge status: Home / Self Care | Payer: BC Managed Care – PPO | Attending: Obstetrics & Gynecology | Admitting: Obstetrics & Gynecology

## 2021-03-30 DIAGNOSIS — Z3A01 Less than 8 weeks gestation of pregnancy: Secondary | ICD-10-CM | POA: Insufficient documentation

## 2021-03-30 DIAGNOSIS — O3680X0 Pregnancy with inconclusive fetal viability, not applicable or unspecified: Secondary | ICD-10-CM | POA: Diagnosis not present

## 2021-03-30 LAB — HCG, QUANTITATIVE, PREGNANCY: hCG, Beta Chain, Quant, S: 2178 m[IU]/mL — ABNORMAL HIGH (ref <5)

## 2021-03-30 NOTE — MAU Provider Note (Signed)
History   Chief Complaint:  repeat beta hcg   Alisha Reynolds is  25 y.o. G2P0010 Patient's last menstrual period was 02/24/2021.Marland Kitchen Patient is here for follow up of quantitative HCG and ongoing surveillance of pregnancy status. She is 52w6dweeks gestation  by LMP.    Since her last visit, the patient is without new complaint. The patient reports bleeding as  none now.  She denies any pain.  General ROS:  negative for abdominal pain  Her previous Quantitative HCG values are:  Component     Latest Ref Rng & Units 03/28/2021  HCG, Beta Chain, Quant, S     <5 mIU/mL 643 (H)     Physical Exam   Blood pressure 126/76, pulse 94, temperature 98.6 F (37 C), temperature source Oral, resp. rate 16, last menstrual period 02/24/2021, SpO2 100 %, unknown if currently breastfeeding.  Physical Examination: General appearance - alert, well appearing, and in no distress Mental status - alert, oriented to person, place, and time Eyes - sclera anicteric Chest - normal respiratory effort  Labs: Results for orders placed or performed during the hospital encounter of 03/30/21 (from the past 24 hour(s))  hCG, quantitative, pregnancy   Collection Time: 03/30/21  6:17 PM  Result Value Ref Range   hCG, Beta Chain, Quant, S 2,178 (H) <5 mIU/mL    Ultrasound Studies:   No results found.  Assessment:   1. Pregnancy of unknown anatomic location     -hcg has risen appropriately & pt is asymptomatic. 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 [place] left message with CNM on call & instructed patient to call office  Plan: -Discharge home in stable condition -SAB vs ectopic precautions discussed -Patient advised to follow-up with [facility] on Wednesday for repeat HCG - if she can't get in touch with them she was instructed to return to MAU for repeat HCG -Patient may return to MAU as needed or if her condition  were to change or worsen  EJorje Guild NP 03/30/2021, 10:10 PM

## 2021-03-30 NOTE — MAU Note (Addendum)
..  Alisha Reynolds is a 25 y.o. at 52w6dhere in MAU reporting: repeat beta hcg. Denies bleeding and abdominal pain. Denies any other complaints.   BP: 126/76 P: 94 T: 98.6 oral R: 16 O2: 100%

## 2021-03-30 NOTE — Discharge Instructions (Signed)
Return to care  If you have heavier bleeding that soaks through more than 2 pads per hour for an hour or more If you bleed so much that you feel like you might pass out or you do pass out If you have significant abdominal pain that is not improved with Tylenol   

## 2021-04-01 LAB — GC/CHLAMYDIA PROBE AMP (~~LOC~~) NOT AT ARMC
Chlamydia: NEGATIVE
Comment: NEGATIVE
Comment: NORMAL
Neisseria Gonorrhea: NEGATIVE

## 2021-04-07 ENCOUNTER — Encounter (HOSPITAL_COMMUNITY): Payer: Self-pay | Admitting: Obstetrics and Gynecology

## 2021-04-07 ENCOUNTER — Inpatient Hospital Stay (HOSPITAL_COMMUNITY): Payer: BC Managed Care – PPO

## 2021-04-07 ENCOUNTER — Inpatient Hospital Stay (HOSPITAL_COMMUNITY)
Admission: AD | Admit: 2021-04-07 | Discharge: 2021-04-07 | Disposition: A | Discharge status: Home / Self Care | Payer: BC Managed Care – PPO | Attending: Obstetrics and Gynecology | Admitting: Obstetrics and Gynecology

## 2021-04-07 DIAGNOSIS — R102 Pelvic and perineal pain: Secondary | ICD-10-CM | POA: Diagnosis not present

## 2021-04-07 DIAGNOSIS — O209 Hemorrhage in early pregnancy, unspecified: Secondary | ICD-10-CM | POA: Diagnosis present

## 2021-04-07 DIAGNOSIS — Z3A01 Less than 8 weeks gestation of pregnancy: Secondary | ICD-10-CM | POA: Insufficient documentation

## 2021-04-07 DIAGNOSIS — Z885 Allergy status to narcotic agent status: Secondary | ICD-10-CM | POA: Diagnosis not present

## 2021-04-07 DIAGNOSIS — O26891 Other specified pregnancy related conditions, first trimester: Secondary | ICD-10-CM | POA: Insufficient documentation

## 2021-04-07 DIAGNOSIS — Z888 Allergy status to other drugs, medicaments and biological substances status: Secondary | ICD-10-CM | POA: Diagnosis not present

## 2021-04-07 DIAGNOSIS — O26851 Spotting complicating pregnancy, first trimester: Secondary | ICD-10-CM

## 2021-04-07 LAB — CBC
HCT: 38.1 % (ref 36.0–46.0)
Hemoglobin: 12.2 g/dL (ref 12.0–15.0)
MCH: 27.7 pg (ref 26.0–34.0)
MCHC: 32 g/dL (ref 30.0–36.0)
MCV: 86.6 fL (ref 80.0–100.0)
Platelets: 321 10*3/uL (ref 150–400)
RBC: 4.4 MIL/uL (ref 3.87–5.11)
RDW: 14.7 % (ref 11.5–15.5)
WBC: 7.1 10*3/uL (ref 4.0–10.5)
nRBC: 0 % (ref 0.0–0.2)

## 2021-04-07 LAB — URINALYSIS, ROUTINE W REFLEX MICROSCOPIC
Bilirubin Urine: NEGATIVE
Glucose, UA: NEGATIVE mg/dL
Hgb urine dipstick: NEGATIVE
Ketones, ur: NEGATIVE mg/dL
Leukocytes,Ua: NEGATIVE
Nitrite: NEGATIVE
Protein, ur: NEGATIVE mg/dL
Specific Gravity, Urine: 1.012 (ref 1.005–1.030)
pH: 6 (ref 5.0–8.0)

## 2021-04-07 LAB — HCG, QUANTITATIVE, PREGNANCY: hCG, Beta Chain, Quant, S: 25148 m[IU]/mL — ABNORMAL HIGH (ref <5)

## 2021-04-07 MED ORDER — ACETAMINOPHEN 500 MG PO TABS
1000.0000 mg | ORAL_TABLET | Freq: Four times a day (QID) | ORAL | Status: DC | PRN
  Administered 2021-04-07: 1000 mg via ORAL
  Filled 2021-04-07: qty 2

## 2021-04-07 NOTE — Discharge Instructions (Signed)
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
## 2021-04-07 NOTE — MAU Note (Signed)
Been having on and off cramping since last night. Also reports "sharp pains in her butt". Has noted a pinkish d/c  when she wipes.  Has not had any further blood work or [place] since last here. First appt with [facility] is 9/23.

## 2021-04-16 ENCOUNTER — Encounter (HOSPITAL_BASED_OUTPATIENT_CLINIC_OR_DEPARTMENT_OTHER): Payer: Self-pay

## 2021-04-16 ENCOUNTER — Emergency Department (HOSPITAL_BASED_OUTPATIENT_CLINIC_OR_DEPARTMENT_OTHER): Payer: BC Managed Care – PPO

## 2021-04-16 ENCOUNTER — Other Ambulatory Visit: Payer: Self-pay

## 2021-04-16 DIAGNOSIS — O219 Vomiting of pregnancy, unspecified: Secondary | ICD-10-CM | POA: Insufficient documentation

## 2021-04-16 DIAGNOSIS — R072 Precordial pain: Secondary | ICD-10-CM | POA: Diagnosis not present

## 2021-04-16 DIAGNOSIS — J4520 Mild intermittent asthma, uncomplicated: Secondary | ICD-10-CM | POA: Diagnosis not present

## 2021-04-16 DIAGNOSIS — Z3A01 Less than 8 weeks gestation of pregnancy: Secondary | ICD-10-CM | POA: Insufficient documentation

## 2021-04-16 DIAGNOSIS — Z91010 Allergy to peanuts: Secondary | ICD-10-CM | POA: Diagnosis not present

## 2021-04-16 DIAGNOSIS — O99511 Diseases of the respiratory system complicating pregnancy, first trimester: Secondary | ICD-10-CM | POA: Diagnosis not present

## 2021-04-16 LAB — BASIC METABOLIC PANEL
Anion gap: 10 (ref 5–15)
BUN: 15 mg/dL (ref 6–20)
CO2: 24 mmol/L (ref 22–32)
Calcium: 9.9 mg/dL (ref 8.9–10.3)
Chloride: 102 mmol/L (ref 98–111)
Creatinine, Ser: 0.7 mg/dL (ref 0.44–1.00)
GFR, Estimated: >60 mL/min (ref >60)
Glucose, Bld: 112 mg/dL — ABNORMAL HIGH (ref 70–99)
Potassium: 3.8 mmol/L (ref 3.5–5.1)
Sodium: 136 mmol/L (ref 135–145)

## 2021-04-16 LAB — CBC
HCT: 35.9 % — ABNORMAL LOW (ref 36.0–46.0)
Hemoglobin: 11.9 g/dL — ABNORMAL LOW (ref 12.0–15.0)
MCH: 27.9 pg (ref 26.0–34.0)
MCHC: 33.1 g/dL (ref 30.0–36.0)
MCV: 84.3 fL (ref 80.0–100.0)
Platelets: 272 10*3/uL (ref 150–400)
RBC: 4.26 MIL/uL (ref 3.87–5.11)
RDW: 14.2 % (ref 11.5–15.5)
WBC: 8.8 10*3/uL (ref 4.0–10.5)
nRBC: 0 % (ref 0.0–0.2)

## 2021-04-16 NOTE — ED Triage Notes (Signed)
Pt reports chest pain x 3 days and vomiting 5x today - states she is [redacted] wks pregnant.

## 2021-04-17 ENCOUNTER — Emergency Department (HOSPITAL_BASED_OUTPATIENT_CLINIC_OR_DEPARTMENT_OTHER): Payer: BC Managed Care – PPO

## 2021-04-17 ENCOUNTER — Emergency Department (HOSPITAL_BASED_OUTPATIENT_CLINIC_OR_DEPARTMENT_OTHER)
Admission: EM | Admit: 2021-04-17 | Discharge: 2021-04-17 | Disposition: A | Discharge status: Home / Self Care | Payer: BC Managed Care – PPO | Attending: Emergency Medicine | Admitting: Emergency Medicine

## 2021-04-17 ENCOUNTER — Encounter (HOSPITAL_BASED_OUTPATIENT_CLINIC_OR_DEPARTMENT_OTHER): Payer: Self-pay | Admitting: Emergency Medicine

## 2021-04-17 DIAGNOSIS — Z3490 Encounter for supervision of normal pregnancy, unspecified, unspecified trimester: Secondary | ICD-10-CM

## 2021-04-17 DIAGNOSIS — R112 Nausea with vomiting, unspecified: Secondary | ICD-10-CM

## 2021-04-17 DIAGNOSIS — R072 Precordial pain: Secondary | ICD-10-CM

## 2021-04-17 LAB — URINALYSIS, ROUTINE W REFLEX MICROSCOPIC
Bilirubin Urine: NEGATIVE
Glucose, UA: NEGATIVE mg/dL
Hgb urine dipstick: NEGATIVE
Ketones, ur: NEGATIVE mg/dL
Leukocytes,Ua: NEGATIVE
Nitrite: NEGATIVE
Protein, ur: NEGATIVE mg/dL
Specific Gravity, Urine: >1.03 — ABNORMAL HIGH (ref 1.005–1.030)
pH: 6.5 (ref 5.0–8.0)

## 2021-04-17 LAB — TROPONIN I (HIGH SENSITIVITY)
Troponin I (High Sensitivity): <2 ng/L (ref <18)
Troponin I (High Sensitivity): <2 ng/L (ref <18)

## 2021-04-17 MED ORDER — PROMETHAZINE HCL 12.5 MG PO TABS: 12.5000 mg | ORAL_TABLET | Freq: Four times a day (QID) | ORAL | 0 refills | Status: DC | PRN

## 2021-04-17 MED ORDER — PROMETHAZINE HCL 25 MG/ML IJ SOLN
INTRAMUSCULAR | Status: AC
  Administered 2021-04-17: 25 mg via INTRAVENOUS
  Filled 2021-04-17: qty 1

## 2021-04-17 MED ORDER — SODIUM CHLORIDE 0.9 % IV SOLN
25.0000 mg | Freq: Four times a day (QID) | INTRAVENOUS | Status: DC | PRN
  Filled 2021-04-17: qty 1

## 2021-04-17 MED ORDER — SODIUM CHLORIDE 0.9 % IV SOLN: INTRAVENOUS | Status: DC | PRN

## 2021-04-17 MED ORDER — ACETAMINOPHEN 500 MG PO TABS
1000.0000 mg | ORAL_TABLET | Freq: Once | ORAL | Status: AC
  Administered 2021-04-17: 1000 mg via ORAL
  Filled 2021-04-17: qty 2

## 2021-04-17 MED ORDER — SODIUM CHLORIDE 0.9 % IV BOLUS
500.0000 mL | Freq: Once | INTRAVENOUS | Status: AC
  Administered 2021-04-17: 500 mL via INTRAVENOUS
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
## 2021-04-30 ENCOUNTER — Encounter (HOSPITAL_COMMUNITY)
Admission: RE | Disposition: A | Discharge status: Home / Self Care | Payer: Self-pay | Source: Ambulatory Visit | Attending: Obstetrics & Gynecology

## 2021-04-30 ENCOUNTER — Other Ambulatory Visit: Payer: Self-pay | Admitting: Obstetrics & Gynecology

## 2021-04-30 ENCOUNTER — Ambulatory Visit (HOSPITAL_COMMUNITY): Payer: BC Managed Care – PPO | Admitting: Anesthesiology

## 2021-04-30 ENCOUNTER — Other Ambulatory Visit: Payer: Self-pay

## 2021-04-30 ENCOUNTER — Observation Stay (HOSPITAL_COMMUNITY)
Admission: RE | Admit: 2021-04-30 | Discharge: 2021-05-01 | Disposition: A | Discharge status: Home / Self Care | Payer: BC Managed Care – PPO | Source: Ambulatory Visit | Attending: Obstetrics & Gynecology | Admitting: Obstetrics & Gynecology

## 2021-04-30 ENCOUNTER — Ambulatory Visit (HOSPITAL_COMMUNITY): Payer: BC Managed Care – PPO

## 2021-04-30 ENCOUNTER — Encounter (HOSPITAL_COMMUNITY): Payer: Self-pay | Admitting: Obstetrics & Gynecology

## 2021-04-30 DIAGNOSIS — O074 Failed attempted termination of pregnancy without complication: Principal | ICD-10-CM | POA: Insufficient documentation

## 2021-04-30 DIAGNOSIS — R569 Unspecified convulsions: Secondary | ICD-10-CM | POA: Diagnosis not present

## 2021-04-30 DIAGNOSIS — F445 Conversion disorder with seizures or convulsions: Secondary | ICD-10-CM

## 2021-04-30 DIAGNOSIS — Z91010 Allergy to peanuts: Secondary | ICD-10-CM | POA: Insufficient documentation

## 2021-04-30 HISTORY — PX: DILATION AND EVACUATION: SHX1459

## 2021-04-30 LAB — CBC WITH DIFFERENTIAL/PLATELET
Abs Immature Granulocytes: 0.02 10*3/uL (ref 0.00–0.07)
Basophils Absolute: 0 10*3/uL (ref 0.0–0.1)
Basophils Relative: 0 %
Eosinophils Absolute: 0 10*3/uL (ref 0.0–0.5)
Eosinophils Relative: 0 %
HCT: 34.5 % — ABNORMAL LOW (ref 36.0–46.0)
Hemoglobin: 11.6 g/dL — ABNORMAL LOW (ref 12.0–15.0)
Immature Granulocytes: 0 %
Lymphocytes Relative: 10 %
Lymphs Abs: 1 10*3/uL (ref 0.7–4.0)
MCH: 28.2 pg (ref 26.0–34.0)
MCHC: 33.6 g/dL (ref 30.0–36.0)
MCV: 83.7 fL (ref 80.0–100.0)
Monocytes Absolute: 0.3 10*3/uL (ref 0.1–1.0)
Monocytes Relative: 3 %
Neutro Abs: 8.2 10*3/uL — ABNORMAL HIGH (ref 1.7–7.7)
Neutrophils Relative %: 87 %
Platelets: 279 10*3/uL (ref 150–400)
RBC: 4.12 MIL/uL (ref 3.87–5.11)
RDW: 14 % (ref 11.5–15.5)
WBC: 9.5 10*3/uL (ref 4.0–10.5)
nRBC: 0 % (ref 0.0–0.2)

## 2021-04-30 LAB — CBC
HCT: 36.1 % (ref 36.0–46.0)
Hemoglobin: 11.7 g/dL — ABNORMAL LOW (ref 12.0–15.0)
MCH: 28.4 pg (ref 26.0–34.0)
MCHC: 32.4 g/dL (ref 30.0–36.0)
MCV: 87.6 fL (ref 80.0–100.0)
Platelets: 245 10*3/uL (ref 150–400)
RBC: 4.12 MIL/uL (ref 3.87–5.11)
RDW: 14.2 % (ref 11.5–15.5)
WBC: 7.1 10*3/uL (ref 4.0–10.5)
nRBC: 0 % (ref 0.0–0.2)

## 2021-04-30 LAB — BASIC METABOLIC PANEL
Anion gap: 11 (ref 5–15)
BUN: 11 mg/dL (ref 6–20)
CO2: 23 mmol/L (ref 22–32)
Calcium: 9.1 mg/dL (ref 8.9–10.3)
Chloride: 104 mmol/L (ref 98–111)
Creatinine, Ser: 0.78 mg/dL (ref 0.44–1.00)
GFR, Estimated: >60 mL/min (ref >60)
Glucose, Bld: 83 mg/dL (ref 70–99)
Potassium: 3.8 mmol/L (ref 3.5–5.1)
Sodium: 138 mmol/L (ref 135–145)

## 2021-04-30 LAB — POCT I-STAT 7, (LYTES, BLD GAS, ICA,H+H)
Acid-Base Excess: 3 mmol/L — ABNORMAL HIGH (ref 0.0–2.0)
Bicarbonate: 25.3 mmol/L (ref 20.0–28.0)
Calcium, Ion: 1.2 mmol/L (ref 1.15–1.40)
HCT: 23 % — ABNORMAL LOW (ref 36.0–46.0)
Hemoglobin: 7.8 g/dL — ABNORMAL LOW (ref 12.0–15.0)
O2 Saturation: 100 %
Potassium: 5 mmol/L (ref 3.5–5.1)
Sodium: 137 mmol/L (ref 135–145)
TCO2: 26 mmol/L (ref 22–32)
pCO2 arterial: 28.1 mmHg — ABNORMAL LOW (ref 32.0–48.0)
pH, Arterial: 7.562 — ABNORMAL HIGH (ref 7.350–7.450)
pO2, Arterial: 193 mmHg — ABNORMAL HIGH (ref 83.0–108.0)

## 2021-04-30 LAB — HCG, QUANTITATIVE, PREGNANCY: hCG, Beta Chain, Quant, S: 13250 m[IU]/mL — ABNORMAL HIGH (ref <5)

## 2021-04-30 LAB — MAGNESIUM: Magnesium: 1.8 mg/dL (ref 1.7–2.4)

## 2021-04-30 SURGERY — DILATION AND EVACUATION, UTERUS
Anesthesia: General

## 2021-04-30 MED ORDER — LIDOCAINE HCL 1 % IJ SOLN
INTRAMUSCULAR | Status: DC | PRN
  Administered 2021-04-30: 20 mL

## 2021-04-30 MED ORDER — METOCLOPRAMIDE HCL 5 MG/ML IJ SOLN: 10.0000 mg | Freq: Four times a day (QID) | INTRAMUSCULAR | Status: DC | PRN

## 2021-04-30 MED ORDER — PROPOFOL 10 MG/ML IV BOLUS
50.0000 mg | Freq: Once | INTRAVENOUS | Status: AC
  Administered 2021-04-30: 50 mg via INTRAVENOUS

## 2021-04-30 MED ORDER — ONDANSETRON HCL 4 MG/2ML IJ SOLN
INTRAMUSCULAR | Status: DC | PRN
  Administered 2021-04-30: 4 mg via INTRAVENOUS

## 2021-04-30 MED ORDER — KETOROLAC TROMETHAMINE 30 MG/ML IJ SOLN: 30.0000 mg | Freq: Once | INTRAMUSCULAR | Status: DC

## 2021-04-30 MED ORDER — DEXAMETHASONE SODIUM PHOSPHATE 10 MG/ML IJ SOLN
INTRAMUSCULAR | Status: AC
  Filled 2021-04-30: qty 1

## 2021-04-30 MED ORDER — FENTANYL CITRATE (PF) 250 MCG/5ML IJ SOLN
INTRAMUSCULAR | Status: DC | PRN
  Administered 2021-04-30: 100 ug via INTRAVENOUS
  Administered 2021-04-30: 50 ug via INTRAVENOUS

## 2021-04-30 MED ORDER — CYCLOBENZAPRINE HCL 10 MG PO TABS: 10.0000 mg | ORAL_TABLET | Freq: Three times a day (TID) | ORAL | 3 refills | Status: DC | PRN

## 2021-04-30 MED ORDER — LACTATED RINGERS IV SOLN: INTRAVENOUS | Status: DC

## 2021-04-30 MED ORDER — ORAL CARE MOUTH RINSE: 15.0000 mL | Freq: Once | OROMUCOSAL | Status: AC

## 2021-04-30 MED ORDER — FENTANYL CITRATE (PF) 250 MCG/5ML IJ SOLN
INTRAMUSCULAR | Status: AC
  Filled 2021-04-30: qty 5

## 2021-04-30 MED ORDER — ACETAMINOPHEN 325 MG PO TABS
650.0000 mg | ORAL_TABLET | ORAL | Status: DC | PRN
  Administered 2021-05-01: 650 mg via ORAL
  Filled 2021-04-30 (×2): qty 2

## 2021-04-30 MED ORDER — CHLORHEXIDINE GLUCONATE 0.12% ORAL RINSE (MEDLINE KIT): 15.0000 mL | Freq: Two times a day (BID) | OROMUCOSAL | Status: DC

## 2021-04-30 MED ORDER — SODIUM CHLORIDE 0.9 % IV SOLN
75.0000 mL/h | INTRAVENOUS | Status: DC
  Administered 2021-04-30: 75 mL/h via INTRAVENOUS

## 2021-04-30 MED ORDER — DOXYCYCLINE HYCLATE 100 MG PO CAPS: 100.0000 mg | ORAL_CAPSULE | Freq: Two times a day (BID) | ORAL | 0 refills | Status: AC

## 2021-04-30 MED ORDER — MIDAZOLAM HCL 2 MG/2ML IJ SOLN
INTRAMUSCULAR | Status: AC
  Filled 2021-04-30: qty 2

## 2021-04-30 MED ORDER — ONDANSETRON HCL 4 MG/2ML IJ SOLN
INTRAMUSCULAR | Status: AC
  Filled 2021-04-30: qty 2

## 2021-04-30 MED ORDER — LORAZEPAM 2 MG/ML IJ SOLN
INTRAMUSCULAR | Status: AC
  Filled 2021-04-30: qty 1

## 2021-04-30 MED ORDER — SODIUM CHLORIDE 0.9 % IV SOLN
5.0000 mg/kg | Freq: Once | INTRAVENOUS | Status: AC
  Administered 2021-04-30: 363 mg via INTRAVENOUS
  Filled 2021-04-30: qty 7.26

## 2021-04-30 MED ORDER — PHENYLEPHRINE 40 MCG/ML (10ML) SYRINGE FOR IV PUSH (FOR BLOOD PRESSURE SUPPORT)
PREFILLED_SYRINGE | INTRAVENOUS | Status: AC
  Filled 2021-04-30: qty 10

## 2021-04-30 MED ORDER — ACETAMINOPHEN 500 MG PO TABS
1000.0000 mg | ORAL_TABLET | Freq: Once | ORAL | Status: AC
  Administered 2021-05-01: 1000 mg via ORAL
  Filled 2021-04-30: qty 2

## 2021-04-30 MED ORDER — 0.9 % SODIUM CHLORIDE (POUR BTL) OPTIME
TOPICAL | Status: DC | PRN
  Administered 2021-04-30: 1000 mL

## 2021-04-30 MED ORDER — LACTATED RINGERS IV SOLN: INTRAVENOUS | Status: DC | PRN

## 2021-04-30 MED ORDER — ACETAMINOPHEN 325 MG RE SUPP
650.0000 mg | RECTAL | Status: DC | PRN
  Filled 2021-04-30 (×3): qty 2
  Filled 2021-04-30: qty 1

## 2021-04-30 MED ORDER — LIDOCAINE HCL (PF) 1 % IJ SOLN
INTRAMUSCULAR | Status: AC
  Filled 2021-04-30: qty 30

## 2021-04-30 MED ORDER — LIDOCAINE HCL (CARDIAC) PF 100 MG/5ML IV SOSY
PREFILLED_SYRINGE | INTRAVENOUS | Status: DC | PRN
  Administered 2021-04-30: 100 mg via INTRAVENOUS

## 2021-04-30 MED ORDER — LIDOCAINE 2% (20 MG/ML) 5 ML SYRINGE
INTRAMUSCULAR | Status: AC
  Filled 2021-04-30: qty 10

## 2021-04-30 MED ORDER — PROPOFOL 10 MG/ML IV BOLUS
INTRAVENOUS | Status: DC | PRN
  Administered 2021-04-30: 200 mg via INTRAVENOUS

## 2021-04-30 MED ORDER — MIDAZOLAM HCL 2 MG/2ML IJ SOLN
1.0000 mg | Freq: Once | INTRAMUSCULAR | Status: AC
  Administered 2021-04-30: 1 mg via INTRAVENOUS

## 2021-04-30 MED ORDER — ORAL CARE MOUTH RINSE
15.0000 mL | OROMUCOSAL | Status: DC
  Administered 2021-05-01 (×3): 15 mL via OROMUCOSAL

## 2021-04-30 MED ORDER — IBUPROFEN 800 MG PO TABS: 800.0000 mg | ORAL_TABLET | Freq: Three times a day (TID) | ORAL | 3 refills | Status: DC | PRN

## 2021-04-30 MED ORDER — ONDANSETRON HCL 4 MG/2ML IJ SOLN
4.0000 mg | Freq: Four times a day (QID) | INTRAMUSCULAR | Status: DC | PRN
  Administered 2021-05-01: 4 mg via INTRAVENOUS
  Filled 2021-04-30: qty 2

## 2021-04-30 MED ORDER — CEFAZOLIN SODIUM-DEXTROSE 2-4 GM/100ML-% IV SOLN
2.0000 g | INTRAVENOUS | Status: AC
  Administered 2021-04-30: 2 g via INTRAVENOUS
  Filled 2021-04-30: qty 100

## 2021-04-30 MED ORDER — POVIDONE-IODINE 10 % EX SWAB
2.0000 "application " | Freq: Once | CUTANEOUS | Status: AC
  Administered 2021-04-30: 2 via TOPICAL

## 2021-04-30 MED ORDER — LORAZEPAM 2 MG/ML IJ SOLN
2.0000 mg | Freq: Once | INTRAMUSCULAR | Status: AC
  Administered 2021-04-30: 2 mg via INTRAVENOUS

## 2021-04-30 MED ORDER — GABAPENTIN (ONCE-DAILY) 300 MG PO TABS: 1.0000 | ORAL_TABLET | Freq: Two times a day (BID) | ORAL | 2 refills | Status: DC | PRN

## 2021-04-30 MED ORDER — PROPOFOL 10 MG/ML IV BOLUS
20.0000 mg | Freq: Once | INTRAVENOUS | Status: AC
  Administered 2021-04-30: 20 mg via INTRAVENOUS

## 2021-04-30 MED ORDER — GABAPENTIN 300 MG PO CAPS
300.0000 mg | ORAL_CAPSULE | Freq: Once | ORAL | Status: AC | PRN
  Administered 2021-05-01: 300 mg via ORAL
  Filled 2021-04-30: qty 1

## 2021-04-30 MED ORDER — DEXAMETHASONE SODIUM PHOSPHATE 10 MG/ML IJ SOLN
INTRAMUSCULAR | Status: DC | PRN
Start: 2021-04-30 — End: 2021-04-30
  Administered 2021-04-30: 5 mg via INTRAVENOUS

## 2021-04-30 MED ORDER — CHLORHEXIDINE GLUCONATE CLOTH 2 % EX PADS: 6.0000 | MEDICATED_PAD | Freq: Every day | CUTANEOUS | Status: DC

## 2021-04-30 MED ORDER — MIDAZOLAM HCL 2 MG/2ML IJ SOLN
INTRAMUSCULAR | Status: DC | PRN
  Administered 2021-04-30: 2 mg via INTRAVENOUS

## 2021-04-30 MED ORDER — LIDOCAINE 2% (20 MG/ML) 5 ML SYRINGE
INTRAMUSCULAR | Status: AC
  Filled 2021-04-30: qty 5

## 2021-04-30 MED ORDER — CHLORHEXIDINE GLUCONATE 0.12 % MT SOLN
15.0000 mL | Freq: Once | OROMUCOSAL | Status: AC
  Administered 2021-04-30: 15 mL via OROMUCOSAL
  Filled 2021-04-30: qty 15

## 2021-04-30 MED ORDER — PROPOFOL 10 MG/ML IV BOLUS
70.0000 mg | Freq: Once | INTRAVENOUS | Status: AC
  Administered 2021-04-30: 70 mg via INTRAVENOUS

## 2021-04-30 MED ORDER — LORAZEPAM 2 MG/ML IJ SOLN: 4.0000 mg | INTRAMUSCULAR | Status: DC | PRN

## 2021-04-30 MED ORDER — ONDANSETRON HCL 4 MG PO TABS: 4.0000 mg | ORAL_TABLET | Freq: Four times a day (QID) | ORAL | Status: DC | PRN

## 2021-04-30 MED ORDER — MIDAZOLAM HCL 2 MG/2ML IJ SOLN
1.0000 mg | INTRAMUSCULAR | Status: DC | PRN
  Administered 2021-04-30: 1 mg via INTRAVENOUS

## 2021-04-30 SURGICAL SUPPLY — 21 items
CATH ROBINSON RED A/P 16FR (CATHETERS) ×2 IMPLANT
DECANTER SPIKE VIAL GLASS SM (MISCELLANEOUS) IMPLANT
DILATOR CANAL MILEX (MISCELLANEOUS) IMPLANT
FILTER UTR ASPR ASSEMBLY (MISCELLANEOUS) ×2 IMPLANT
GLOVE SURG ENC MOIS LTX SZ6.5 (GLOVE) ×2 IMPLANT
GLOVE SURG UNDER POLY LF SZ7 (GLOVE) ×4 IMPLANT
GOWN STRL REUS W/ TWL LRG LVL3 (GOWN DISPOSABLE) ×2 IMPLANT
GOWN STRL REUS W/TWL LRG LVL3 (GOWN DISPOSABLE) ×4
HOSE CONNECTING 18IN BERKELEY (TUBING) ×2 IMPLANT
KIT BERKELEY 1ST TRI 3/8 NO TR (MISCELLANEOUS) ×2 IMPLANT
KIT BERKELEY 1ST TRIMESTER 3/8 (MISCELLANEOUS) ×2 IMPLANT
NS IRRIG 1000ML POUR BTL (IV SOLUTION) ×2 IMPLANT
PACK VAGINAL MINOR WOMEN LF (CUSTOM PROCEDURE TRAY) ×2 IMPLANT
PAD OB MATERNITY 4.3X12.25 (PERSONAL CARE ITEMS) ×2 IMPLANT
SET BERKELEY SUCTION TUBING (SUCTIONS) ×2 IMPLANT
TOWEL GREEN STERILE FF (TOWEL DISPOSABLE) ×4 IMPLANT
UNDERPAD 30X36 HEAVY ABSORB (UNDERPADS AND DIAPERS) ×2 IMPLANT
VACURETTE 10 RIGID CVD (CANNULA) IMPLANT
VACURETTE 7MM CVD STRL WRAP (CANNULA) IMPLANT
VACURETTE 8 RIGID CVD (CANNULA) ×2 IMPLANT
VACURETTE 9 RIGID CVD (CANNULA) IMPLANT

## 2021-04-30 NOTE — Plan of Care (Signed)
Evaluated patient who has improved from prior, now moving her left upper extremity some and tremoring less overall.  The tremoring that she has remains suppressible and she continues to protect herself, with intact pupils, corneals, EOM, and improved facial reactivity Answered family's questions at bedside about psychogenic nonepileptic events, EEG, head CT, favorable exam findings, and supportive care. Patient's tachycardia improved from 130s to 110s over the course of my time in the room.  Family expressed appreciation and all of their questions were answered  Lesleigh Noe MD-PhD [facility] [phone]
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
## 2021-04-30 NOTE — Anesthesia Procedure Notes (Signed)
Procedure Name: LMA Insertion Date/Time: 04/30/2021 12:04 PM Performed by: Glynda Jaeger, CRNA Pre-anesthesia Checklist: Patient identified, Emergency Drugs available, Suction available and Patient being monitored Patient Re-evaluated:Patient Re-evaluated prior to induction Oxygen Delivery Method: Circle System Utilized Preoxygenation: Pre-oxygenation with 100% oxygen Induction Type: IV induction Ventilation: Mask ventilation without difficulty LMA: LMA inserted LMA Size: 4.0 Number of attempts: 1 Airway Equipment and Method: Bite block Placement Confirmation: positive ETCO2 Tube secured with: Tape Dental Injury: Teeth and Oropharynx as per pre-operative assessment

## 2021-04-30 NOTE — Transfer of Care (Signed)
Immediate Anesthesia Transfer of Care Note  Patient: Alisha Reynolds  Procedure(s) Performed: DILATATION AND EVACUATION  Patient Location: PACU  Anesthesia Type:General  Level of Consciousness: awake, alert , patient cooperative and responds to stimulation  Airway & Oxygen Therapy: Patient connected to face mask oxygen  Post-op Assessment: Report given to RN and Post -op Vital signs reviewed and stable  Post vital signs: Reviewed and stable  Last Vitals:  Vitals Value Taken Time  BP 141/59 04/30/21 1311  Temp 37.7 C 04/30/21 1300  Pulse 127 04/30/21 1312  Resp 22 04/30/21 1312  SpO2 100 % 04/30/21 1312  Vitals shown include unvalidated device data.  Last Pain:  Vitals:   04/30/21 1036  TempSrc:   PainSc: 8       Patients Stated Pain Goal: 0 (00/37/04 8889)  Complications: No notable events documented.
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
## 2021-04-30 NOTE — Progress Notes (Signed)
Upon arrival to PACU pt developed seizure-like activity: unresponsive to verbal stimulus, shaking her arms, legs and back with associated tachycardia.  Bp and O2 sat, remained stable.  Initial treatment with propofol stopped this activity for some time, but activity returned.  She was also given versed and additional propofol doses to keep this activity minimized.  Labs were drawn and neurology was consulted.  We continued to treat her until the neurologist arrived so they could see her activity.  She evaluate the pt and felt the activity was not a true seizure.  Neurology was comfortable sending her to CT and obtaining a EEG without further medication.

## 2021-04-30 NOTE — Anesthesia Postprocedure Evaluation (Signed)
Anesthesia Post Note  Patient: SAMEERA BETTON  Procedure(s) Performed: DILATATION AND EVACUATION     Patient location during evaluation: PACU Anesthesia Type: General Level of consciousness: sedated and lethargic Pain management: pain level controlled Vital Signs Assessment: post-procedure vital signs reviewed and stable Respiratory status: spontaneous breathing and respiratory function stable Cardiovascular status: stable Postop Assessment: no apparent nausea or vomiting Anesthetic complications: yes Comments: Pt undergoing neurologic workup for seizure-like behavior.   Encounter Notable Events  Notable Event Outcome Phase Comment  Unplanned hospital admit / 23 hour stay  In Recovery Unexplained seizure like behavior    Last Vitals:  Vitals:   04/30/21 1730 04/30/21 1811  BP: 135/70 140/66  Pulse: (!) 130 (!) 120  Resp: (!) 23   Temp: 36.5 C 37.6 C  SpO2: 96%     Last Pain:  Vitals:   04/30/21 1811  TempSrc: Axillary  PainSc:                  Benaiah Behan DANIEL

## 2021-04-30 NOTE — Progress Notes (Signed)
Late Entry MD Note  Called to patient bedside in the PACU soon after resolution of surgery.  Patient with visible tonic / clonic seizures and unresponsive.  Her eyes appeared to be rolling back.  Vitals were reviewed and were within normal limits.  Airway protected and oxygenation was noted to be good.  Anesthesia at bedside, and patient given small doses of propofol, which would help temporarily stop the seizure activity, but then it would resume after several minutes.   Urgent Neurology consultation placed. Head CT w/o contrast performed and EEG ordered.   Will f/u Neurology recommendations - will hold discharge for now.   Patient's family member updated.   Rogelio Seen Stellar Gensel
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
## 2021-04-30 NOTE — Progress Notes (Signed)
Patient is unavailable for STAT EEG at this time.  She is in CT.  We will re-attempt.

## 2021-04-30 NOTE — Progress Notes (Signed)
EEG at bedside.

## 2021-04-30 NOTE — Progress Notes (Signed)
Upon arrival to PACU pt verbally stated that she is in pain and feels like she is going to faint. She then developed seizure-like activity: unresponsive to verbal stimulus, shaking her arms, legs and back with associated tachycardia.  Bp and O2 sat, remained stable.  Initial treatment with propofol stopped this activity for some time, but activity returned.  She was also given versed and additional propofol doses to keep this activity minimized.  Labs were drawn and neurology was consulted.  We continued to treat her until the neurologist arrived so they could see her activity.  She evaluate the pt and felt the activity was not a true seizure.  Patient transported to CT with RR RN. 2 mg Ativan given in CT. Patient brought back to PACU still unresponsive. VSS. RN will continue to monitor. Foley placed.

## 2021-04-30 NOTE — Progress Notes (Signed)
Patient was currently experiencing tremors in LUE extremity and RLE. Tremors have moved throughout her body at different times according to mother and significant other at the bedside. Initally both legs, then her arms, then her R side tremors.   Despite the tremors, patient was able to respond when asked to hold up her L. Arm or R leg. The R. Arm and L. Leg were unresponsive.

## 2021-04-30 NOTE — Progress Notes (Signed)
GYN Progress Note  In to evaluate patient with Wilford Sports, CNM. Received call from primary nursing earlier in the evening regarding patient's tremors on both sides of her body as well as tachycardia up to the 140s. Patient receiving Versed 1mg  q3h PRN. Family is concerned regarding her status. At the time of my arrival to the room, patient's mother and her significant other state that Neurology has just been in to evaluate the patient. EEG does not reveal true seizure activity. EKG preliminary shows sinus tachycardia.  On exam, patient is unresponsive, mild tremors present on right LE and LUE. Cardio - tachycardic to 120s but no murmurs, Pulm - CTAB anteriorly, Abd - Normoactive bowel sounds, soft, non-distended, Ext - Trace bilateral LE edema, Pelvic - No bleeding noted at perineum. Foley in place with ~350cc clear yellow urine in bag.  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 Previously spoke with Dr. Cheral Marker who states that in the absence of abnormal bleeding from complication from the procedure, could consider Ativan 2mg  IV q4h PRN (will defer to Neurology regarding this). From a GYN standpoint, exam is overall unremarkable. STAT CBC ordered to re-evaluate H/H.  Drema Dallas, DO

## 2021-04-30 NOTE — Progress Notes (Signed)
EEG complete - results pending 

## 2021-04-30 NOTE — Procedures (Signed)
Patient Name: Alisha Reynolds  MRN: [id number]  Epilepsy Attending: Lora Havens  Referring Physician/Provider: Dr Lesleigh Noe Date: 04/30/2021 Duration: 22.57 mins  Patient history: 25yo F with generalized tonic-clonic seizure like activity. EEG to evaluate for seizure  Level of alertness: Awake  AEDs during EEG study: Ativan, versed  Technical aspects: This EEG study was done with scalp electrodes positioned according to the 10-20 International system of electrode placement. Electrical activity was acquired at a sampling rate of 500Hz  and reviewed with a high frequency filter of 70Hz  and a low frequency filter of 1Hz . EEG data were recorded continuously and digitally stored.   Description: EEG showed an excessive amount of 15 to 18 Hz beta activity distributed symmetrically and diffusely. Patient was noted to have non rhythmic whole body twitching. Concomitant EEG before, during and after the event did not show any EEG changes suggest seizure. Hyperventilation and photic stimulation were not performed.     IMPRESSION: This study is within normal limits. The excessive beta activity seen in the background is most likely due to the effect of benzodiazepine and is a benign EEG pattern. No seizures or epileptiform discharges were seen throughout the recording.  Patient was noted to have non rhythmic whole body twitching without concomitant EEG change and was a NON epileptic event.  Dr Curly Shores was notified.   Niclas Markell Barbra Sarks

## 2021-04-30 NOTE — Anesthesia Preprocedure Evaluation (Addendum)
Anesthesia Evaluation  Patient identified by MRN, date of birth, ID band Patient awake    Reviewed: Allergy & Precautions, NPO status , Patient's Chart, lab work & pertinent test results  History of Anesthesia Complications Negative for: history of anesthetic complications  Airway Mallampati: II  TM Distance: >3 FB Neck ROM: Full    Dental no notable dental hx. (+) Dental Advisory Given Braces :   Pulmonary asthma (childhood) ,    Pulmonary exam normal        Cardiovascular negative cardio ROS Normal cardiovascular exam     Neuro/Psych  Headaches, PSYCHIATRIC DISORDERS Anxiety Depression    GI/Hepatic negative GI ROS, Neg liver ROS,   Endo/Other  negative endocrine ROS  Renal/GU negative Renal ROS     Musculoskeletal  Congenital hip dysplasia    Abdominal   Peds  Hematology  (+) anemia ,   Anesthesia Other Findings Covid test negative   Reproductive/Obstetrics (+) Pregnancy  Angular pregnancy                             Anesthesia Physical  Anesthesia Plan  ASA: 2  Anesthesia Plan: General   Post-op Pain Management:    Induction: Intravenous  PONV Risk Score and Plan: 4 or greater and Ondansetron, Scopolamine patch - Pre-op, Midazolam and Dexamethasone  Airway Management Planned: LMA  Additional Equipment: None  Intra-op Plan:   Post-operative Plan: Extubation in OR  Informed Consent: I have reviewed the patients History and Physical, chart, labs and discussed the procedure including the risks, benefits and alternatives for the proposed anesthesia with the patient or authorized representative who has indicated his/her understanding and acceptance.     Dental advisory given  Plan Discussed with: Anesthesiologist and CRNA  Anesthesia Plan Comments:        Anesthesia Quick Evaluation

## 2021-04-30 NOTE — Op Note (Addendum)
Operative Report  Alisha Reynolds female 25 y.o. 04/30/21  Procedure:    * DILATATION AND EVACUATION - Choice  Preoperative Diagnosis:  Retained Products of conception s/p ETOP  Post operative Diagnosis:  same  Indications: The patient with retained products of conception after failed termination in [place]     Surgeon: Sanjuana Kava   Assistants: none  Anesthesia: General LMA anesthesia and Local anesthesia 1% buffered lidocaine  ASA Class: 2  Procedure Detail  DILATATION AND EVACUATION  Findings:  8 week size uterus to 6 size post procedure. Moderate amount of retained POC.  Good crie was achieved.  Estimated Blood Loss:  50 mL         Drains: straight cath prior to procedure  UOP: 100 mL         Total IV Fluids: 200 ml  Blood Given: none          Specimens: POC         Implants: none        Complications:  * No complications entered in OR log *         Technique:   Patient was placed in dorsal lithotomy position in [employer]. After adequate anesthesia was achieved, the patient was prepped and draped in the usual sterile fashion. The operative Graves speculum was placed in the vagina and the cervix stabilized with a single-tooth tenaculum.  A paracervical block using 1% lidocaine was performed. The cervix was dilated with Hank dilators and a 8 mm curved curette was used to remove contents of the uterus.  Alternating sharp curettage with a curette and suction curettage was performed until all contents were removed and good crie was achieved.  All instruments were removed from the vagina.  The patient tolerated the procedure well.    Disposition: PACU - hemodynamically stable.         Condition: stable  [employer]

## 2021-05-01 ENCOUNTER — Encounter (HOSPITAL_COMMUNITY): Payer: Self-pay | Admitting: Obstetrics & Gynecology

## 2021-05-01 DIAGNOSIS — O074 Failed attempted termination of pregnancy without complication: Secondary | ICD-10-CM | POA: Diagnosis not present

## 2021-05-01 DIAGNOSIS — F445 Conversion disorder with seizures or convulsions: Secondary | ICD-10-CM | POA: Diagnosis not present

## 2021-05-01 LAB — HIV ANTIBODY (ROUTINE TESTING W REFLEX): HIV Screen 4th Generation wRfx: NONREACTIVE

## 2021-05-01 LAB — SURGICAL PATHOLOGY

## 2021-05-01 MED ORDER — KETOROLAC TROMETHAMINE 30 MG/ML IJ SOLN
30.0000 mg | Freq: Four times a day (QID) | INTRAMUSCULAR | Status: DC | PRN
  Administered 2021-05-01 (×2): 30 mg via INTRAVENOUS
  Filled 2021-05-01 (×2): qty 1

## 2021-05-01 NOTE — TOC Transition Note (Signed)
Transition of Care [facility]) - CM/SW Discharge Note   Patient Details  Name: Alisha Reynolds MRN: [id number] Date of Birth: Dec 09, 1995  Transition of Care [facility]) CM/SW Contact:  Pollie Friar, RN Phone Number: 05/01/2021, 12:42 PM   Clinical Narrative:    Patient discharging home with self care. No needs per TOC.   Final next level of care: Home/Self Care Barriers to Discharge: No Barriers Identified   Patient Goals and CMS Choice        Discharge Placement                       Discharge Plan and Services                                     Social Determinants of Health (SDOH) Interventions     Readmission Risk Interventions Readmission Risk Prevention Plan 03/11/2020  Post Dischage Appt Complete  Medication Screening Complete  Transportation Screening Complete  Some recent data might be hidden

## 2021-05-01 NOTE — Progress Notes (Signed)
Patient has passed swallow screen; desires food but it currently tolerating ice chips and sips of water.Awaiting diet orders from MD.    05/01/21 0220  Pre-Screen Questions- If "YES" to any of the following questions, STOP the screen, keep NPO, and place order for SLP eval and treat   Home diet required thickened liquids No  Trach tube present No  Radiation to Head/Neck No  Patient Readiness for Screen - If "YES" to any of the following questions, WAIT to screen, keep NPO  Is patient lethargic or unable to stay alert/awake? No  HOB restricted to <30 degrees No  NPO for planned procedure No  Brief Cognitive Screen- Aspiration Risk Assessment  Is patient oriented to name, place, or year? Yes  Able to open mouth, stick out tongue, or smile Yes  Oral Mechanism Exam  Able to seal lips Yes  Able to move tongue from side to side Yes  Face is symmetric Yes  Mandatory Oral Care Performed   Mandatory oral care performed Yes  3 oz Water Swallow Challenge  Does the patient stop drinking? No  Does the patient cough/choke? No  Screening Result Passed

## 2021-05-01 NOTE — Progress Notes (Signed)
Patient is currently talking and laughing. AAO x4. Passed yale swallow screen. Would like a diet order so she can eat something.
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
## 2021-05-01 NOTE — Progress Notes (Signed)
Observational/Subjective note:  Patient had tremors throughout her chest and arms, with some gasping for breath before eyes spontaneously opened. Patient looking around and beckoning significant other to come to her bedside.   When guided on orientation and explained where she was by family and myself, she looked concerned. When asked about being in pain, she nodded and pointed to her groin with her L. Arm (which was previously not moved by patient earlier in the shift). She also pointed to her significant other's L. Hip in attempt to explain her own pain.   Patient is non-verbal at this time but when asked questions centered around orientation and who people are around her, she responds appropriately with head nods or shaking. When asked about food or drink, she nodded profusely and smiled faintly while grabbing my hand.   I did give her a few ice chips, which I explained for her to hold in her mouth. She nodded and chewed on the ice, also holding in her mouth to melt.  Will page Neuro Night MD for any further instruction and possible IV medication since patient is NPO at this time until more stable.

## 2021-05-01 NOTE — Discharge Summary (Signed)
GYN Discharge Summary  Patient Name: Alisha Reynolds DOB: May 25, 1996 MRN: [id number]  Date of admission: 04/30/2021 Intrauterine pregnancy: [redacted]w[redacted]d   Admitting diagnosis: Seizure in response to acute event [facility] [address]) [R56.9] Secondary diagnosis:  D & E  Date of discharge: 05/01/2021    Discharge diagnosis:  S/P D&E       History: G2P0010   EDC : 12/01/2021, by Last Menstrual Period  Retained products of conception s/p ETOP, failed termination in [place] Primary provider : [facility]  Prenatal Labs: ABO, Rh: --/--/A POS Performed at [facility], [facility] [address] [place], [place] [address]  [phone] [id number]) / Antibody: NEG (05/03 1812)                                   Hospital course:  Admitted for D&E on 04/30/21. She was initially responsive after her procedure, able to tell the nurse she was "going to pass out".  Then she started having tonic-clonic seizure like activity, which she received multiple doses of propofol and Versed before neurology was contacted for concern for status epilepticus. EEG was WNL.    Tremors continuing. Upon entering room tremors noted to bilateral arms and right leg. Elevation in HR from 120 to 140's with touch. Pt will follow command to hold up left arm but not right. Not opening eyes. Brow furrows when talked to. Temp is 99.6. Pt to receive tylenol suppository.  Dr Delora Fuel notified of pt status. EKG ordered.   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  She discussed with me and significant stressors she has been dealing with and agreed some additional psychological/psychiatric support may be beneficial for her   Recommendations: -No further inpatient neurology work-up -Outpatient psychiatry/psychology referral to be placed by primary team or PCP -Neurology will be available on an as-needed basis going forward   Lesleigh Noe MD-PhD [facility] [phone]   Available 7 AM to 7 PM, outside these hours please contact Neurologist on call listed on AMION     Labs: Lab Results  Component Value Date   WBC 9.5 04/30/2021   HGB 11.6 (L) 04/30/2021   HCT 34.5 (L) 04/30/2021   MCV 83.7 04/30/2021   PLT 279 04/30/2021   CMP Latest Ref Rng & Units 04/30/2021  Glucose 70 - 99 mg/dL -  BUN 6 - 20 mg/dL -  Creatinine 0.44 - 1.00 mg/dL -  Sodium 135 - 145 mmol/L 137  Potassium 3.5 - 5.1 mmol/L 5.0  Chloride 98 - 111 mmol/L -  CO2 22 - 32 mmol/L -  Calcium 8.9 - 10.3 mg/dL -  Total Protein 6.5 - 8.1 g/dL -  Total Bilirubin 0.3 - 1.2 mg/dL -  Alkaline Phos 38 - 126 U/L -  AST 15 - 41 U/L -  ALT 0 - 44 U/L -    Physical Exam @ time of discharge:  Vitals:   05/01/21 0408 05/01/21 0508 05/01/21 0700 05/01/21 1151  BP: 115/68 (!) 98/53 (!) 103/59 (!) 112/55  Pulse: 82 78 73 80  Resp: 20 16 16 14   Temp: 98.9 F (37.2 C) 98.8 F (37.1 C) 98.6 F (37 C) 98.5 F (36.9 C)  TempSrc: Oral Oral Oral Oral  SpO2: 98% 98% 97% 99%  Weight:      Height:       Gen: alert, oriented x3 Resp: EBBS CTA Cardiac:  RRR without murmur Abd: soft, nontender   Neuro: MS: Awake, alert, oriented CN ( Per neurology) Pupils equal round reactive to light, EOMI, face symmetric in movement and sensation, tongue midline Motor: No pronator drift in the bilateral upper extremities.  Freely moving the bilateral lower extremities. Sensory: Intact to light touch throughout DTR: 2+/2+  Discharge instructions:  Follow up with [facility] in 1 week for HCG level. Make appointment with Dr Alwyn Pea.  Discharge Medications:  Allergies as of 05/01/2021       Reactions   Other Anaphylaxis   All types of nuts   Peanut-containing Drug Products Anaphylaxis   All types of nuts.   Oxycodone Other (See Comments)   Hallucinations   Tramadol Other (See Comments)   Throat and tongue swelled up        Medication List     TAKE these medications    cyclobenzaprine 10 MG  tablet Commonly known as: FLEXERIL Take 1 tablet (10 mg total) by mouth every 8 (eight) hours as needed for muscle spasms.   doxycycline 100 MG capsule Commonly known as: VIBRAMYCIN Take 1 capsule (100 mg total) by mouth 2 (two) times daily for 7 days.   folic acid 520 MCG tablet Commonly known as: FOLVITE Take 400 mcg by mouth daily.   Gabapentin (Once-Daily) 300 MG Tabs Take 1 tablet by mouth 2 (two) times daily as needed (severe pain).   ibuprofen 800 MG tablet Commonly known as: ADVIL Take 1 tablet (800 mg total) by mouth every 8 (eight) hours as needed for cramping or moderate pain.   prenatal multivitamin Tabs tablet Take 1 tablet by mouth daily at 12 noon.   promethazine 12.5 MG tablet Commonly known as: PHENERGAN Take 1 tablet (12.5 mg total) by mouth every 6 (six) hours as needed for nausea or vomiting.       Diet: routine diet Activity: Advance as tolerated. Pelvic rest. Follow up:1 week  Signed: Domingo Pulse MSN, CNM 05/01/2021, 12:30 PM

## 2021-05-14 DIAGNOSIS — N898 Other specified noninflammatory disorders of vagina: Secondary | ICD-10-CM | POA: Insufficient documentation

## 2021-06-22 ENCOUNTER — Other Ambulatory Visit: Payer: Self-pay

## 2021-06-22 ENCOUNTER — Emergency Department (HOSPITAL_COMMUNITY)
Admission: EM | Admit: 2021-06-22 | Discharge: 2021-06-22 | Disposition: A | Discharge status: Home / Self Care | Payer: BC Managed Care – PPO | Attending: Emergency Medicine | Admitting: Emergency Medicine

## 2021-06-22 ENCOUNTER — Emergency Department (HOSPITAL_COMMUNITY): Payer: BC Managed Care – PPO

## 2021-06-22 DIAGNOSIS — Y906 Blood alcohol level of 120-199 mg/100 ml: Secondary | ICD-10-CM | POA: Diagnosis not present

## 2021-06-22 DIAGNOSIS — J4520 Mild intermittent asthma, uncomplicated: Secondary | ICD-10-CM | POA: Insufficient documentation

## 2021-06-22 DIAGNOSIS — F10929 Alcohol use, unspecified with intoxication, unspecified: Secondary | ICD-10-CM

## 2021-06-22 DIAGNOSIS — R4182 Altered mental status, unspecified: Secondary | ICD-10-CM | POA: Diagnosis present

## 2021-06-22 DIAGNOSIS — R52 Pain, unspecified: Secondary | ICD-10-CM

## 2021-06-22 DIAGNOSIS — Z91010 Allergy to peanuts: Secondary | ICD-10-CM | POA: Diagnosis not present

## 2021-06-22 DIAGNOSIS — R1110 Vomiting, unspecified: Secondary | ICD-10-CM | POA: Diagnosis not present

## 2021-06-22 DIAGNOSIS — N9489 Other specified conditions associated with female genital organs and menstrual cycle: Secondary | ICD-10-CM | POA: Insufficient documentation

## 2021-06-22 LAB — CBC WITH DIFFERENTIAL/PLATELET
Abs Immature Granulocytes: 0.01 10*3/uL (ref 0.00–0.07)
Basophils Absolute: 0.1 10*3/uL (ref 0.0–0.1)
Basophils Relative: 1 %
Eosinophils Absolute: 0.4 10*3/uL (ref 0.0–0.5)
Eosinophils Relative: 7 %
HCT: 39.3 % (ref 36.0–46.0)
Hemoglobin: 12.4 g/dL (ref 12.0–15.0)
Immature Granulocytes: 0 %
Lymphocytes Relative: 31 %
Lymphs Abs: 2.1 10*3/uL (ref 0.7–4.0)
MCH: 27.6 pg (ref 26.0–34.0)
MCHC: 31.6 g/dL (ref 30.0–36.0)
MCV: 87.3 fL (ref 80.0–100.0)
Monocytes Absolute: 0.3 10*3/uL (ref 0.1–1.0)
Monocytes Relative: 5 %
Neutro Abs: 3.7 10*3/uL (ref 1.7–7.7)
Neutrophils Relative %: 56 %
Platelets: 306 10*3/uL (ref 150–400)
RBC: 4.5 MIL/uL (ref 3.87–5.11)
RDW: 13.9 % (ref 11.5–15.5)
WBC: 6.6 10*3/uL (ref 4.0–10.5)
nRBC: 0 % (ref 0.0–0.2)

## 2021-06-22 LAB — COMPREHENSIVE METABOLIC PANEL
ALT: 26 U/L (ref 0–44)
AST: 22 U/L (ref 15–41)
Albumin: 3.9 g/dL (ref 3.5–5.0)
Alkaline Phosphatase: 87 U/L (ref 38–126)
Anion gap: 9 (ref 5–15)
BUN: 12 mg/dL (ref 6–20)
CO2: 26 mmol/L (ref 22–32)
Calcium: 9.2 mg/dL (ref 8.9–10.3)
Chloride: 105 mmol/L (ref 98–111)
Creatinine, Ser: 0.88 mg/dL (ref 0.44–1.00)
GFR, Estimated: >60 mL/min (ref >60)
Glucose, Bld: 111 mg/dL — ABNORMAL HIGH (ref 70–99)
Potassium: 3.6 mmol/L (ref 3.5–5.1)
Sodium: 140 mmol/L (ref 135–145)
Total Bilirubin: 0.5 mg/dL (ref 0.3–1.2)
Total Protein: 7.1 g/dL (ref 6.5–8.1)

## 2021-06-22 LAB — ETHANOL: Alcohol, Ethyl (B): 176 mg/dL — ABNORMAL HIGH (ref <10)

## 2021-06-22 LAB — I-STAT BETA HCG BLOOD, ED (MC, WL, AP ONLY): I-stat hCG, quantitative: <5 m[IU]/mL (ref <5)

## 2021-06-22 LAB — SALICYLATE LEVEL: Salicylate Lvl: <7 mg/dL — ABNORMAL LOW (ref 7.0–30.0)

## 2021-06-22 LAB — ACETAMINOPHEN LEVEL: Acetaminophen (Tylenol), Serum: <10 ug/mL — ABNORMAL LOW (ref 10–30)
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
## 2021-06-22 NOTE — ED Notes (Signed)
Pt would not wake up for vitals
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
## 2021-06-22 NOTE — ED Provider Notes (Signed)
Emergency Medicine Provider Triage Evaluation Note  Alisha Reynolds , a 25 y.o. female  was evaluated in triage.  Pt complains of intoxication.  Apparently was out drinking earlier tonight became agressive and combative with EMS upon arrival.  She was given 5mg  haldol and 5mg  versed.  Apparently has been upset as she recent had an abortion.  Review of Systems  Positive: Intoxication, aggressive Negative: vomiting  Physical Exam  BP 100/62 (BP Location: Right Arm)   Pulse 68   Temp (!) 97.1 F (36.2 C) (Axillary)   Resp 18   Ht 5\' 1"  (1.549 m)   Wt 73 kg   LMP 02/24/2021   SpO2 100%   BMI 30.41 kg/m   Gen:   Awake, no distress   Resp:  Normal effort  MSK:   Moves extremities without difficulty  Other:    Medical Decision Making  Medically screening exam initiated at 2:41 AM.  Appropriate orders placed.  Alisha Reynolds was informed that the remainder of the evaluation will be completed by another provider, this initial triage assessment does not replace that evaluation, and the importance of remaining in the ED until their evaluation is complete.  EKG, labs.   Larene Pickett, PA-C 06/22/21 3545    Maudie Flakes, MD 06/22/21 [phone]

## 2021-06-22 NOTE — ED Provider Notes (Signed)
  Physical Exam  BP 100/62 (BP Location: Right Arm)   Pulse 68   Temp (!) 97.1 F (36.2 C) (Axillary)   Resp 18   Ht 5\' 1"  (1.549 m)   Wt 73 kg   LMP 02/24/2021   SpO2 100%   BMI 30.41 kg/m     ED Course/Procedures     Procedures  MDM    Per EMS, was intoxicated and agitated, got Haldol and Versed with EMS. Currently still somnolent, needs time to metabolize.   On repeat evaluation, the patient was GCS 15, alert and oriented x3, back to her baseline mental status.  She does not remember much of last night with her friend was able to provide additional history.  She reportedly was severely intoxicated, vomited and briefly aspirated with perioral cyanosis.  EMS was called and found her to be agitated so she was administered Haldol and Versed.  Since then she has been metabolizing and is now back to her baseline mental status and ready for discharge.  She denies any chest pain or shortness of breath.  She denies any cough.  She is afebrile.  She is saturating well on room air.  Overall stable for discharge with outpatient follow-up as needed.      Regan Lemming, MD 06/22/21 [phone]

## 2021-06-22 NOTE — ED Notes (Signed)
Pt is awake & walking to the restroom, no c/o any pain at this time.

## 2021-07-29 ENCOUNTER — Encounter (HOSPITAL_BASED_OUTPATIENT_CLINIC_OR_DEPARTMENT_OTHER): Payer: Self-pay | Admitting: Emergency Medicine

## 2021-07-29 ENCOUNTER — Other Ambulatory Visit: Payer: Self-pay

## 2021-07-29 ENCOUNTER — Emergency Department (HOSPITAL_BASED_OUTPATIENT_CLINIC_OR_DEPARTMENT_OTHER): Payer: BC Managed Care – PPO

## 2021-07-29 ENCOUNTER — Emergency Department (HOSPITAL_BASED_OUTPATIENT_CLINIC_OR_DEPARTMENT_OTHER)
Admission: EM | Admit: 2021-07-29 | Discharge: 2021-07-29 | Disposition: A | Discharge status: Home / Self Care | Payer: BC Managed Care – PPO | Attending: Emergency Medicine | Admitting: Emergency Medicine

## 2021-07-29 DIAGNOSIS — Z20822 Contact with and (suspected) exposure to covid-19: Secondary | ICD-10-CM | POA: Diagnosis not present

## 2021-07-29 DIAGNOSIS — Z91010 Allergy to peanuts: Secondary | ICD-10-CM | POA: Insufficient documentation

## 2021-07-29 DIAGNOSIS — A084 Viral intestinal infection, unspecified: Secondary | ICD-10-CM | POA: Diagnosis not present

## 2021-07-29 DIAGNOSIS — R1013 Epigastric pain: Secondary | ICD-10-CM | POA: Diagnosis present

## 2021-07-29 LAB — CBC
HCT: 39.5 % (ref 36.0–46.0)
Hemoglobin: 12.7 g/dL (ref 12.0–15.0)
MCH: 27.5 pg (ref 26.0–34.0)
MCHC: 32.2 g/dL (ref 30.0–36.0)
MCV: 85.7 fL (ref 80.0–100.0)
Platelets: 276 10*3/uL (ref 150–400)
RBC: 4.61 MIL/uL (ref 3.87–5.11)
RDW: 15 % (ref 11.5–15.5)
WBC: 5.9 10*3/uL (ref 4.0–10.5)
nRBC: 0 % (ref 0.0–0.2)

## 2021-07-29 LAB — URINALYSIS, ROUTINE W REFLEX MICROSCOPIC
Bilirubin Urine: NEGATIVE
Glucose, UA: NEGATIVE mg/dL
Hgb urine dipstick: NEGATIVE
Ketones, ur: 40 mg/dL — AB
Leukocytes,Ua: NEGATIVE
Nitrite: NEGATIVE
Specific Gravity, Urine: 1.034 — ABNORMAL HIGH (ref 1.005–1.030)
pH: 6 (ref 5.0–8.0)

## 2021-07-29 LAB — COMPREHENSIVE METABOLIC PANEL
ALT: 27 U/L (ref 0–44)
AST: 25 U/L (ref 15–41)
Albumin: 4.4 g/dL (ref 3.5–5.0)
Alkaline Phosphatase: 95 U/L (ref 38–126)
Anion gap: 11 (ref 5–15)
BUN: 13 mg/dL (ref 6–20)
CO2: 23 mmol/L (ref 22–32)
Calcium: 9.3 mg/dL (ref 8.9–10.3)
Chloride: 102 mmol/L (ref 98–111)
Creatinine, Ser: 0.69 mg/dL (ref 0.44–1.00)
GFR, Estimated: >60 mL/min (ref >60)
Glucose, Bld: 75 mg/dL (ref 70–99)
Potassium: 3.6 mmol/L (ref 3.5–5.1)
Sodium: 136 mmol/L (ref 135–145)
Total Bilirubin: 0.5 mg/dL (ref 0.3–1.2)
Total Protein: 7.5 g/dL (ref 6.5–8.1)

## 2021-07-29 LAB — RESP PANEL BY RT-PCR (FLU A&B, COVID) ARPGX2
Influenza A by PCR: NEGATIVE
Influenza B by PCR: NEGATIVE
SARS Coronavirus 2 by RT PCR: NEGATIVE

## 2021-07-29 LAB — LIPASE, BLOOD: Lipase: 12 U/L (ref 11–51)

## 2021-07-29 LAB — PREGNANCY, URINE: Preg Test, Ur: NEGATIVE

## 2021-07-29 MED ORDER — DIPHENHYDRAMINE HCL 50 MG/ML IJ SOLN
12.5000 mg | Freq: Once | INTRAMUSCULAR | Status: AC
  Administered 2021-07-29: 12.5 mg via INTRAVENOUS
  Filled 2021-07-29: qty 1

## 2021-07-29 MED ORDER — MORPHINE SULFATE (PF) 4 MG/ML IV SOLN
4.0000 mg | Freq: Once | INTRAVENOUS | Status: AC
  Administered 2021-07-29: 4 mg via INTRAVENOUS
  Filled 2021-07-29: qty 1

## 2021-07-29 MED ORDER — KETOROLAC TROMETHAMINE 30 MG/ML IJ SOLN
30.0000 mg | Freq: Once | INTRAMUSCULAR | Status: AC
  Administered 2021-07-29: 30 mg via INTRAVENOUS
  Filled 2021-07-29: qty 1

## 2021-07-29 MED ORDER — ONDANSETRON HCL 4 MG/2ML IJ SOLN
4.0000 mg | Freq: Once | INTRAMUSCULAR | Status: AC
  Administered 2021-07-29: 4 mg via INTRAVENOUS
  Filled 2021-07-29: qty 2

## 2021-07-29 MED ORDER — SODIUM CHLORIDE 0.9 % IV BOLUS
1000.0000 mL | Freq: Once | INTRAVENOUS | Status: AC
  Administered 2021-07-29: 1000 mL via INTRAVENOUS

## 2021-07-29 MED ORDER — PANTOPRAZOLE SODIUM 40 MG PO TBEC: 40.0000 mg | DELAYED_RELEASE_TABLET | Freq: Once | ORAL | Status: DC

## 2021-07-29 MED ORDER — IOHEXOL 300 MG/ML  SOLN
100.0000 mL | Freq: Once | INTRAMUSCULAR | Status: AC | PRN
  Administered 2021-07-29: 100 mL via INTRAVENOUS

## 2021-07-29 NOTE — ED Notes (Signed)
Patient transported to CT 

## 2021-07-29 NOTE — ED Triage Notes (Signed)
Pt arrives to ED with c/o abdominal pain. The pain is located epigastric region. This started x2 days ago. This morning pt reports nausea, vomiting, fever. Vomiting x2 today. Abd pain is described as constant and dull.

## 2021-07-29 NOTE — ED Notes (Signed)
EMT-P rovided AVS using Teachback Method. Patient verbalizes understanding of Discharge Instructions. Opportunity for Questioning and Answers were provided by EMT-P. Patient Discharged from ED.
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
## 2021-07-29 NOTE — Discharge Instructions (Addendum)
Please read the information about gastroenteritis attached to these discharge papers.  Make sure you stay hydrated and utilize the Zofran at home for any nausea.  Follow-up with your primary care provider as needed.  I have also attached a gastroenterologist to these papers for you to utilize if you see fit

## 2021-09-05 ENCOUNTER — Other Ambulatory Visit: Payer: Self-pay

## 2021-09-05 ENCOUNTER — Ambulatory Visit (INDEPENDENT_AMBULATORY_CARE_PROVIDER_SITE_OTHER): Payer: BC Managed Care – PPO | Admitting: Plastic Surgery

## 2021-09-05 ENCOUNTER — Encounter: Payer: Self-pay | Admitting: Plastic Surgery

## 2021-09-05 DIAGNOSIS — R569 Unspecified convulsions: Secondary | ICD-10-CM

## 2021-09-05 DIAGNOSIS — S73111A Iliofemoral ligament sprain of right hip, initial encounter: Secondary | ICD-10-CM

## 2021-09-05 DIAGNOSIS — Q6589 Other specified congenital deformities of hip: Secondary | ICD-10-CM
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
## 2021-10-21 ENCOUNTER — Institutional Professional Consult (permissible substitution): Payer: BC Managed Care – PPO | Admitting: Plastic Surgery

## 2021-12-03 ENCOUNTER — Emergency Department (HOSPITAL_BASED_OUTPATIENT_CLINIC_OR_DEPARTMENT_OTHER): Payer: BC Managed Care – PPO

## 2021-12-03 ENCOUNTER — Emergency Department (HOSPITAL_BASED_OUTPATIENT_CLINIC_OR_DEPARTMENT_OTHER)
Admission: EM | Admit: 2021-12-03 | Discharge: 2021-12-03 | Disposition: A | Discharge status: Home / Self Care | Payer: BC Managed Care – PPO | Attending: Emergency Medicine | Admitting: Emergency Medicine

## 2021-12-03 ENCOUNTER — Encounter (HOSPITAL_BASED_OUTPATIENT_CLINIC_OR_DEPARTMENT_OTHER): Payer: Self-pay | Admitting: Emergency Medicine

## 2021-12-03 DIAGNOSIS — R630 Anorexia: Secondary | ICD-10-CM | POA: Insufficient documentation

## 2021-12-03 DIAGNOSIS — N898 Other specified noninflammatory disorders of vagina: Secondary | ICD-10-CM | POA: Insufficient documentation

## 2021-12-03 DIAGNOSIS — D649 Anemia, unspecified: Secondary | ICD-10-CM | POA: Diagnosis not present

## 2021-12-03 DIAGNOSIS — R110 Nausea: Secondary | ICD-10-CM | POA: Diagnosis not present

## 2021-12-03 DIAGNOSIS — R102 Pelvic and perineal pain: Secondary | ICD-10-CM | POA: Diagnosis present

## 2021-12-03 DIAGNOSIS — Z91010 Allergy to peanuts: Secondary | ICD-10-CM | POA: Insufficient documentation

## 2021-12-03 HISTORY — DX: Acute vaginitis: N76.0

## 2021-12-03 LAB — URINALYSIS, ROUTINE W REFLEX MICROSCOPIC
Bilirubin Urine: NEGATIVE
Glucose, UA: NEGATIVE mg/dL
Hgb urine dipstick: NEGATIVE
Ketones, ur: NEGATIVE mg/dL
Leukocytes,Ua: NEGATIVE
Nitrite: NEGATIVE
Protein, ur: NEGATIVE mg/dL
Specific Gravity, Urine: 1.02 (ref 1.005–1.030)
pH: 6 (ref 5.0–8.0)

## 2021-12-03 LAB — COMPREHENSIVE METABOLIC PANEL
ALT: 18 U/L (ref 0–44)
AST: 16 U/L (ref 15–41)
Albumin: 4.6 g/dL (ref 3.5–5.0)
Alkaline Phosphatase: 79 U/L (ref 38–126)
Anion gap: 8 (ref 5–15)
BUN: 9 mg/dL (ref 6–20)
CO2: 28 mmol/L (ref 22–32)
Calcium: 9.7 mg/dL (ref 8.9–10.3)
Chloride: 104 mmol/L (ref 98–111)
Creatinine, Ser: 0.76 mg/dL (ref 0.44–1.00)
GFR, Estimated: >60 mL/min (ref >60)
Glucose, Bld: 84 mg/dL (ref 70–99)
Potassium: 3.7 mmol/L (ref 3.5–5.1)
Sodium: 140 mmol/L (ref 135–145)
Total Bilirubin: 0.3 mg/dL (ref 0.3–1.2)
Total Protein: 7.3 g/dL (ref 6.5–8.1)

## 2021-12-03 LAB — CBC
HCT: 37.4 % (ref 36.0–46.0)
Hemoglobin: 11.9 g/dL — ABNORMAL LOW (ref 12.0–15.0)
MCH: 27.2 pg (ref 26.0–34.0)
MCHC: 31.8 g/dL (ref 30.0–36.0)
MCV: 85.6 fL (ref 80.0–100.0)
Platelets: 300 10*3/uL (ref 150–400)
RBC: 4.37 MIL/uL (ref 3.87–5.11)
RDW: 14.4 % (ref 11.5–15.5)
WBC: 6.3 10*3/uL (ref 4.0–10.5)
nRBC: 0 % (ref 0.0–0.2)

## 2021-12-03 LAB — PREGNANCY, URINE: Preg Test, Ur: NEGATIVE

## 2021-12-03 LAB — WET PREP, GENITAL
Clue Cells Wet Prep HPF POC: NONE SEEN
Sperm: NONE SEEN
Trich, Wet Prep: NONE SEEN
WBC, Wet Prep HPF POC: <10 (ref <10)
Yeast Wet Prep HPF POC: NONE SEEN

## 2021-12-03 LAB — LIPASE, BLOOD: Lipase: 21 U/L (ref 11–51)

## 2021-12-03 MED ORDER — KETOROLAC TROMETHAMINE 15 MG/ML IJ SOLN
15.0000 mg | Freq: Once | INTRAMUSCULAR | Status: AC
  Administered 2021-12-03: 15 mg via INTRAVENOUS
  Filled 2021-12-03: qty 1

## 2021-12-03 MED ORDER — ONDANSETRON HCL 4 MG/2ML IJ SOLN
4.0000 mg | Freq: Once | INTRAMUSCULAR | Status: AC
Start: 2021-12-03 — End: 2021-12-03
  Administered 2021-12-03: 4 mg via INTRAVENOUS
  Filled 2021-12-03: qty 2

## 2021-12-03 MED ORDER — ACETAMINOPHEN 325 MG PO TABS
650.0000 mg | ORAL_TABLET | Freq: Once | ORAL | Status: AC
  Administered 2021-12-03: 650 mg via ORAL
  Filled 2021-12-03: qty 2

## 2021-12-03 MED ORDER — ONDANSETRON HCL 4 MG/2ML IJ SOLN
4.0000 mg | Freq: Once | INTRAMUSCULAR | Status: AC
  Administered 2021-12-03: 4 mg via INTRAVENOUS
  Filled 2021-12-03: qty 2

## 2021-12-03 MED ORDER — MORPHINE SULFATE (PF) 4 MG/ML IV SOLN
4.0000 mg | Freq: Once | INTRAVENOUS | Status: AC
  Administered 2021-12-03: 4 mg via INTRAVENOUS
  Filled 2021-12-03: qty 1

## 2021-12-03 NOTE — Discharge Instructions (Signed)
Please use Tylenol or ibuprofen for pain.  You may use 600 mg ibuprofen every 6 hours or 1000 mg of Tylenol every 6 hours.  You may choose to alternate between the 2.  This would be most effective.  Not to exceed 4 g of Tylenol within 24 hours.  Not to exceed 3200 mg ibuprofen 24 hours. ? ?As we discussed there is nothing on your work-up today to explain your pelvic pain or vaginal discharge.  Based on my evaluation your discharge.  Overall physiologic in nature, however since you are having significant pelvic pain it is worthwhile to evaluate for other abnormalities.  Since our ultrasound went down today prior to completing your evaluation I have scheduled you for an outpatient ultrasound at [facility] tomorrow, please report to [facility] anytime after 8 AM for completion of this outpatient imaging.  Otherwise please follow-up with your primary care or OB/GYN.  Please return to the emergency department if your pain worsens or fails to improve despite over-the-counter pain medication, time. ?

## 2021-12-03 NOTE — ED Provider Notes (Signed)
?[facility] EMERGENCY DEPT ?Provider Note ? ? ?CSN: [id number] ?Arrival date & time: 12/03/21  1356 ? ?  ? ?History ? ?Chief Complaint  ?Patient presents with  ? Pelvic Pain  ? ? ?Alisha Reynolds is a 26 y.o. female with a PMH significant for GAD, recurrent BV who presents with concern for pelvic pain, white vaginal discharge, as well as left flank pain. She says discharge is somewhat similar to previous BV but she has never had pelvic pain associated with it in the past. Reports some nausea, without vomiting, no fever, chills, diarrhea, does endorse some constipation. Patient denies dysuria, hematuria. LMP 5/2, similar to normal. Denies intra-abdominal surgery. Denies new sexual partner, endorses low concern for gonorrhea, chlamydia, HIV, syphilis at this time. Reports decreased appetite since onset of pain 3 days ago. ? ? ?Pelvic Pain ? ? ?  ? ?Home Medications ?Prior to Admission medications   ?Medication Sig Start Date End Date Taking? Authorizing Provider  ?cyclobenzaprine (FLEXERIL) 10 MG tablet Take 1 tablet (10 mg total) by mouth every 8 (eight) hours as needed for muscle spasms. 04/30/21   Sanjuana Kava, MD  ?folic acid (FOLVITE) 017 MCG tablet Take 400 mcg by mouth daily.    [provider]  ?Gabapentin, Once-Daily, 300 MG TABS Take 1 tablet by mouth 2 (two) times daily as needed (severe pain). 04/30/21   Sanjuana Kava, MD  ?ibuprofen (ADVIL) 800 MG tablet Take 1 tablet (800 mg total) by mouth every 8 (eight) hours as needed for cramping or moderate pain. 04/30/21   Sanjuana Kava, MD  ?Prenatal Vit-Fe Fumarate-FA (PRENATAL MULTIVITAMIN) TABS tablet Take 1 tablet by mouth daily at 12 noon.    [provider]  ?promethazine (PHENERGAN) 12.5 MG tablet Take 1 tablet (12.5 mg total) by mouth every 6 (six) hours as needed for nausea or vomiting. 04/17/21   Palumbo, April, MD  ?   ? ?Allergies    ?Other, Peanut-containing drug products, Oxycodone, Tramadol, and Pork-derived products   ? ?Review  of Systems   ?Review of Systems  ?Genitourinary:  Positive for pelvic pain and vaginal discharge.  ?All other systems reviewed and are negative. ? ?Physical Exam ?Updated Vital Signs ?BP 114/69 (BP Location: Right Arm)   Pulse 71   Temp 98.5 ?F (36.9 ?C) (Oral)   Resp 16   LMP 02/24/2021 Comment: neg preg test  SpO2 99%  ?Physical Exam ?Vitals and nursing note reviewed.  ?Constitutional:   ?   General: She is not in acute distress. ?   Appearance: Normal appearance.  ?HENT:  ?   Head: Normocephalic and atraumatic.  ?Eyes:  ?   General:     ?   Right eye: No discharge.     ?   Left eye: No discharge.  ?Cardiovascular:  ?   Rate and Rhythm: Normal rate and regular rhythm.  ?   Heart sounds: No murmur heard. ?  No friction rub. No gallop.  ?Pulmonary:  ?   Effort: Pulmonary effort is normal.  ?   Breath sounds: Normal breath sounds.  ?Abdominal:  ?   General: Bowel sounds are normal.  ?   Palpations: Abdomen is soft.  ?   Comments: Tenderness to palpation of the left flank, without rebound, rigidity, guarding.  Does not radiate into the back, negative CVA tenderness bilaterally.  ?Skin: ?   General: Skin is warm and dry.  ?   Capillary Refill: Capillary refill takes less than 2 seconds.  ?Neurological:  ?  Mental Status: She is alert and oriented to person, place, and time.  ?Psychiatric:     ?   Mood and Affect: Mood normal.     ?   Behavior: Behavior normal.  ? ? ?ED Results / Procedures / Treatments   ?Labs ?(all labs ordered are listed, but only abnormal results are displayed) ?Labs Reviewed  ?CBC - Abnormal; Notable for the following components:  ?    Result Value  ? Hemoglobin 11.9 (*)   ? All other components within normal limits  ?WET PREP, GENITAL  ?URINALYSIS, ROUTINE W REFLEX MICROSCOPIC  ?PREGNANCY, URINE  ?COMPREHENSIVE METABOLIC PANEL  ?LIPASE, BLOOD  ?GC/CHLAMYDIA PROBE AMP ([place]) NOT AT [facility]  ? ? ?EKG ?None ? ?Radiology ?CT Renal Stone Study ? ?Result Date: 12/03/2021 ?CLINICAL DATA:  Flank  pain.  Kidney stone suspected. EXAM: CT ABDOMEN AND PELVIS WITHOUT CONTRAST TECHNIQUE: Multidetector CT imaging of the abdomen and pelvis was performed following the standard protocol without IV contrast. RADIATION DOSE REDUCTION: This exam was performed according to the departmental dose-optimization program which includes automated exposure control, adjustment of the mA and/or kV according to patient size and/or use of iterative reconstruction technique. COMPARISON:  CT abdomen pelvis 07/29/2021 FINDINGS: Lower chest: Lung bases are clear.  No pleural effusions. Hepatobiliary: Normal appearance of the liver and gallbladder. Pancreas: Unremarkable. No pancreatic ductal dilatation or surrounding inflammatory changes. Spleen: Normal in size without focal abnormality. Adrenals/Urinary Tract: Normal adrenal glands. Negative for kidney stones or hydronephrosis. Normal appearance of the urinary bladder. Stomach/Bowel: Moderate amount of stool in the rectum and distal colon. Normal appearance of stomach. No evidence for bowel obstruction or focal bowel inflammation. Vascular/Lymphatic: No significant vascular findings are present. No enlarged abdominal or pelvic lymph nodes. Reproductive: Uterus and bilateral adnexa are unremarkable. Other: Negative for free fluid.  Negative for free air. Musculoskeletal: No acute bone abnormality. Again noted is surgical hardware in the right iliac bone. Probable old fracture of the L1 right transverse process. IMPRESSION: 1. No acute abnormality in the abdomen or pelvis. Specifically, no evidence for kidney stones or hydronephrosis. Electronically Signed   By: Markus Daft M.D.   On: 12/03/2021 15:54   ? ?Procedures ?Procedures  ? ? ?Medications Ordered in ED ?Medications  ?ondansetron (ZOFRAN) injection 4 mg (4 mg Intravenous Given 12/03/21 1539)  ?ketorolac (TORADOL) 15 MG/ML injection 15 mg (15 mg Intravenous Given 12/03/21 1538)  ?acetaminophen (TYLENOL) tablet 650 mg (650 mg Oral Given  12/03/21 1535)  ?ondansetron (ZOFRAN) injection 4 mg (4 mg Intravenous Given 12/03/21 1644)  ?morphine (PF) 4 MG/ML injection 4 mg (4 mg Intravenous Given 12/03/21 1800)  ? ? ?ED Course/ Medical Decision Making/ A&P ?  ?                        ?Medical Decision Making ?Amount and/or Complexity of Data Reviewed ?Labs: ordered. ?Radiology: ordered. ? ?Risk ?OTC drugs. ?Prescription drug management. ? ? ?This patient presents to the ED for concern of pelvic pain, vaginal discharge, left flank pain, this involves an extensive number of treatment options, and is a complaint that carries with it a high risk of complications and morbidity. The emergent differential diagnosis prior to evaluation includes, but is not limited to, pyelonephritis, nephrolithiasis, STI, tubo-ovarian abscess, bacterial vaginosis, or other pelvic abnormality, pain secondary to endometriosis, fibroids, intermittent torsion, other acute intra-abdominal abnormality including diverticulitis, acute mesenteric ischemia, normal UTI, versus other.  ? ?This is not an exhaustive differential.  ? ?  Past Medical History / Co-morbidities / Social History: ?History of anxiety, recurrent bacterial vaginosis ? ?Additional history: ?Chart reviewed. Pertinent results include: Reviewed lab work and imaging from previous emergency department visit ? ?Physical Exam: ?Physical exam performed. The pertinent findings include: Tenderness palpation of the left flank, as well as bilateral adnexa on pelvic exam.  Some whitish clearish discharge on pelvic exam.  No rebound, rigidity, guarding. ? ?Lab Tests: ?I ordered, and personally interpreted labs.  The pertinent results include: Unremarkable CBC, CMP, mild anemia with hemoglobin of 11.9.  Wet prep shows no yeast, trichomonas, clue cells.  Her lipase is negative at 21.  She is not pregnant.  Urinalysis is unremarkable, including no signs of urinary tract infection. ?  ?Imaging Studies: ?I ordered imaging studies including CT  stone study. I independently visualized and interpreted imaging which showed no evidence of kidney stone or other intrathoracic abnormality, given patient's ongoing pain discussed and think it be reasonab

## 2021-12-03 NOTE — ED Triage Notes (Signed)
Pelvic pain 3 days, she has recurrent bacterial infections,in process of moving ob so she hasnt been treated recently. Discharge is thick white.  ?

## 2021-12-04 LAB — GC/CHLAMYDIA PROBE AMP (~~LOC~~) NOT AT ARMC
Chlamydia: NEGATIVE
Comment: NEGATIVE
Comment: NORMAL
Neisseria Gonorrhea: NEGATIVE

## 2022-01-07 ENCOUNTER — Other Ambulatory Visit: Payer: Self-pay

## 2022-01-07 ENCOUNTER — Ambulatory Visit (INDEPENDENT_AMBULATORY_CARE_PROVIDER_SITE_OTHER): Payer: BC Managed Care – PPO

## 2022-01-07 ENCOUNTER — Ambulatory Visit (INDEPENDENT_AMBULATORY_CARE_PROVIDER_SITE_OTHER): Payer: BC Managed Care – PPO | Admitting: Orthopaedic Surgery

## 2022-01-07 DIAGNOSIS — M25551 Pain in right hip: Secondary | ICD-10-CM | POA: Diagnosis not present

## 2022-01-07 DIAGNOSIS — G8929 Other chronic pain: Secondary | ICD-10-CM

## 2022-01-07 NOTE — Progress Notes (Signed)
The patient is a very pleasant 26 year old who works within the [employer] and has a complicated history as it relates to her right hip.  She had had PAO surgery with the periacetabular osteotomy around May 2021.  She eventually sought treatment at [facility] and in January of last year underwent right hip arthroscopy with a labral repair.  Since she now lives in [place] she is appropriately seeking further treatment as it relates to her right hip here in [place].  She does report occasional low back pain to the right side and sciatic pain the right side but also occasional pain in the right groin area.  She has been through physical therapy.  She would like to get an opinion as it relates to how she is doing with her right hip.  We did obtain x-rays today of the right hip.  She has a CT scan of her abdomen pelvis from January of this year due to abdominal pain and unable to see her right hip on that scan as well.  She is otherwise a healthy 26 year old who does not take any significant medications and is otherwise having no active medical issues.  She walks with a normal gait as well.  Her right hip moves smoothly and fluidly with no blocks to rotation.  There is some mild pain with rotation of that hip.  An AP pelvis and lateral of the right hip shows well-maintained joint space within the right hip and left hip.  There is retained hardware in the right ilium as a relates to previous surgery only pelvis and acetabulum.  The CT scan of her abdomen pelvis shows the right hip and the joint space is also well-maintained with no complicating features.  I do feel it is reasonable to obtain a MRI arthrogram of the right hip to assess the labral repair and assess the cartilage of her hip in order to come up with further treatment plan as it relates to her right hip and the pain that she experiences.  She does note occasional popping in the hip as well.  We will see her back once we have this MRI.  She agrees with  this treatment plan.

## 2022-01-30 ENCOUNTER — Ambulatory Visit
Admission: RE | Admit: 2022-01-30 | Discharge: 2022-01-30 | Disposition: A | Discharge status: Home / Self Care | Payer: BC Managed Care – PPO | Source: Ambulatory Visit | Attending: Orthopaedic Surgery | Admitting: Orthopaedic Surgery

## 2022-01-30 DIAGNOSIS — G8929 Other chronic pain: Secondary | ICD-10-CM

## 2022-01-30 MED ORDER — IOPAMIDOL (ISOVUE-M 200) INJECTION 41%
13.0000 mL | Freq: Once | INTRAMUSCULAR | Status: AC
  Administered 2022-01-30: 13 mL via INTRA_ARTICULAR

## 2022-02-05 ENCOUNTER — Ambulatory Visit (INDEPENDENT_AMBULATORY_CARE_PROVIDER_SITE_OTHER): Payer: BC Managed Care – PPO | Admitting: Orthopaedic Surgery

## 2022-02-05 DIAGNOSIS — G8929 Other chronic pain: Secondary | ICD-10-CM

## 2022-02-05 DIAGNOSIS — M25551 Pain in right hip: Secondary | ICD-10-CM | POA: Diagnosis not present

## 2022-02-05 NOTE — Progress Notes (Signed)
The patient is a 26 year old who has a history of surgery on her right pelvis and hip area.  A year ago she had arthroscopic intervention by a surgeon at [facility].  She was told that she may eventually need a hip replacement by the age of 71.  She does get pain with activities of her right hip.  We sent her for an MRI to assess the cartilage.  She does have some hardware higher in the pelvis and this did show some scatter but I can see the MRI and it does not look like there is significant arthritic changes thus far all of the femoral head or acetabulum.  There is some slight signal changes in the labrum but not specific.  I did share with her and her mother the results of the MRI.  I do not think she is in need yet of any type of joint replacement surgery.  Her plain films do not show that either.  I certainly have seen patients that have arthroscopic interventions that go on to hip replacements but thus far her hip appears stable.  I did give her the name of a arthroscopic hip specialist at [facility] (Dr. Merleen Nicely).  He is someone who could always see there is another opinion as well.  Follow-up from my standpoint is as needed.

## 2022-02-08 ENCOUNTER — Emergency Department (HOSPITAL_COMMUNITY)
Admission: EM | Admit: 2022-02-08 | Discharge: 2022-02-08 | Disposition: A | Discharge status: Home / Self Care | Payer: BC Managed Care – PPO | Attending: Emergency Medicine | Admitting: Emergency Medicine

## 2022-02-08 ENCOUNTER — Other Ambulatory Visit: Payer: Self-pay

## 2022-02-08 ENCOUNTER — Encounter (HOSPITAL_COMMUNITY): Payer: Self-pay

## 2022-02-08 DIAGNOSIS — R000 Tachycardia, unspecified: Secondary | ICD-10-CM | POA: Diagnosis not present

## 2022-02-08 DIAGNOSIS — F10920 Alcohol use, unspecified with intoxication, uncomplicated: Secondary | ICD-10-CM | POA: Insufficient documentation

## 2022-02-08 DIAGNOSIS — Z91010 Allergy to peanuts: Secondary | ICD-10-CM | POA: Insufficient documentation

## 2022-02-08 DIAGNOSIS — E876 Hypokalemia: Secondary | ICD-10-CM | POA: Insufficient documentation

## 2022-02-08 DIAGNOSIS — Z789 Other specified health status: Secondary | ICD-10-CM

## 2022-02-08 DIAGNOSIS — Y906 Blood alcohol level of 120-199 mg/100 ml: Secondary | ICD-10-CM | POA: Diagnosis not present

## 2022-02-08 DIAGNOSIS — R451 Restlessness and agitation: Secondary | ICD-10-CM | POA: Insufficient documentation

## 2022-02-08 LAB — RAPID URINE DRUG SCREEN, HOSP PERFORMED
Amphetamines: NOT DETECTED
Barbiturates: NOT DETECTED
Benzodiazepines: POSITIVE — AB
Cocaine: NOT DETECTED
Opiates: NOT DETECTED
Tetrahydrocannabinol: NOT DETECTED

## 2022-02-08 LAB — CBC WITH DIFFERENTIAL/PLATELET
Abs Immature Granulocytes: 0.01 K/uL (ref 0.00–0.07)
Basophils Absolute: 0.1 K/uL (ref 0.0–0.1)
Basophils Relative: 1 %
Eosinophils Absolute: 0.4 K/uL (ref 0.0–0.5)
Eosinophils Relative: 5 %
HCT: 34.8 % — ABNORMAL LOW (ref 36.0–46.0)
Hemoglobin: 11.5 g/dL — ABNORMAL LOW (ref 12.0–15.0)
Immature Granulocytes: 0 %
Lymphocytes Relative: 35 %
Lymphs Abs: 2.7 K/uL (ref 0.7–4.0)
MCH: 28.5 pg (ref 26.0–34.0)
MCHC: 33 g/dL (ref 30.0–36.0)
MCV: 86.1 fL (ref 80.0–100.0)
Monocytes Absolute: 0.4 K/uL (ref 0.1–1.0)
Monocytes Relative: 6 %
Neutro Abs: 4.1 K/uL (ref 1.7–7.7)
Neutrophils Relative %: 53 %
Platelets: 262 K/uL (ref 150–400)
RBC: 4.04 MIL/uL (ref 3.87–5.11)
RDW: 14.6 % (ref 11.5–15.5)
WBC: 7.6 K/uL (ref 4.0–10.5)
nRBC: 0 % (ref 0.0–0.2)

## 2022-02-08 LAB — COMPREHENSIVE METABOLIC PANEL WITH GFR
ALT: 24 U/L (ref 0–44)
AST: 22 U/L (ref 15–41)
Albumin: 4.1 g/dL (ref 3.5–5.0)
Alkaline Phosphatase: 72 U/L (ref 38–126)
Anion gap: 11 (ref 5–15)
BUN: 11 mg/dL (ref 6–20)
CO2: 21 mmol/L — ABNORMAL LOW (ref 22–32)
Calcium: 8.9 mg/dL (ref 8.9–10.3)
Chloride: 109 mmol/L (ref 98–111)
Creatinine, Ser: 0.82 mg/dL (ref 0.44–1.00)
GFR, Estimated: >60 mL/min (ref >60)
Glucose, Bld: 95 mg/dL (ref 70–99)
Potassium: 3.2 mmol/L — ABNORMAL LOW (ref 3.5–5.1)
Sodium: 141 mmol/L (ref 135–145)
Total Bilirubin: 0.2 mg/dL — ABNORMAL LOW (ref 0.3–1.2)
Total Protein: 7.5 g/dL (ref 6.5–8.1)

## 2022-02-08 LAB — ETHANOL: Alcohol, Ethyl (B): 170 mg/dL — ABNORMAL HIGH (ref <10)

## 2022-02-08 LAB — CBG MONITORING, ED: Glucose-Capillary: 104 mg/dL — ABNORMAL HIGH (ref 70–99)

## 2022-02-08 LAB — I-STAT BETA HCG BLOOD, ED (MC, WL, AP ONLY): I-stat hCG, quantitative: <5 m[IU]/mL (ref <5)

## 2022-02-08 LAB — ACETAMINOPHEN LEVEL: Acetaminophen (Tylenol), Serum: <10 ug/mL — ABNORMAL LOW (ref 10–30)

## 2022-02-08 LAB — SALICYLATE LEVEL: Salicylate Lvl: <7 mg/dL — ABNORMAL LOW (ref 7.0–30.0)

## 2022-02-08 MED ORDER — POTASSIUM CHLORIDE CRYS ER 20 MEQ PO TBCR
40.0000 meq | EXTENDED_RELEASE_TABLET | Freq: Once | ORAL | Status: AC
Start: 2022-02-08 — End: 2022-02-08
  Administered 2022-02-08: 40 meq via ORAL
  Filled 2022-02-08: qty 2

## 2022-02-08 MED ORDER — SODIUM CHLORIDE 0.9 % IV BOLUS
1000.0000 mL | Freq: Once | INTRAVENOUS | Status: AC
  Administered 2022-02-08: 1000 mL via INTRAVENOUS

## 2022-02-08 NOTE — ED Provider Notes (Signed)
[facility] DEPT Provider Note   CSN: [id number] Arrival date & time: 02/08/22  0306     History  Chief Complaint  Patient presents with   Alcohol Problem    Alisha Reynolds is a 26 y.o. female with a hx of anemia, anxiety, and seizures who presents to the emergency department via EMS due to concern for possible seizure activity with alcohol intoxication.  Per EMS they received a call the patient was having seizure activity after drinking heavily.  On their arrival she is somewhat flailing, moving all of her extremities, however seem purposeful.  She was agitated, they gave Haldol and Versed in route.  She reportedly has a history of similar.  Patient currently not answering questions, level 5 caveat applies secondary to altered mental status.  HPI     Home Medications Prior to Admission medications   Medication Sig Start Date End Date Taking? Authorizing Provider  sertraline (ZOLOFT) 50 MG tablet Take 50 mg by mouth daily. 02/03/22  Yes [provider]  cyclobenzaprine (FLEXERIL) 10 MG tablet Take 1 tablet (10 mg total) by mouth every 8 (eight) hours as needed for muscle spasms. Patient not taking: Reported on 02/08/2022 04/30/21   Sanjuana Kava, MD  Gabapentin, Once-Daily, 300 MG TABS Take 1 tablet by mouth 2 (two) times daily as needed (severe pain). Patient not taking: Reported on 02/08/2022 04/30/21   Sanjuana Kava, MD  ibuprofen (ADVIL) 800 MG tablet Take 1 tablet (800 mg total) by mouth every 8 (eight) hours as needed for cramping or moderate pain. Patient not taking: Reported on 02/08/2022 04/30/21   Sanjuana Kava, MD  promethazine (PHENERGAN) 12.5 MG tablet Take 1 tablet (12.5 mg total) by mouth every 6 (six) hours as needed for nausea or vomiting. Patient not taking: Reported on 02/08/2022 04/17/21   Palumbo, April, MD      Allergies    Other, Peanut-containing drug products, Oxycodone, Tramadol, and Pork-derived products    Review of Systems    Review of Systems  Unable to perform ROS: Mental status change    Physical Exam Updated Vital Signs BP 121/70   Pulse 96   Temp 97.6 F (36.4 C) (Axillary)   Resp (!) 21   LMP 02/24/2021   SpO2 96%  Physical Exam Vitals and nursing note reviewed.  Constitutional:      General: She is not in acute distress.    Appearance: She is not ill-appearing or toxic-appearing.  HENT:     Head: Normocephalic and atraumatic. No raccoon eyes or Battle's sign.  Eyes:     Pupils: Pupils are equal, round, and reactive to light.  Cardiovascular:     Rate and Rhythm: Regular rhythm. Tachycardia present.     Pulses: Normal pulses.  Pulmonary:     Effort: Pulmonary effort is normal.     Breath sounds: Normal breath sounds.  Abdominal:     General: There is no distension.     Palpations: Abdomen is soft.     Tenderness: There is no abdominal tenderness.  Musculoskeletal:     Cervical back: Neck supple. No rigidity.     Comments: No focal bony tenderness.  Neurological:     Comments: Not following commands.  Protecting airway.  Some purposeful/intentional movements during exam.     ED Results / Procedures / Treatments   Labs (all labs ordered are listed, but only abnormal results are displayed) Labs Reviewed  COMPREHENSIVE METABOLIC PANEL - Abnormal; Notable for the following components:  Result Value   Potassium 3.2 (*)    CO2 21 (*)    Total Bilirubin 0.2 (*)    All other components within normal limits  ETHANOL - Abnormal; Notable for the following components:   Alcohol, Ethyl (B) 170 (*)    All other components within normal limits  CBC WITH DIFFERENTIAL/PLATELET - Abnormal; Notable for the following components:   Hemoglobin 11.5 (*)    HCT 34.8 (*)    All other components within normal limits  SALICYLATE LEVEL - Abnormal; Notable for the following components:   Salicylate Lvl <0.1 (*)    All other components within normal limits  ACETAMINOPHEN LEVEL - Abnormal; Notable  for the following components:   Acetaminophen (Tylenol), Serum <10 (*)    All other components within normal limits  CBG MONITORING, ED - Abnormal; Notable for the following components:   Glucose-Capillary 104 (*)    All other components within normal limits  RAPID URINE DRUG SCREEN, HOSP PERFORMED  I-STAT BETA HCG BLOOD, ED ([facility], [facility], [facility] ONLY)    EKG EKG Interpretation  Date/Time:  Sunday February 08 2022 03:16:35 EDT Ventricular Rate:  111 PR Interval:  144 QRS Duration: 103 QT Interval:  346 QTC Calculation: 471 R Axis:   63 Text Interpretation: Sinus tachycardia Borderline Q waves in inferior leads Confirmed by Gerlene Fee [phone]) on 02/08/2022 3:18:38 AM  Radiology No results found.  Procedures Procedures    Medications Ordered in ED Medications  potassium chloride SA (KLOR-CON M) CR tablet 40 mEq (has no administration in time range)  sodium chloride 0.9 % bolus 1,000 mL (1,000 mLs Intravenous New Bag/Given 02/08/22 0332)  sodium chloride 0.9 % bolus 1,000 mL (1,000 mLs Intravenous New Bag/Given 02/08/22 0533)    ED Course/ Medical Decision Making/ A&P                           Medical Decision Making Amount and/or Complexity of Data Reviewed Labs: ordered.  Risk Prescription drug management.   Patient presents to the ED for evaluation of EtOH and possible seizure activity, this involves an extensive number of treatment options, and is a complaint that carries with it a high risk of complications and morbidity. Nontoxic, vitals w/ mild tachycardia.   Patient not following commands status post Versed and Haldol, however she is having some purposeful movements during exam.  No seizure activity noted.  Additional history obtained:  Discussion with EMS for additional history.  Shortly following patient arrival her friend arrived at the bedside who witnessed events of the evening, he reports that patient was drinking heavily, she became upset about missing her baby, she  subsequently began crying and then "blacked out" and started to have seizure activity.  He states this happens when she gets upset.  IT has happened previously when drinking with an emotional event.  Patient now awake states lasting she remembers is drinking alcohol and celebrating her birthday.  She has no complaints at this time other than that she feels tired.  External records viewed including: Admission in October 2022 for [facility] for retained products of conception, had possible seizure activity following procedure, neurology was consulted, CT head was performed without acute cranial process and reassuring EEG.  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   Patient with history of similar, no seizure activity witnessed by EMS when bystanders felt she was seizing, do not feel that head CT is necessary at this time, no reports of  head injury.  EKG: Sinus tachycardia Borderline Q waves in inferior leads  Lab Tests:  I viewed & interpreted labs including:  CBC, CMP, ethanol level, acetaminophen level, salicylate level, pregnancy test: Fairly unremarkable, alcohol intoxication, mild hypokalemia which will be orally replaced.  Patient with improved mentation, now answering questions and following commands without focal neurologic deficit, she still feels very tired and was bit unsteady on her feet, will continue to observe in the emergency department.  06:30: Patient care signed out to [place] @ shift change pending re-assessment & disposition.   Portions of this note were generated with Lobby[employer]. Dictation errors may occur despite best attempts at proofreading.   Final Clinical Impression(s) / ED Diagnoses Final diagnoses:  Alcohol use    Rx / DC Orders ED Discharge Orders     None         Amaryllis Dyke, PA-C 02/08/22 [id number]    Maudie Flakes, MD 02/12/22 [phone]

## 2022-02-08 NOTE — ED Notes (Signed)
Pt attempted to provide urine specimen but was unable to provide a sample.

## 2022-02-08 NOTE — ED Provider Notes (Signed)
Pt's care assumed at 6:30 am.  Pt reexamined at 9:30 am.  Pt awake.  She reports she feels better.  Vitals stable  Labs reviewed     Sidney Ace 02/08/22 3317    Lacretia Leigh, MD 02/09/22 1433

## 2022-02-08 NOTE — ED Triage Notes (Signed)
Pt BIB by [facility] from home.EMS reports pt partying.and drinking. Ems was called for seizures. Pt was not seizing and received haldol and versed to calm pt down

## 2022-02-08 NOTE — Discharge Instructions (Addendum)
Your potassium was mildly low in the ER, please include potassium rich foods in your diet, have this rechecked by your primary care provider.  Follow-up primary care soon as possible.  Return to the ER for any new or worsening symptoms or any other concerns.

## 2022-03-01 ENCOUNTER — Emergency Department (HOSPITAL_COMMUNITY)
Admission: EM | Admit: 2022-03-01 | Discharge: 2022-03-02 | Disposition: A | Discharge status: Home / Self Care | Payer: No Typology Code available for payment source | Attending: Emergency Medicine | Admitting: Emergency Medicine

## 2022-03-01 DIAGNOSIS — R519 Headache, unspecified: Secondary | ICD-10-CM | POA: Insufficient documentation

## 2022-03-01 DIAGNOSIS — Y92410 Unspecified street and highway as the place of occurrence of the external cause: Secondary | ICD-10-CM | POA: Diagnosis not present

## 2022-03-01 DIAGNOSIS — J45909 Unspecified asthma, uncomplicated: Secondary | ICD-10-CM | POA: Diagnosis not present

## 2022-03-01 DIAGNOSIS — M25551 Pain in right hip: Secondary | ICD-10-CM | POA: Insufficient documentation

## 2022-03-02 ENCOUNTER — Encounter (HOSPITAL_COMMUNITY): Payer: Self-pay | Admitting: *Deleted

## 2022-03-02 ENCOUNTER — Emergency Department (HOSPITAL_COMMUNITY): Payer: No Typology Code available for payment source

## 2022-03-02 ENCOUNTER — Other Ambulatory Visit: Payer: Self-pay

## 2022-03-02 LAB — CBC
HCT: 37.1 % (ref 36.0–46.0)
Hemoglobin: 12.2 g/dL (ref 12.0–15.0)
MCH: 28.8 pg (ref 26.0–34.0)
MCHC: 32.9 g/dL (ref 30.0–36.0)
MCV: 87.5 fL (ref 80.0–100.0)
Platelets: 309 10*3/uL (ref 150–400)
RBC: 4.24 MIL/uL (ref 3.87–5.11)
RDW: 13.9 % (ref 11.5–15.5)
WBC: 10.1 10*3/uL (ref 4.0–10.5)
nRBC: 0 % (ref 0.0–0.2)

## 2022-03-02 LAB — BASIC METABOLIC PANEL
Anion gap: 8 (ref 5–15)
BUN: 11 mg/dL (ref 6–20)
CO2: 26 mmol/L (ref 22–32)
Calcium: 9.5 mg/dL (ref 8.9–10.3)
Chloride: 103 mmol/L (ref 98–111)
Creatinine, Ser: 0.87 mg/dL (ref 0.44–1.00)
GFR, Estimated: >60 mL/min (ref >60)
Glucose, Bld: 92 mg/dL (ref 70–99)
Potassium: 3.5 mmol/L (ref 3.5–5.1)
Sodium: 137 mmol/L (ref 135–145)

## 2022-03-02 LAB — ETHANOL: Alcohol, Ethyl (B): <10 mg/dL (ref <10)

## 2022-03-02 MED ORDER — ACETAMINOPHEN 500 MG PO TABS
1000.0000 mg | ORAL_TABLET | Freq: Once | ORAL | Status: AC
  Administered 2022-03-02: 1000 mg via ORAL
  Filled 2022-03-02: qty 2
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
## 2022-03-02 NOTE — Discharge Instructions (Addendum)
You were evaluated in the Emergency Department and after careful evaluation, we did not find any emergent condition requiring admission or further testing in the hospital.  Your exam/testing today was overall reassuring.  Recommend follow-up with neurology to discuss your symptoms.  Until then recommend that you do not drive or swim by yourself or anything else that would be dangerous if you had another spell.  Please return to the Emergency Department if you experience any worsening of your condition.  Thank you for allowing [place] to be a part of your care.

## 2022-03-05 ENCOUNTER — Emergency Department (HOSPITAL_BASED_OUTPATIENT_CLINIC_OR_DEPARTMENT_OTHER): Payer: Self-pay

## 2022-03-05 ENCOUNTER — Encounter (HOSPITAL_COMMUNITY): Payer: Self-pay

## 2022-03-05 ENCOUNTER — Other Ambulatory Visit: Payer: Self-pay

## 2022-03-05 ENCOUNTER — Emergency Department (HOSPITAL_BASED_OUTPATIENT_CLINIC_OR_DEPARTMENT_OTHER)
Admission: EM | Admit: 2022-03-05 | Discharge: 2022-03-05 | Disposition: A | Discharge status: Home / Self Care | Payer: Self-pay | Attending: Emergency Medicine | Admitting: Emergency Medicine

## 2022-03-05 ENCOUNTER — Encounter (HOSPITAL_BASED_OUTPATIENT_CLINIC_OR_DEPARTMENT_OTHER): Payer: Self-pay

## 2022-03-05 DIAGNOSIS — R1084 Generalized abdominal pain: Secondary | ICD-10-CM | POA: Insufficient documentation

## 2022-03-05 DIAGNOSIS — Z91010 Allergy to peanuts: Secondary | ICD-10-CM | POA: Insufficient documentation

## 2022-03-05 DIAGNOSIS — R112 Nausea with vomiting, unspecified: Secondary | ICD-10-CM | POA: Insufficient documentation

## 2022-03-05 DIAGNOSIS — N9489 Other specified conditions associated with female genital organs and menstrual cycle: Secondary | ICD-10-CM | POA: Insufficient documentation

## 2022-03-05 DIAGNOSIS — R109 Unspecified abdominal pain: Secondary | ICD-10-CM | POA: Diagnosis present

## 2022-03-05 LAB — COMPREHENSIVE METABOLIC PANEL
ALT: 24 U/L (ref 0–44)
AST: 19 U/L (ref 15–41)
Albumin: 4.7 g/dL (ref 3.5–5.0)
Alkaline Phosphatase: 90 U/L (ref 38–126)
Anion gap: 17 — ABNORMAL HIGH (ref 5–15)
BUN: 14 mg/dL (ref 6–20)
CO2: 19 mmol/L — ABNORMAL LOW (ref 22–32)
Calcium: 10.3 mg/dL (ref 8.9–10.3)
Chloride: 102 mmol/L (ref 98–111)
Creatinine, Ser: 0.9 mg/dL (ref 0.44–1.00)
GFR, Estimated: >60 mL/min (ref >60)
Glucose, Bld: 89 mg/dL (ref 70–99)
Potassium: 3.3 mmol/L — ABNORMAL LOW (ref 3.5–5.1)
Sodium: 138 mmol/L (ref 135–145)
Total Bilirubin: 0.3 mg/dL (ref 0.3–1.2)
Total Protein: 8.3 g/dL — ABNORMAL HIGH (ref 6.5–8.1)

## 2022-03-05 LAB — CBC WITH DIFFERENTIAL/PLATELET
Abs Immature Granulocytes: 0.03 10*3/uL (ref 0.00–0.07)
Basophils Absolute: 0.1 10*3/uL (ref 0.0–0.1)
Basophils Relative: 1 %
Eosinophils Absolute: 0.5 10*3/uL (ref 0.0–0.5)
Eosinophils Relative: 5 %
HCT: 39.6 % (ref 36.0–46.0)
Hemoglobin: 13.2 g/dL (ref 12.0–15.0)
Immature Granulocytes: 0 %
Lymphocytes Relative: 19 %
Lymphs Abs: 2.2 10*3/uL (ref 0.7–4.0)
MCH: 27.8 pg (ref 26.0–34.0)
MCHC: 33.3 g/dL (ref 30.0–36.0)
MCV: 83.4 fL (ref 80.0–100.0)
Monocytes Absolute: 0.5 10*3/uL (ref 0.1–1.0)
Monocytes Relative: 5 %
Neutro Abs: 8.1 10*3/uL — ABNORMAL HIGH (ref 1.7–7.7)
Neutrophils Relative %: 70 %
Platelets: 349 10*3/uL (ref 150–400)
RBC: 4.75 MIL/uL (ref 3.87–5.11)
RDW: 13.3 % (ref 11.5–15.5)
WBC: 11.4 10*3/uL — ABNORMAL HIGH (ref 4.0–10.5)
nRBC: 0 % (ref 0.0–0.2)

## 2022-03-05 LAB — HCG, SERUM, QUALITATIVE: Preg, Serum: NEGATIVE

## 2022-03-05 LAB — LIPASE, BLOOD: Lipase: 12 U/L (ref 11–51)

## 2022-03-05 MED ORDER — POLYETHYLENE GLYCOL 3350 17 G PO PACK
17.0000 g | PACK | Freq: Once | ORAL | Status: AC
  Administered 2022-03-05: 17 g via ORAL
  Filled 2022-03-05: qty 1

## 2022-03-05 MED ORDER — SODIUM CHLORIDE 0.9 % IV BOLUS
1000.0000 mL | Freq: Once | INTRAVENOUS | Status: AC
  Administered 2022-03-05: 1000 mL via INTRAVENOUS

## 2022-03-05 MED ORDER — HALOPERIDOL LACTATE 5 MG/ML IJ SOLN
5.0000 mg | Freq: Once | INTRAMUSCULAR | Status: DC
  Filled 2022-03-05: qty 1

## 2022-03-05 MED ORDER — LORAZEPAM 2 MG/ML IJ SOLN: 1.0000 mg | Freq: Once | INTRAMUSCULAR | Status: DC

## 2022-03-05 MED ORDER — ONDANSETRON HCL 4 MG/2ML IJ SOLN
4.0000 mg | Freq: Once | INTRAMUSCULAR | Status: AC
  Administered 2022-03-05: 4 mg via INTRAVENOUS
  Filled 2022-03-05: qty 2

## 2022-03-05 MED ORDER — FENTANYL CITRATE PF 50 MCG/ML IJ SOSY
50.0000 ug | PREFILLED_SYRINGE | Freq: Once | INTRAMUSCULAR | Status: AC
  Administered 2022-03-05: 50 ug via INTRAVENOUS
  Filled 2022-03-05: qty 1

## 2022-03-05 MED ORDER — DOCUSATE SODIUM 100 MG PO CAPS
100.0000 mg | ORAL_CAPSULE | Freq: Once | ORAL | Status: AC
  Administered 2022-03-05: 100 mg via ORAL
  Filled 2022-03-05: qty 1

## 2022-03-05 MED ORDER — IOHEXOL 300 MG/ML  SOLN
100.0000 mL | Freq: Once | INTRAMUSCULAR | Status: AC | PRN
  Administered 2022-03-05: 80 mL via INTRAVENOUS

## 2022-03-05 MED ORDER — FENTANYL CITRATE PF 50 MCG/ML IJ SOSY
100.0000 ug | PREFILLED_SYRINGE | Freq: Once | INTRAMUSCULAR | Status: AC
  Administered 2022-03-05: 100 ug via INTRAVENOUS
  Filled 2022-03-05: qty 2

## 2022-03-05 MED ORDER — PANTOPRAZOLE SODIUM 40 MG IV SOLR
40.0000 mg | Freq: Once | INTRAVENOUS | Status: AC
Start: 2022-03-05 — End: 2022-03-05
  Administered 2022-03-05: 40 mg via INTRAVENOUS
  Filled 2022-03-05: qty 10

## 2022-03-05 NOTE — ED Notes (Signed)
Reviewed AVS/discharge instruction with patient. Reviewed constipation management, including walking, diet and medications. Patient plans to f/u with surgeon and verbalized s/s of emergency and when to return to ED. Time allotted for and all questions answered. Patient is agreeable for d/c and escorted to ed exit by staff.

## 2022-03-05 NOTE — ED Triage Notes (Signed)
Patient here POV from Home.  Endorses ABD Pain with Sudden Acute Onset that began approximately 2 Hours ago. History of Recurrent ABD Pain due to Surgery Approximately 2 Years ago.    Moderate Nausea. No Diarrhea or Emesis.   Uncomfortable during Triage. A&Ox4. GCS 15. BIB Wheelchair.
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
## 2022-03-05 NOTE — ED Notes (Signed)
Patient became defensive and paranoid about what medications were being administer.  "Is that really Zofran, you  aren't trying to give me something else"  Each medication was explained and patient observed administration.  Patient was reassured that it is her right to be informed about any medications given to her and encouraged her to ask questions and be an advocate.  Patient appeared calmer and was more relaxed after medication and therapeutic communication with nursing.
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
## 2022-04-29 DIAGNOSIS — L853 Xerosis cutis: Secondary | ICD-10-CM | POA: Diagnosis not present

## 2022-04-29 DIAGNOSIS — R55 Syncope and collapse: Secondary | ICD-10-CM | POA: Diagnosis not present

## 2022-05-04 ENCOUNTER — Ambulatory Visit: Payer: 59 | Attending: Family Medicine

## 2022-05-04 ENCOUNTER — Other Ambulatory Visit: Payer: Self-pay | Admitting: *Deleted

## 2022-05-04 DIAGNOSIS — R55 Syncope and collapse: Secondary | ICD-10-CM

## 2022-05-04 NOTE — Progress Notes (Unsigned)
Enrolled for [employer] to mail a ZIO XT long term holter monitor to the patients address on file.   DOD to read. 

## 2022-05-08 DIAGNOSIS — R55 Syncope and collapse: Secondary | ICD-10-CM

## 2022-05-13 DIAGNOSIS — N898 Other specified noninflammatory disorders of vagina: Secondary | ICD-10-CM | POA: Diagnosis not present

## 2022-05-18 IMAGING — US US OB < 14 WEEKS - US OB TV
1 series · 15 of 28 positions shown · non-contrast
Comparison: Outside ultrasound unavailable for comparison. MR
abdomen 10/05/2020, pelvic ultrasound 06/30/2020

CLINICAL DATA: Pain for 1 week, outside ultrasound with reported
right CP of unknown location

EXAM:
OBSTETRIC <14 WK US AND TRANSVAGINAL OB US
TECHNIQUE: Both transabdominal and transvaginal ultrasound examinations were
performed for complete evaluation of the gestation as well as the
maternal uterus, adnexal regions, and pelvic cul-de-sac.
Transvaginal technique was performed to assess early pregnancy.

[Series 1: us ob < 14 weeks - us ob tv · 15 of 46 slices shown]
[im 1/46]
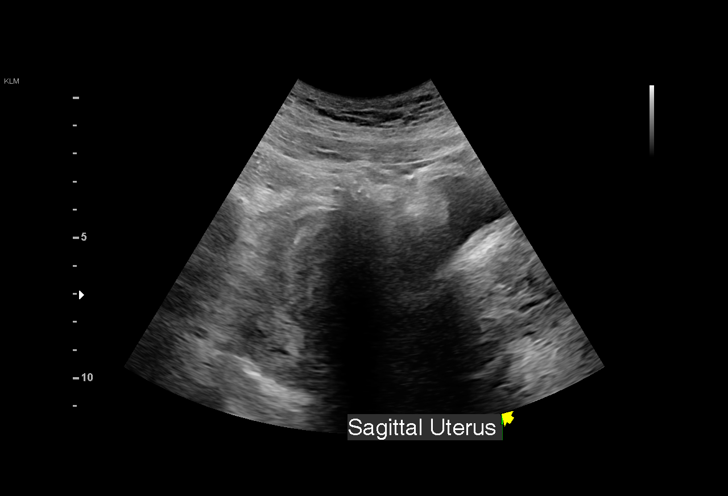
[im 4/46]
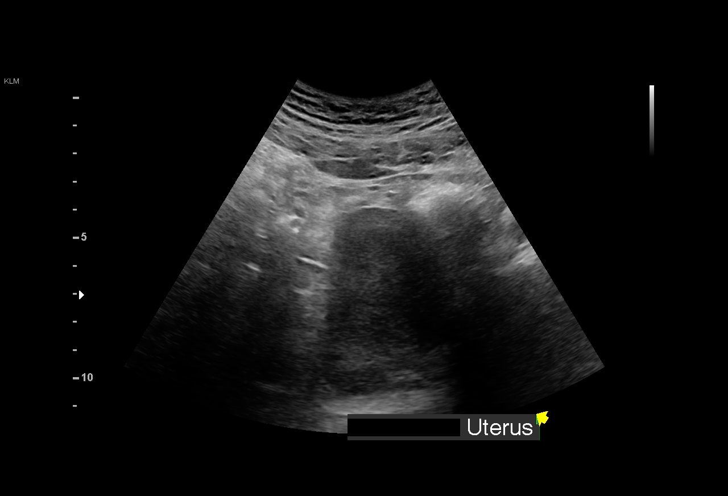
[im 7/46]
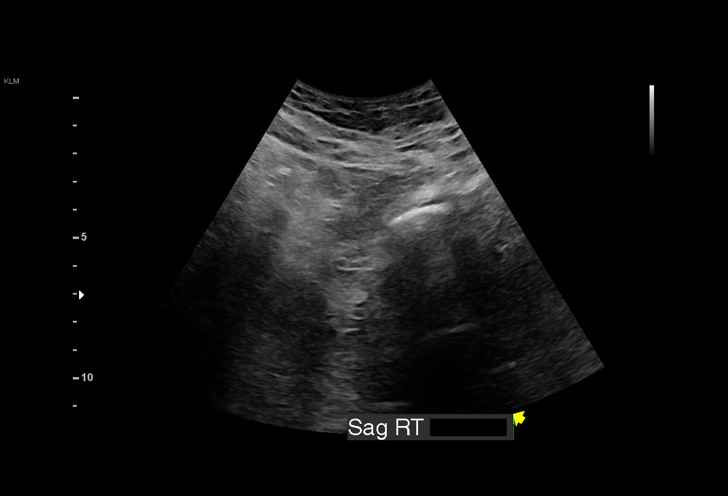
[im 11/46]
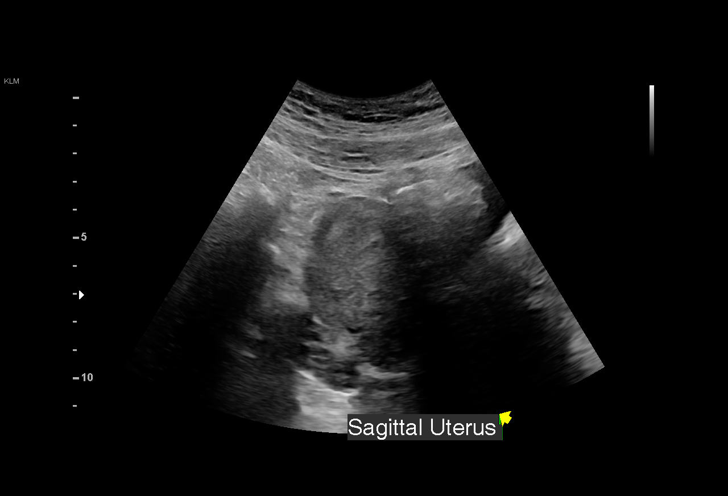
[im 14/46]
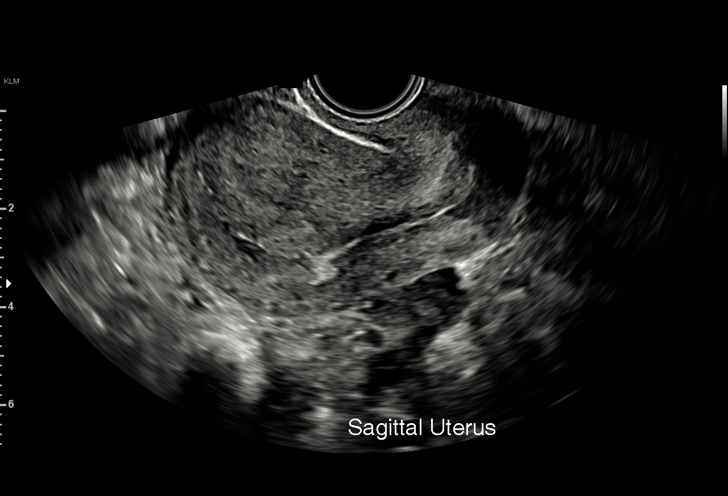
[im 17/46]
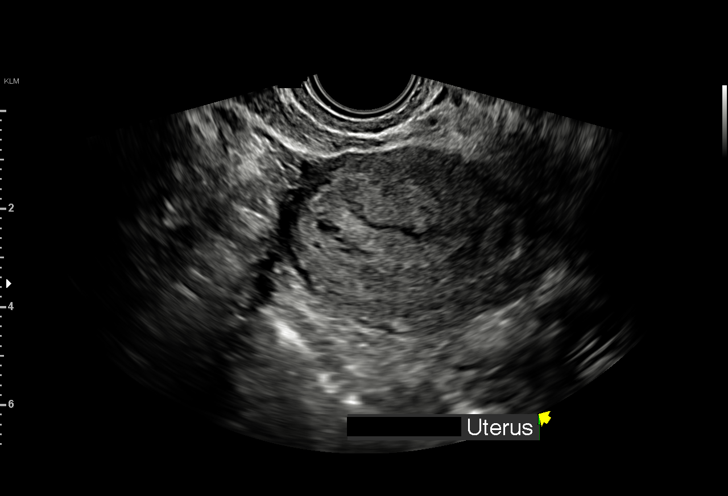
[im 21/46]
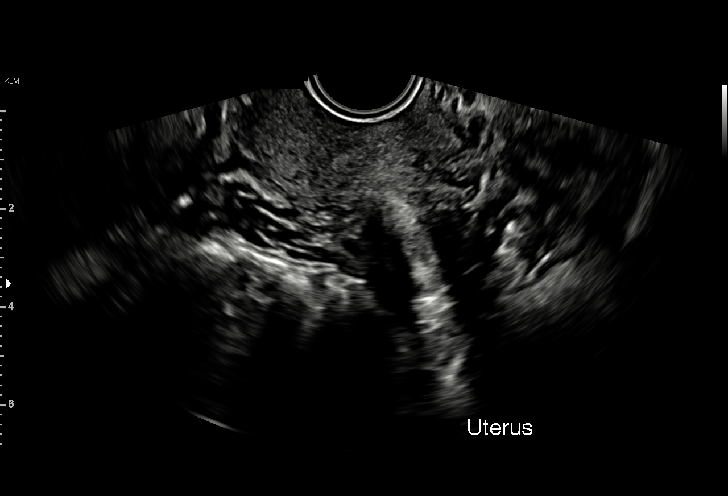
[im 24/46]
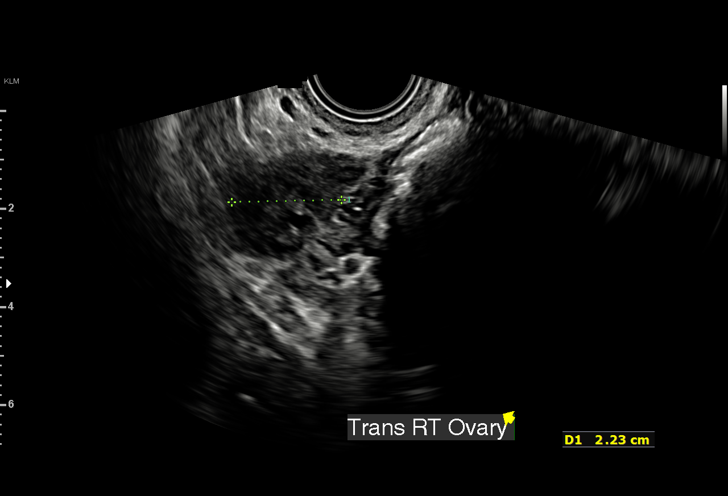
[im 26/46]
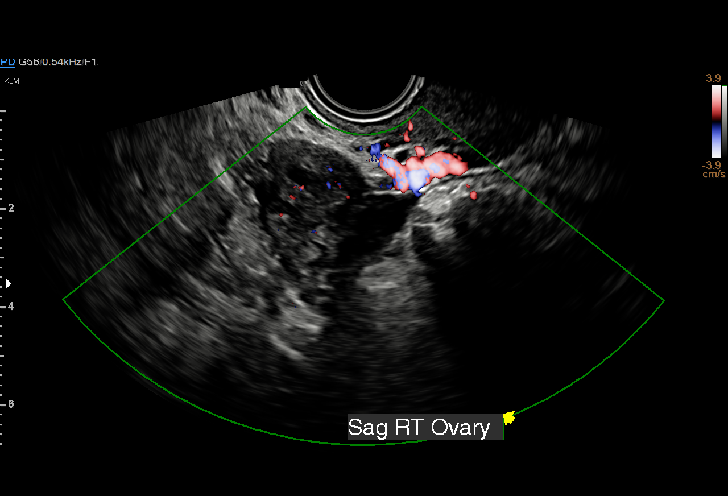
[im 29/46]
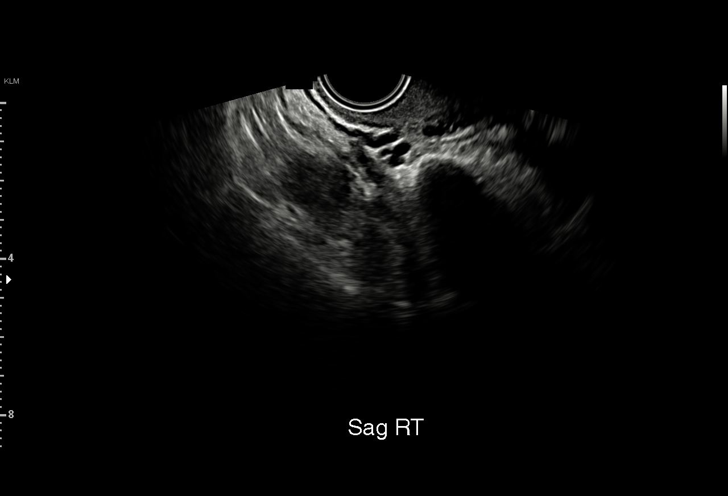
[im 32/46]
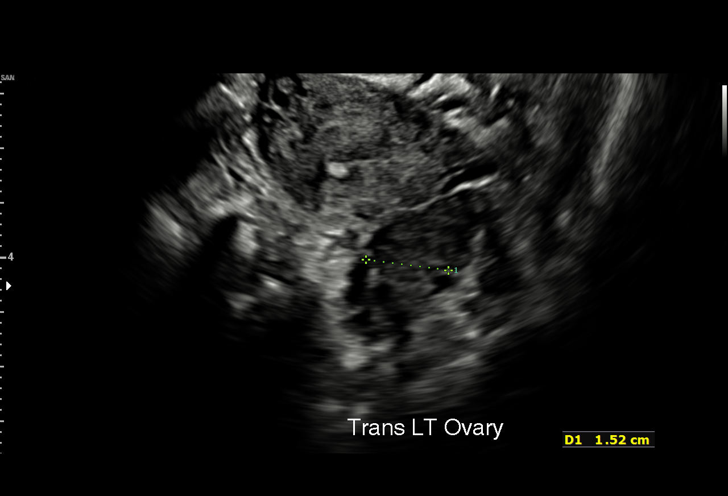
[im 36/46]
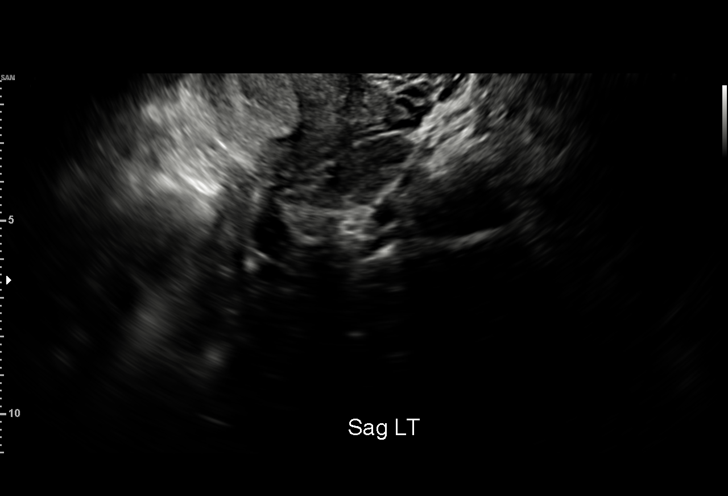
[im 39/46]
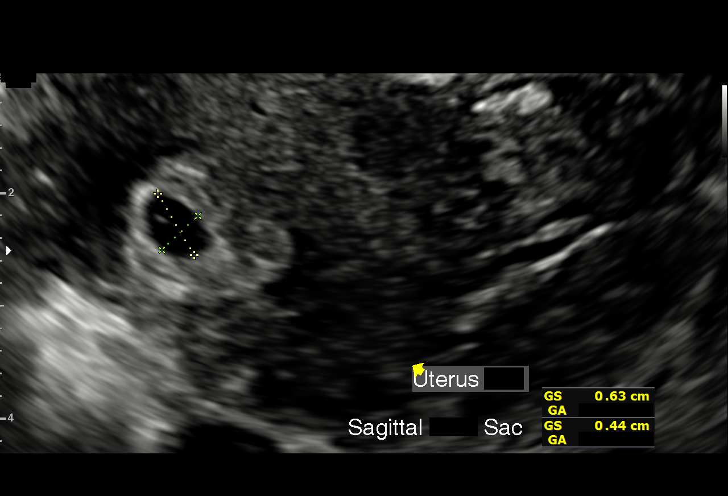
[im 42/46]
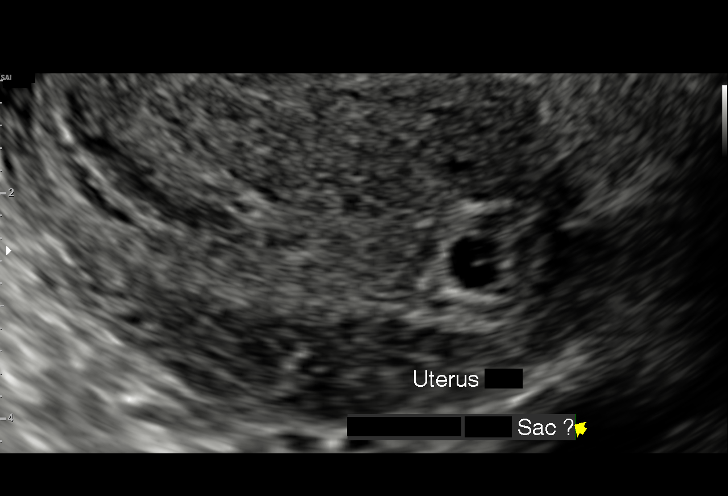
[im 46/46]
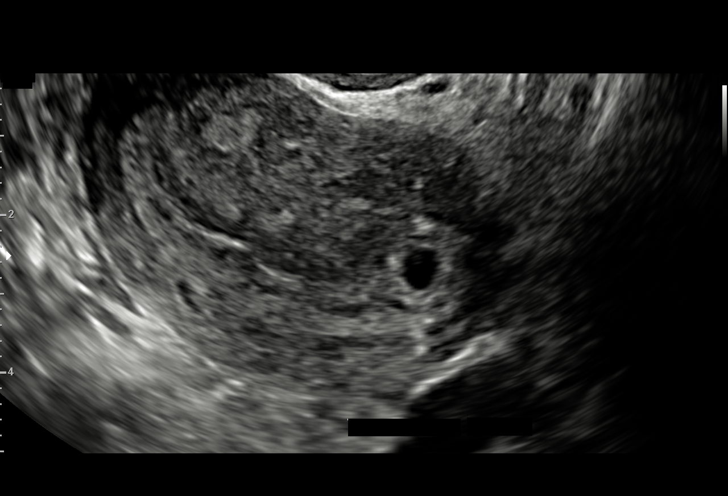

[15 of 28 positions shown; findings below may reference images not displayed]

FINDINGS: Intrauterine gestational sac: Likely gestational sac is seen quite
lateral and eccentric within the uterine cavity, towards the
vicinity of the intramural portion of the left fallopian tube with
thinning of the endomyometrial mantle.

Yolk sac: Likely yolk sac visualized within the gestational sac
itself.

Embryo:  Not Visualized.

Cardiac Activity: Not Visualized.

MSD: 5.5 mm   5 w   2 d

Subchorionic hemorrhage:  None visualized.

Maternal uterus/adnexae: Small corpus luteum in the right ovary. No
concerning adnexal lesions. No free fluid.
IMPRESSION: Likely early gestational sac with small yolk sac with eccentric
positioning towards the far left lateral aspect of the uterine
cavity, closely approximating the intramural portion of the left
fallopian tube with some likely thinning of the endo myometrial
mantle (images 42, 44). Appearance is highly conspicuous for
interstitial ectopic.

## 2022-05-20 IMAGING — US US OB TRANSVAGINAL
1 series · 15 of 28 positions shown · non-contrast
Comparison: 11/15/2020.

CLINICAL DATA: Abdominal pain and pregnancy

EXAM:
TRANSVAGINAL OB ULTRASOUND
TECHNIQUE: Transvaginal ultrasound was performed for complete evaluation of the
gestation as well as the maternal uterus, adnexal regions, and
pelvic cul-de-sac.

[Series 1: us ob transvaginal · 49 acquisitions, 15 frames shown]
[im 1/49]
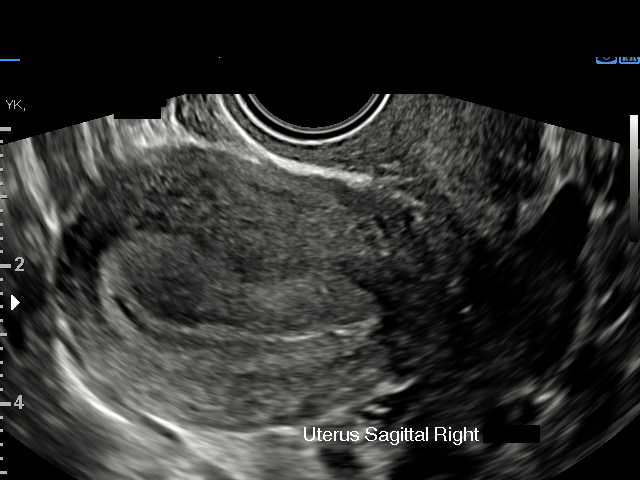
[im 4/49]
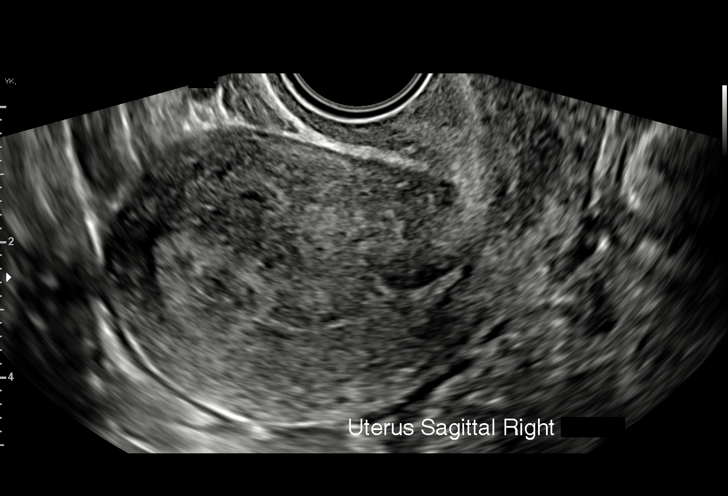
[im 8/49]
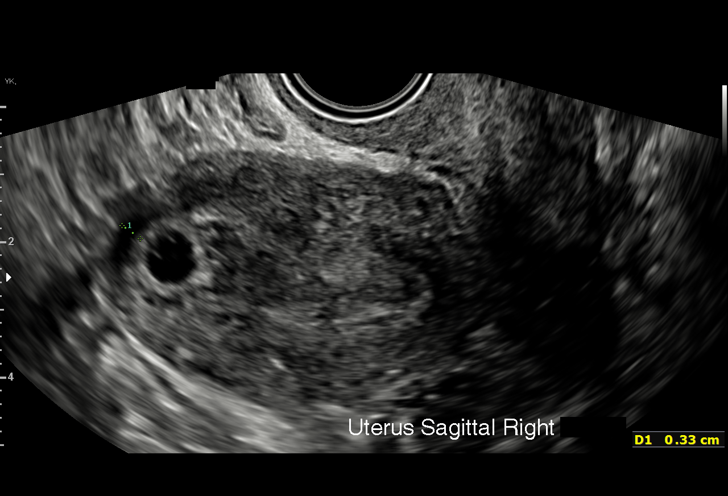
[im 11/49]
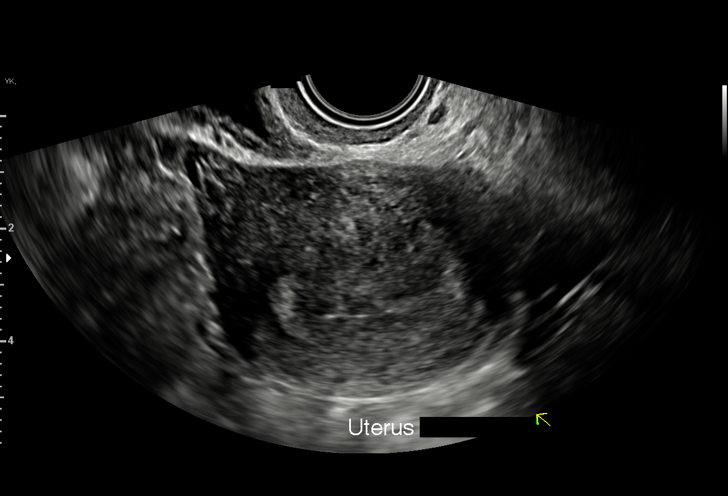
[im 15/49]
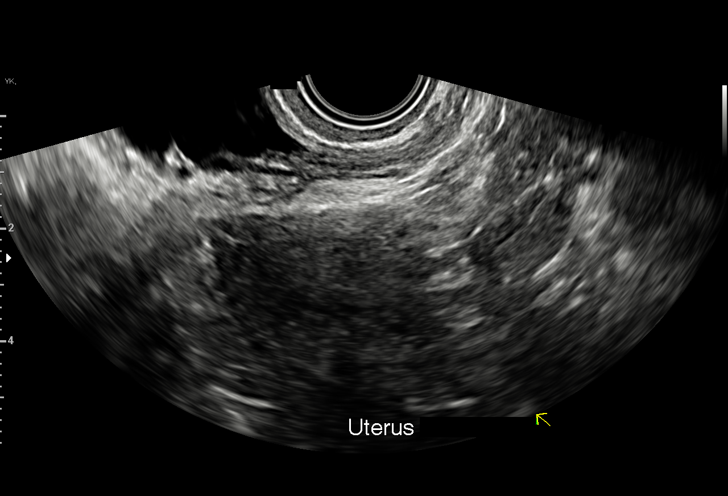
[im 18/49]
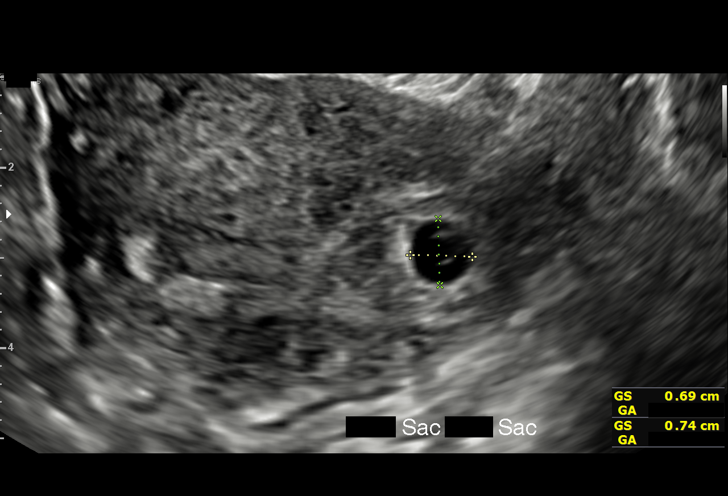
[im 22/49]
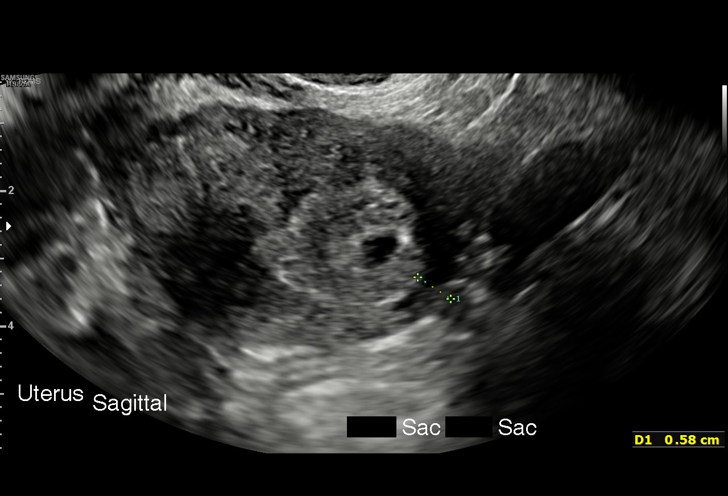
[im 25/49]
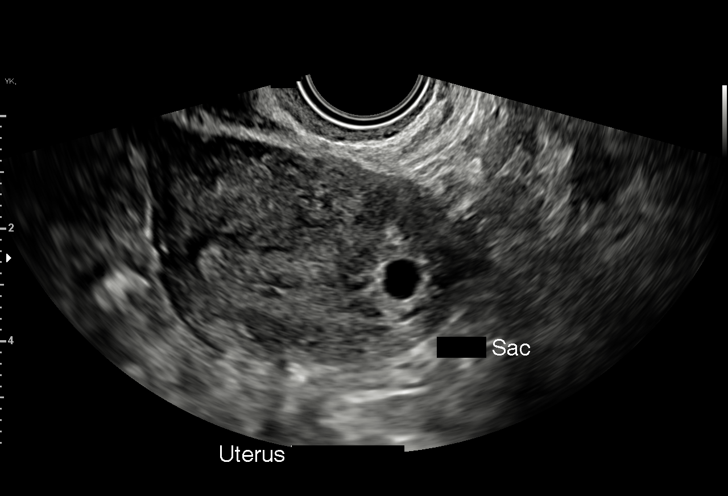
[im 27/49]
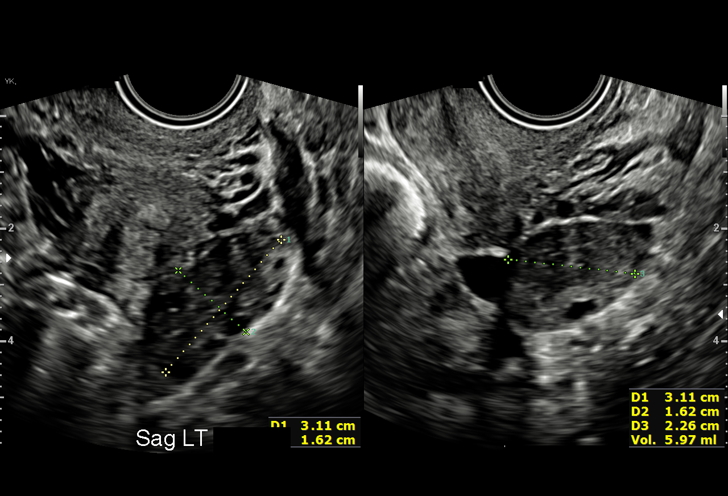
[im 31/49]
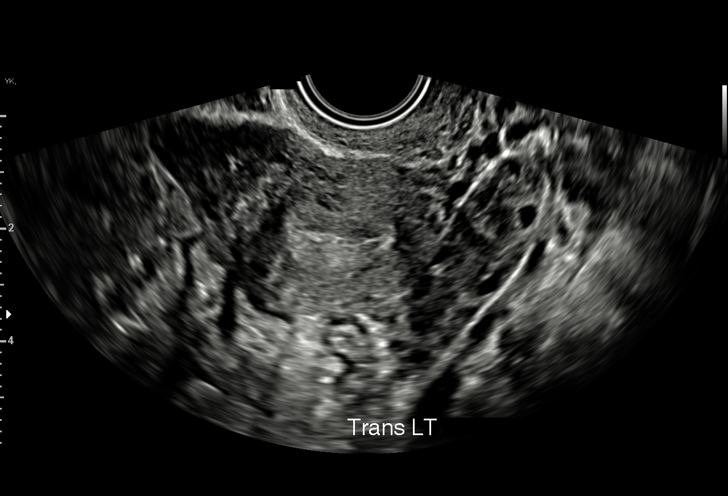
[im 34/49]
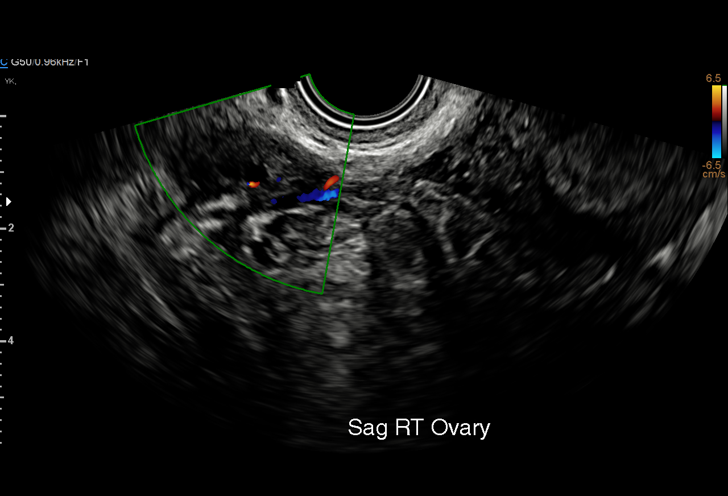
[im 38/49]
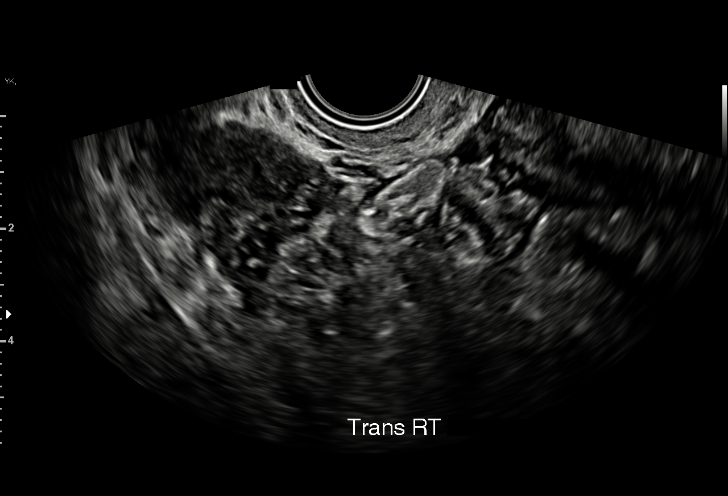
[im 41/49]
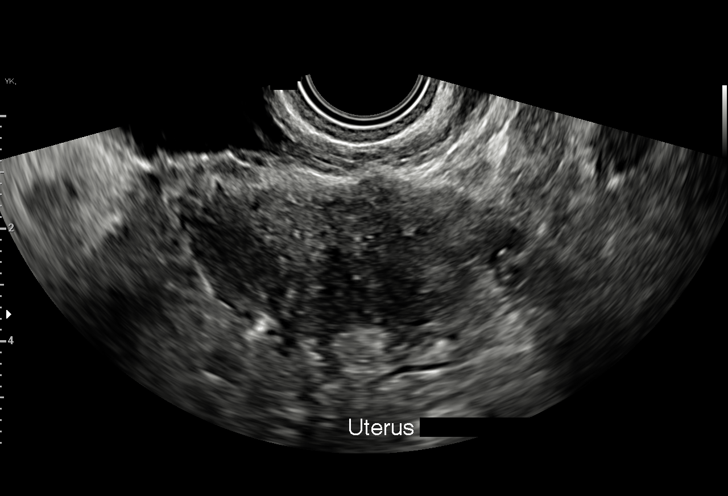
[im 45/49]
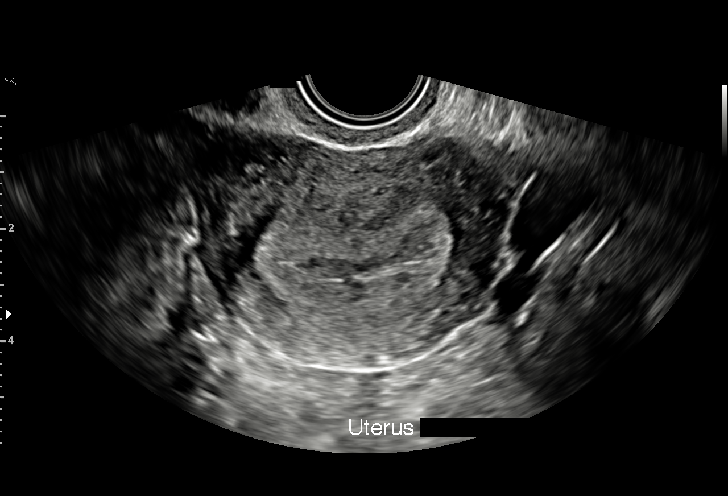
[im 49/49]
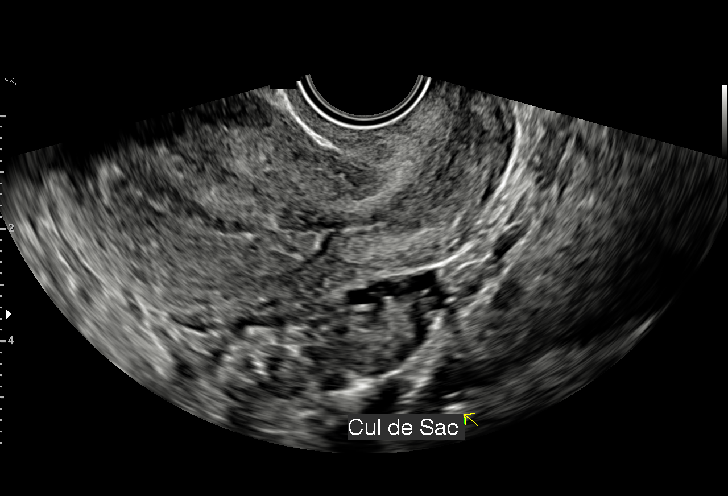

[15 of 28 positions shown; findings below may reference images not displayed]

FINDINGS: Intrauterine gestational sac: A single intrauterine gestational sac
is identified within the left lateral portion of the fundus in the
vicinity of the intramural portion of the left fallopian tube. This
is not significantly changed when compared with exam from
11/15/2020.

Yolk sac:  Visualized

Embryo:  Not visualize

MSD: 8 mm mm   5 w   3  d

Subchorionic hemorrhage:  None visualized.

Maternal uterus/adnexae:

Right ovary: Normal

Left ovary: Normal

Other :None

Free fluid:  None
IMPRESSION: 1. No change from previous exam. Likely early gestational sac
containing a yolk sac is again noted within the left fundal portion
of the endometrial cavity near the intramural portion of the left
fallopian tube. Cannot rule out interstitial ectopic pregnancy. If
further imaging is clinically indicated MRI may be helpful.

## 2022-05-22 ENCOUNTER — Encounter: Payer: Self-pay | Admitting: Neurology

## 2022-05-22 ENCOUNTER — Ambulatory Visit: Payer: 59 | Admitting: Neurology

## 2022-05-22 VITALS — BP 121/74 | HR 82 | Ht 61.0 in | Wt 164.0 lb

## 2022-05-22 DIAGNOSIS — G43E01 Chronic migraine with aura, not intractable, with status migrainosus: Secondary | ICD-10-CM

## 2022-05-22 DIAGNOSIS — R55 Syncope and collapse: Secondary | ICD-10-CM

## 2022-05-22 IMAGING — US US OB TRANSVAGINAL
2 series · 15 of 28 positions shown · non-contrast
Comparison: Ultrasound 11/17/2020.

CLINICAL DATA: Pain.  Status post methotrexate treatment.

EXAM:
OBSTETRIC <14 WK US AND TRANSVAGINAL OB US
TECHNIQUE: Both transabdominal and transvaginal ultrasound examinations were
performed for complete evaluation of the gestation as well as the
maternal uterus, adnexal regions, and pelvic cul-de-sac.
Transvaginal technique was performed to assess early pregnancy.

[Series 1: us ob transvaginal · 14 of 55 slices shown (1 of 2)]
[im 1/55]
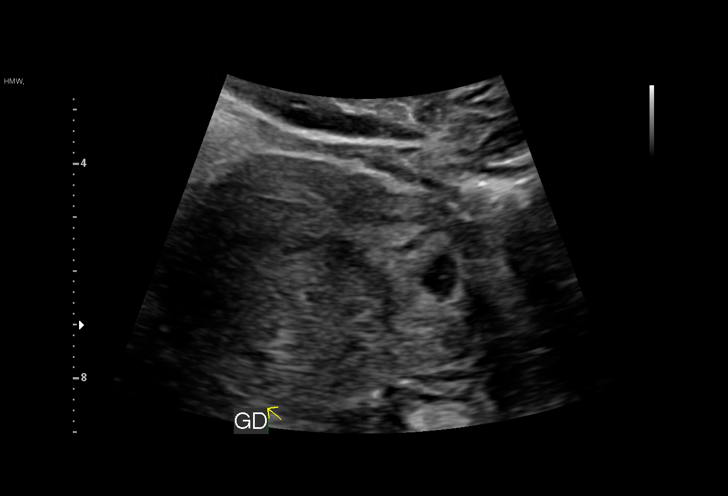
[im 5/55]
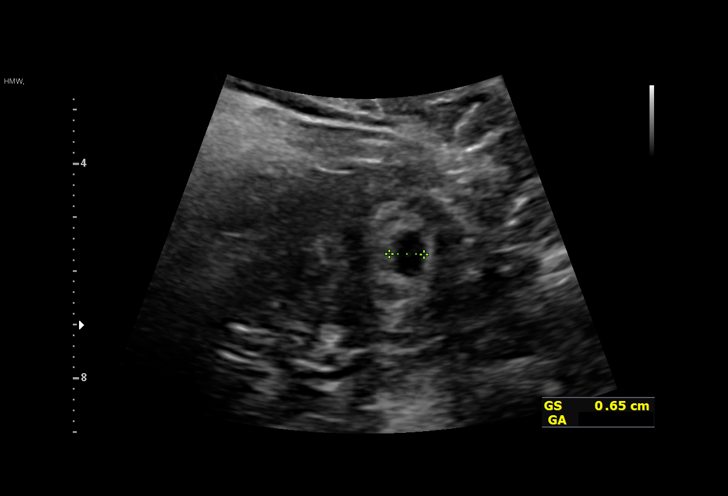
[im 9/55]
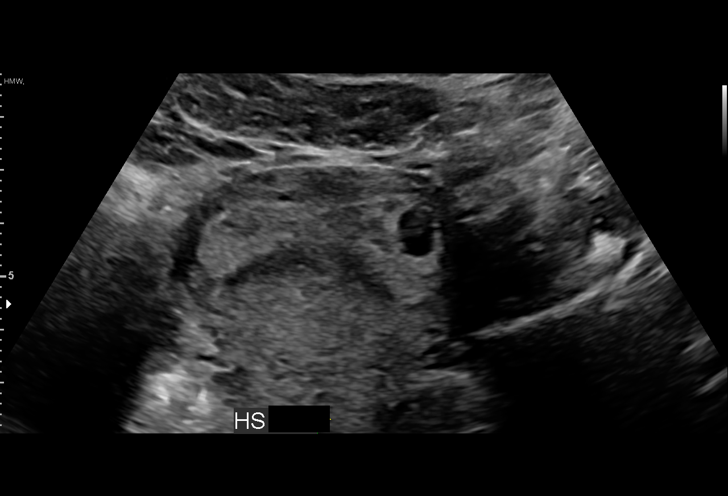
[im 13/55]
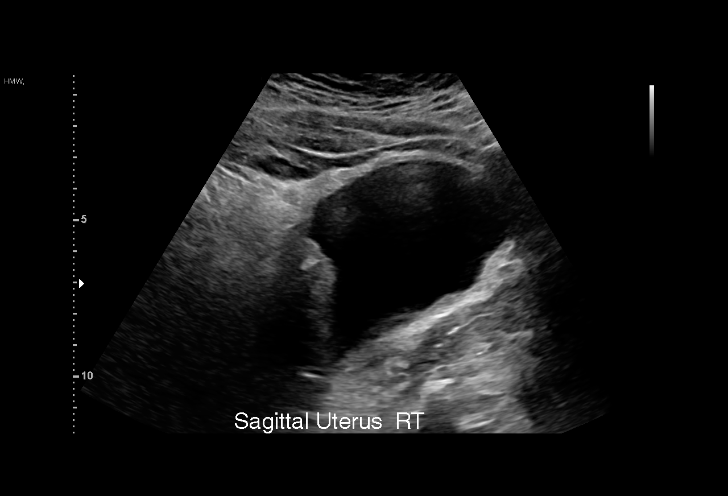
[im 17/55]
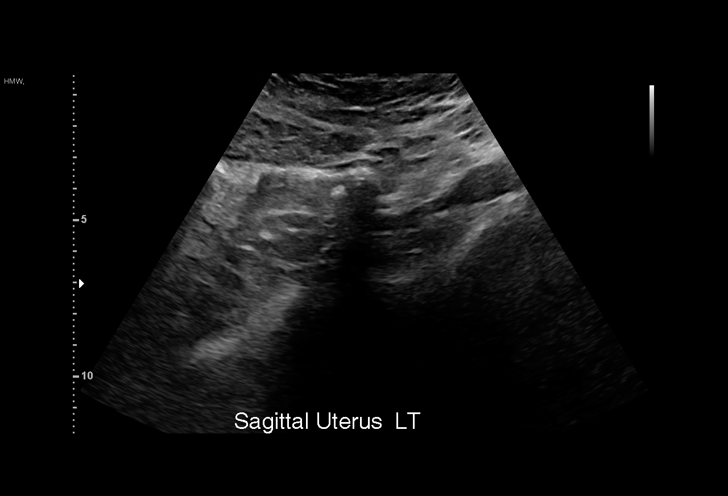
[im 21/55]
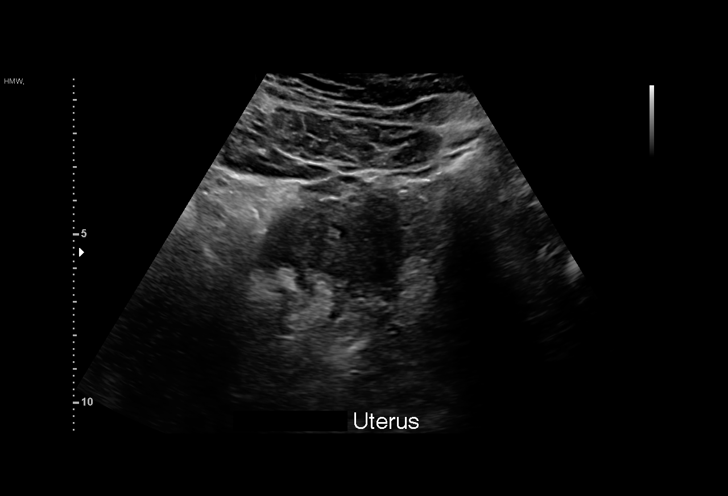
[im 25/55]
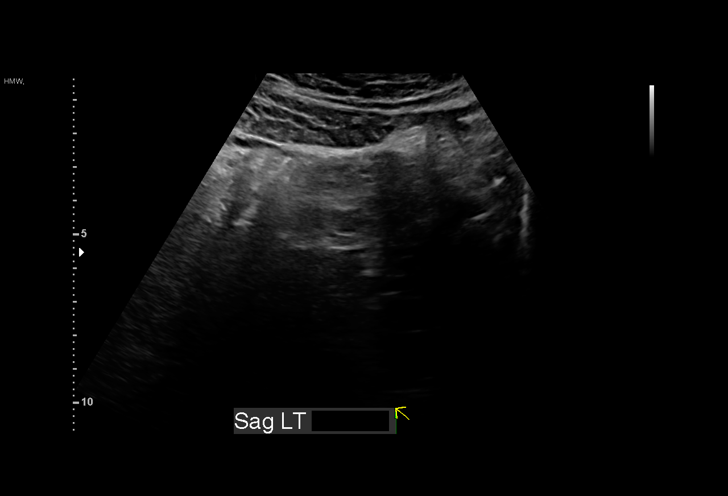
[im 30/55]
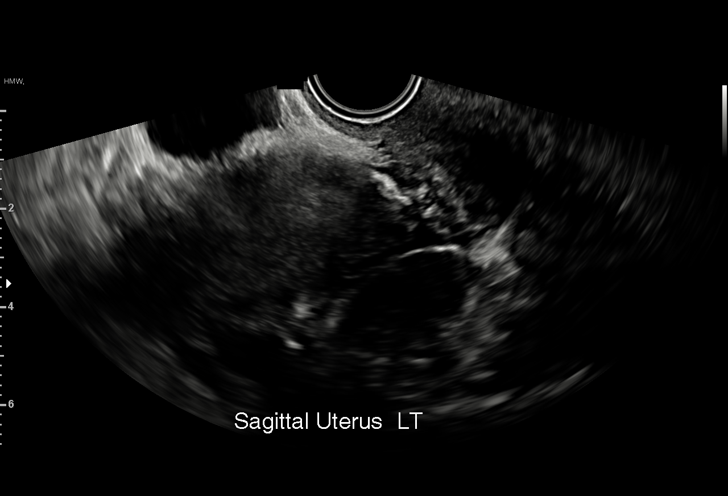
[im 32/55]
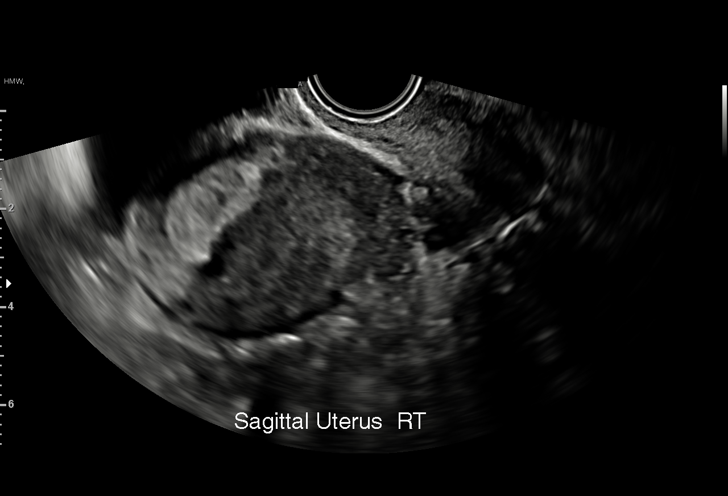
[im 36/55]
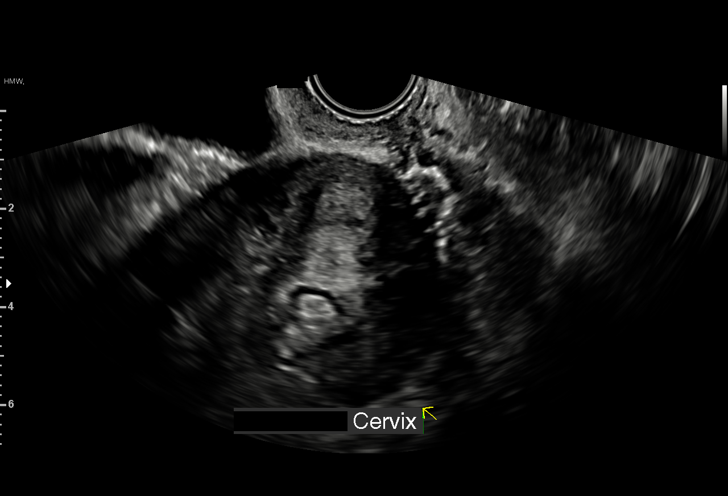
[im 40/55]
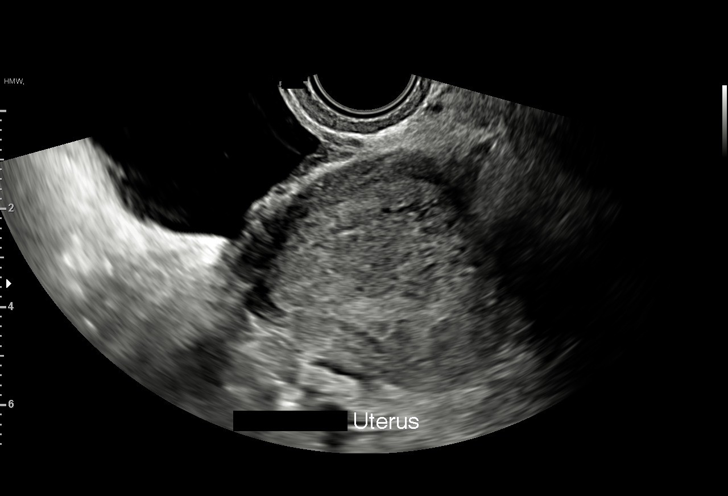
[im 44/55]
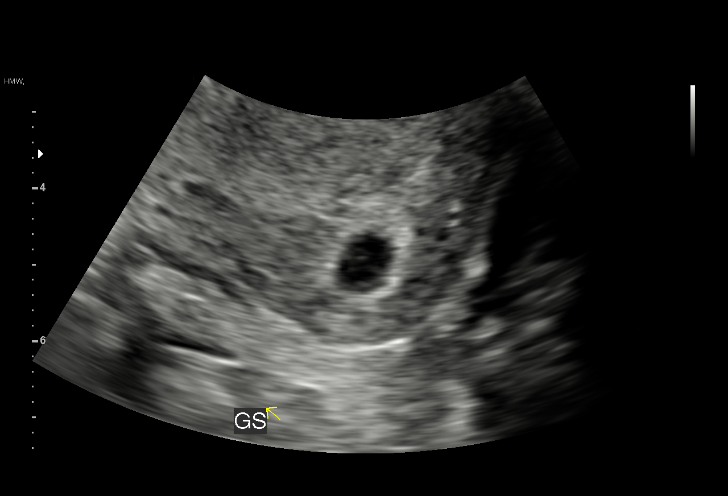
[im 48/55]
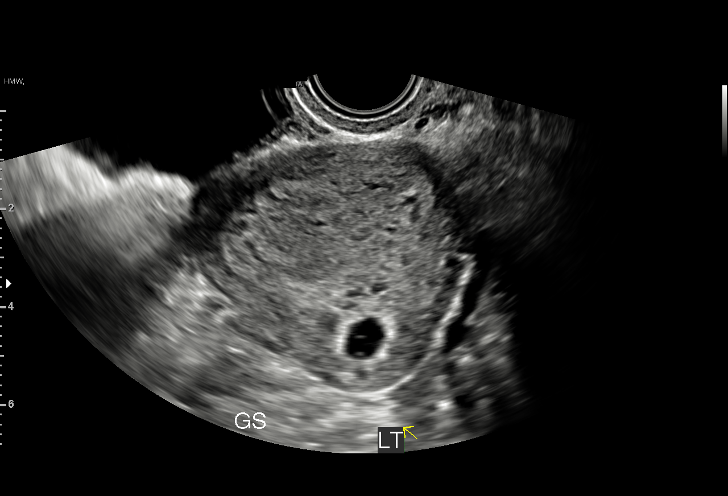
[im 52/55]
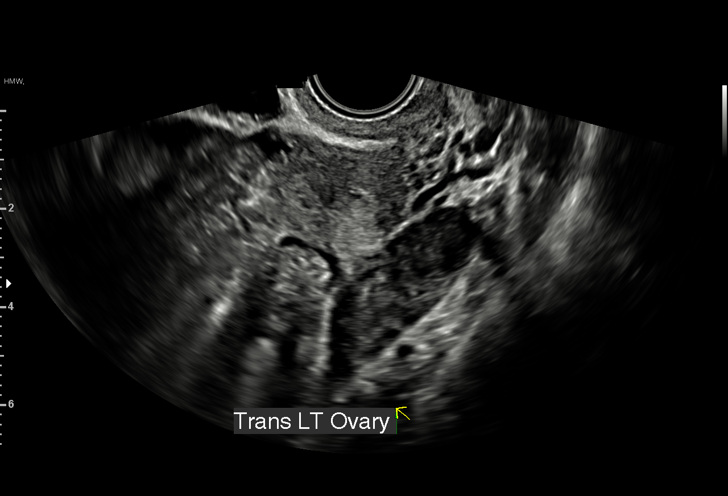

[Series 3: us ob transvaginal · 1 of 2 slices shown (2 of 2)]
[im 1/2]
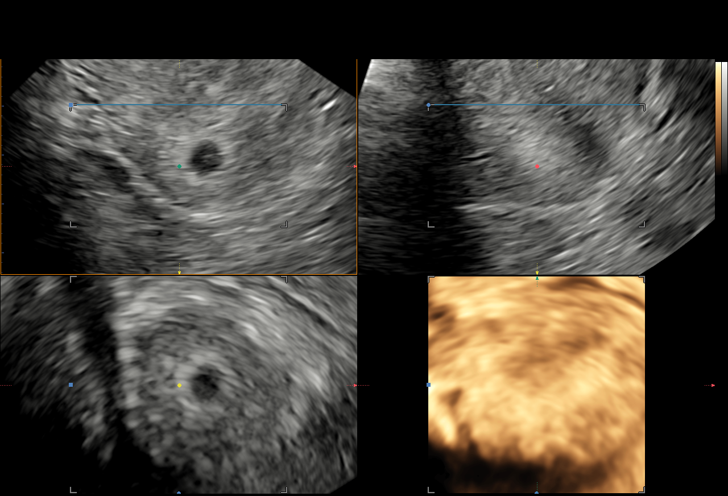

[15 of 28 positions shown; findings below may reference images not displayed]

FINDINGS: Intrauterine gestational sac: Single, again present in the left horn
of the endometrium with thinning of the endo myometrial mantle.

Yolk sac:  Present

Embryo:  None visualized

Cardiac Activity: None visualized

MSD: 5.4 mm   5 w   2 d

Subchorionic hemorrhage:  None visualized.

Maternal uterus/adnexae: Unremarkable.  No free pelvic fluid.
IMPRESSION: Again no change from prior exam. Findings consistent with early
gestational sac and yolk sac noted in the left horn of the
endometrium with thinning of the endomyometrial mantle. Again
interstitial ectopic pregnancy cannot be excluded.

## 2022-05-22 MED ORDER — AMITRIPTYLINE HCL 25 MG PO TABS: 25.0000 mg | ORAL_TABLET | Freq: Every day | ORAL | 3 refills | Status: DC

## 2022-05-22 MED ORDER — SUMATRIPTAN SUCCINATE 50 MG PO TABS: 50.0000 mg | ORAL_TABLET | ORAL | 3 refills | Status: DC | PRN

## 2022-05-22 MED ORDER — METHYLPREDNISOLONE 4 MG PO TBPK: ORAL_TABLET | ORAL | 0 refills | Status: DC
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
## 2022-05-29 DIAGNOSIS — M25551 Pain in right hip: Secondary | ICD-10-CM | POA: Diagnosis not present

## 2022-05-29 DIAGNOSIS — Z9889 Other specified postprocedural states: Secondary | ICD-10-CM | POA: Diagnosis not present

## 2022-05-29 DIAGNOSIS — M76891 Other specified enthesopathies of right lower limb, excluding foot: Secondary | ICD-10-CM | POA: Diagnosis not present

## 2022-06-01 ENCOUNTER — Other Ambulatory Visit: Payer: 59 | Admitting: *Deleted

## 2022-06-04 ENCOUNTER — Ambulatory Visit: Payer: 59 | Admitting: Neurology

## 2022-06-04 ENCOUNTER — Ambulatory Visit (HOSPITAL_COMMUNITY)
Admission: EM | Admit: 2022-06-04 | Discharge: 2022-06-04 | Disposition: A | Discharge status: Home / Self Care | Payer: 59 | Attending: Emergency Medicine | Admitting: Emergency Medicine

## 2022-06-04 DIAGNOSIS — L089 Local infection of the skin and subcutaneous tissue, unspecified: Secondary | ICD-10-CM | POA: Diagnosis not present

## 2022-06-04 DIAGNOSIS — B028 Zoster with other complications: Secondary | ICD-10-CM | POA: Diagnosis not present

## 2022-06-04 DIAGNOSIS — R55 Syncope and collapse: Secondary | ICD-10-CM | POA: Diagnosis not present

## 2022-06-04 DIAGNOSIS — K120 Recurrent oral aphthae: Secondary | ICD-10-CM

## 2022-06-04 DIAGNOSIS — R200 Anesthesia of skin: Secondary | ICD-10-CM | POA: Diagnosis not present

## 2022-06-04 MED ORDER — DIPHENHYDRAMINE HCL 12.5 MG/5ML PO LIQD
5.0000 mL | Freq: Two times a day (BID) | ORAL | 0 refills | Status: DC
Start: 2022-06-04 — End: 2022-09-24

## 2022-06-04 MED ORDER — DOXYCYCLINE HYCLATE 100 MG PO CAPS: 100.0000 mg | ORAL_CAPSULE | Freq: Two times a day (BID) | ORAL | 0 refills | Status: DC

## 2022-06-04 MED ORDER — VALACYCLOVIR HCL 500 MG PO TABS
500.0000 mg | ORAL_TABLET | Freq: Two times a day (BID) | ORAL | 0 refills | Status: DC
Start: 2022-06-04 — End: 2022-09-24

## 2022-06-04 NOTE — Discharge Instructions (Signed)
Doxycycline has been sent to the pharmacy, you will take this medication 2 times daily for the next 7 days.  Magic Mouthwash has been sent to the pharmacy, use this in the morning and at bedtime for the next 7 days.   As discussed, increase your dose of Valtrex for the next week, take 2 tablets in the morning.   Please schedule an appointment with your neurologist.
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
## 2022-06-04 NOTE — ED Triage Notes (Signed)
2-day of facial swelling. Pt reports feels like canker sores inside her mouth. Pt reports some red and itchy face.

## 2022-06-05 NOTE — Procedures (Signed)
    History:  26 year old woman with seizure vs. Syncope   EEG classification: Awake and drowsy  Description of the recording: The background rhythms of this recording consists of a fairly well modulated medium amplitude alpha rhythm of 10 Hz that is reactive to eye opening and closure. Present in the anterior head region is a 15-20 Hz beta activity. Photic stimulation was performed, did not show any abnormalities. Hyperventilation was also performed, did not show any abnormalities. Drowsiness was manifested by background fragmentation. No abnormal epileptiform discharges seen during this recording. There was no focal slowing. There were no electrographic seizure identified.   Abnormality: None   Impression: This is a normal EEG recorded while drowsy and awake. No evidence of interictal epileptiform discharges. Normal EEGs, however, do not rule out epilepsy.    Alric Ran, MD [facility]

## 2022-06-10 DIAGNOSIS — Z87898 Personal history of other specified conditions: Secondary | ICD-10-CM | POA: Diagnosis not present

## 2022-06-10 DIAGNOSIS — R21 Rash and other nonspecific skin eruption: Secondary | ICD-10-CM | POA: Diagnosis not present

## 2022-06-13 ENCOUNTER — Encounter (HOSPITAL_COMMUNITY): Payer: Self-pay | Admitting: *Deleted

## 2022-06-13 ENCOUNTER — Ambulatory Visit (HOSPITAL_COMMUNITY)
Admission: EM | Admit: 2022-06-13 | Discharge: 2022-06-13 | Disposition: A | Discharge status: Home / Self Care | Payer: 59

## 2022-06-13 ENCOUNTER — Ambulatory Visit
Admission: EM | Admit: 2022-06-13 | Discharge: 2022-06-13 | Disposition: A | Discharge status: Home / Self Care | Payer: 59

## 2022-06-13 DIAGNOSIS — H6122 Impacted cerumen, left ear: Secondary | ICD-10-CM | POA: Diagnosis not present
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
## 2022-06-13 NOTE — ED Triage Notes (Addendum)
C/O HA, left ear pain and fullness, left ear decreased hearing, dizziness onset 2 days ago; states sxs started after family member was attempting to remove cerumen from left ear with an instrument; pt and SO state cerumen got pushed farther into ear canal, now causing sxs along with tenderness to ear canal.

## 2022-06-13 NOTE — Discharge Instructions (Addendum)
Try the Debrox drops ($6 at [employer] and [employer]( Use twice daily for the next 5 days to soften wax in the left ear You can return for irrigation once softened, and we may be able to remove the rest

## 2022-06-14 ENCOUNTER — Other Ambulatory Visit: Payer: Self-pay | Admitting: Neurology

## 2022-06-21 ENCOUNTER — Emergency Department (HOSPITAL_BASED_OUTPATIENT_CLINIC_OR_DEPARTMENT_OTHER)
Admission: EM | Admit: 2022-06-21 | Discharge: 2022-06-21 | Disposition: A | Discharge status: Home / Self Care | Payer: 59 | Attending: Emergency Medicine | Admitting: Emergency Medicine

## 2022-06-21 ENCOUNTER — Encounter (HOSPITAL_BASED_OUTPATIENT_CLINIC_OR_DEPARTMENT_OTHER): Payer: Self-pay | Admitting: Emergency Medicine

## 2022-06-21 ENCOUNTER — Emergency Department (HOSPITAL_BASED_OUTPATIENT_CLINIC_OR_DEPARTMENT_OTHER): Payer: 59 | Admitting: Radiology

## 2022-06-21 ENCOUNTER — Emergency Department (HOSPITAL_BASED_OUTPATIENT_CLINIC_OR_DEPARTMENT_OTHER): Payer: 59

## 2022-06-21 DIAGNOSIS — Z91010 Allergy to peanuts: Secondary | ICD-10-CM | POA: Insufficient documentation

## 2022-06-21 DIAGNOSIS — R000 Tachycardia, unspecified: Secondary | ICD-10-CM | POA: Diagnosis not present

## 2022-06-21 DIAGNOSIS — M25551 Pain in right hip: Secondary | ICD-10-CM | POA: Diagnosis not present

## 2022-06-21 DIAGNOSIS — M79661 Pain in right lower leg: Secondary | ICD-10-CM | POA: Diagnosis not present

## 2022-06-21 DIAGNOSIS — M79651 Pain in right thigh: Secondary | ICD-10-CM | POA: Diagnosis not present

## 2022-06-21 NOTE — ED Triage Notes (Signed)
Pt here from home with c/o right upper leg pain times 2 days , hx of edema in same leg , no trauma noted

## 2022-06-21 NOTE — ED Provider Notes (Signed)
[facility] DEPT Provider Note   CSN: [id number] Arrival date & time: 06/21/22  1902     History  Chief Complaint  Patient presents with   Leg Pain    Alisha Reynolds is a 26 y.o. female.  She is here with a complaint of right inner thigh pain its been going on for 2 days.  She said it feels like a pulsing pain and is worse with ambulation.  She does not recall any trauma.  She has prior hip surgery with hardware.  She called the nursing helpline for insurance who told her she might have a blood clot and she should come to the emergency department.  No fevers or chills.  No numbness or weakness.  The history is provided by the patient.  Leg Pain Location:  Leg Time since incident:  2 days Injury: no   Leg location:  R upper leg Pain details:    Quality:  Throbbing   Severity:  Moderate   Onset quality:  Gradual   Duration:  2 days   Timing:  Intermittent   Progression:  Unchanged Chronicity:  New Dislocation: no   Worsened by:  Activity Associated symptoms: no decreased ROM, no fever, no numbness and no tingling        Home Medications Prior to Admission medications   Medication Sig Start Date End Date Taking? Authorizing Provider  amitriptyline (ELAVIL) 25 MG tablet TAKE 1 TABLET BY MOUTH EVERYDAY AT BEDTIME 06/16/22   Alric Ran, MD  doxycycline (VIBRAMYCIN) 100 MG capsule Take 1 capsule (100 mg total) by mouth 2 (two) times daily. 06/04/22   Flossie Dibble, NP  magic mouthwash (lidocaine, diphenhydrAMINE, alum & mag hydroxide) suspension Swish and spit 5 mLs in the morning and at bedtime. 06/04/22   Flossie Dibble, NP  methylPREDNISolone (MEDROL DOSEPAK) 4 MG TBPK tablet Take 6 tabs day 1, Take 5 tabs day 2, 4 tabs day 3, 3 tabs day 4, 2 tabs day 2, and 1 tab day 1 05/22/22   Alric Ran, MD  SUMAtriptan (IMITREX) 50 MG tablet Take 1 tablet (50 mg total) by mouth every 2 (two) hours as needed for migraine. May repeat in 2 hours if  headache persists or recurs. 05/22/22   Alric Ran, MD  valACYclovir (VALTREX) 500 MG tablet Take 1 tablet (500 mg total) by mouth 2 (two) times daily. Patient taking differently: Take 500 mg by mouth daily. 06/04/22   Flossie Dibble, NP      Allergies    Other, Peanut-containing drug products, Oxycodone, Tramadol, and Pork-derived products    Review of Systems   Review of Systems  Constitutional:  Negative for fever.  Respiratory:  Negative for shortness of breath.   Cardiovascular:  Negative for chest pain.  Musculoskeletal:  Negative for joint swelling.  Skin:  Negative for wound.  Neurological:  Negative for weakness and numbness.    Physical Exam Updated Vital Signs BP 129/87   Pulse (!) 103   Temp 98.5 F (36.9 C) (Oral)   Resp 20   LMP 06/10/2022 (Exact Date)   SpO2 100%  Physical Exam Vitals and nursing note reviewed.  Constitutional:      General: She is not in acute distress.    Appearance: She is well-developed.  HENT:     Head: Normocephalic and atraumatic.  Eyes:     Conjunctiva/sclera: Conjunctivae normal.  Cardiovascular:     Rate and Rhythm: Regular rhythm. Tachycardia present.  Heart sounds: No murmur heard. Pulmonary:     Effort: Pulmonary effort is normal. No respiratory distress.     Breath sounds: Normal breath sounds.  Abdominal:     Palpations: Abdomen is soft.     Tenderness: There is no abdominal tenderness. There is no guarding or rebound.  Musculoskeletal:        General: Tenderness present. Normal range of motion.     Cervical back: Neck supple.     Comments: Right lower extremity nontender hip knee and ankle.  Full range of motion.  She has some tenderness on palpation of her medial thigh.  No pulsatile mass no overlying skin changes.  Distal pulses motor and sensation intact  Skin:    General: Skin is warm and dry.     Capillary Refill: Capillary refill takes less than 2 seconds.  Neurological:     General: No focal deficit  present.     Mental Status: She is alert.     Sensory: No sensory deficit.     Motor: No weakness.     Gait: Gait normal.     ED Results / Procedures / Treatments   Labs (all labs ordered are listed, but only abnormal results are displayed) Labs Reviewed - No data to display  EKG None  Radiology DG Hip Unilat With Pelvis 2-3 Views Right  Result Date: 06/21/2022 CLINICAL DATA:  Right upper leg pain for 2 days, no recent injury EXAM: DG HIP (WITH OR WITHOUT PELVIS) 2-3V RIGHT; RIGHT FEMUR 1 VIEW COMPARISON:  01/25/2021 FINDINGS: There is no evidence of acute hip fracture or dislocation. Unchanged appearance of plate and screw fixation of the right superior acetabulum. Prior right femoral neck osteotomy. Fracture or other abnormality of the right femur. There is no evidence of arthropathy or other focal bone abnormality. Nonobstructive pattern of overlying bowel gas. IMPRESSION: 1. No evidence of acute fracture or dislocation of the right hip or femur. 2. Unchanged appearance of plate and screw fixation of the right superior acetabulum. Electronically Signed   By: Delanna Ahmadi M.D.   On: 06/21/2022 21:04   DG Femur 1V Right  Result Date: 06/21/2022 CLINICAL DATA:  Right upper leg pain for 2 days, no recent injury EXAM: DG HIP (WITH OR WITHOUT PELVIS) 2-3V RIGHT; RIGHT FEMUR 1 VIEW COMPARISON:  01/25/2021 FINDINGS: There is no evidence of acute hip fracture or dislocation. Unchanged appearance of plate and screw fixation of the right superior acetabulum. Prior right femoral neck osteotomy. Fracture or other abnormality of the right femur. There is no evidence of arthropathy or other focal bone abnormality. Nonobstructive pattern of overlying bowel gas. IMPRESSION: 1. No evidence of acute fracture or dislocation of the right hip or femur. 2. Unchanged appearance of plate and screw fixation of the right superior acetabulum. Electronically Signed   By: Delanna Ahmadi M.D.   On: 06/21/2022 21:04    US Venous Img Lower Unilateral Right  Result Date: 06/21/2022 CLINICAL DATA:  Right medial thigh pain for 2 days, initial encounter EXAM: RIGHT LOWER EXTREMITY VENOUS DOPPLER ULTRASOUND TECHNIQUE: Gray-scale sonography with graded compression, as well as color Doppler and duplex ultrasound were performed to evaluate the lower extremity deep venous systems from the level of the common femoral vein and including the common femoral, femoral, profunda femoral, popliteal and calf veins including the posterior tibial, peroneal and gastrocnemius veins when visible. The superficial great saphenous vein was also interrogated. Spectral Doppler was utilized to evaluate flow at rest and with distal  augmentation maneuvers in the common femoral, femoral and popliteal veins. COMPARISON:  None Available. FINDINGS: Contralateral Common Femoral Vein: Respiratory phasicity is normal and symmetric with the symptomatic side. No evidence of thrombus. Normal compressibility. Common Femoral Vein: No evidence of thrombus. Normal compressibility, respiratory phasicity and response to augmentation. Saphenofemoral Junction: No evidence of thrombus. Normal compressibility and flow on color Doppler imaging. Profunda Femoral Vein: No evidence of thrombus. Normal compressibility and flow on color Doppler imaging. Femoral Vein: No evidence of thrombus. Normal compressibility, respiratory phasicity and response to augmentation. Popliteal Vein: No evidence of thrombus. Normal compressibility, respiratory phasicity and response to augmentation. Calf Veins: No evidence of thrombus. Normal compressibility and flow on color Doppler imaging. Superficial Great Saphenous Vein: No evidence of thrombus. Normal compressibility. Venous Reflux:  None. Other Findings:  None. IMPRESSION: No evidence of deep venous thrombosis. Electronically Signed   By: Inez Catalina M.D.   On: 06/21/2022 20:25    Procedures Procedures    Medications Ordered in  ED Medications - No data to display  ED Course/ Medical Decision Making/ A&P Clinical Course as of 06/22/22 0954  Sun Jun 21, 2022  2109 X-rays not showing any acute fracture.  Hardware intact.  Reviewed with patient she will follow-up with her orthopedic specialist. [MB]    Clinical Course User Index [MB] Hayden Rasmussen, MD                           Medical Decision Making Amount and/or Complexity of Data Reviewed Radiology: ordered.   This patient complains of right inner thigh pain; this involves an extensive number of treatment Options and is a complaint that carries with it a high risk of complications and morbidity. The differential includes musculoskeletal pain, hardware failure, DVT I ordered imaging studies which included duplex right lower extremity and x-rays right hip and femur and I independently    visualized and interpreted imaging which showed no acute findings Additional history obtained from patient's companion Previous records obtained and reviewed in epic no recent admissions Social determinants considered, no significant barriers Critical Interventions: None  After the interventions stated above, I reevaluated the patient and found patient to be well-appearing in no distress Admission and further testing considered, no indications for admission or further work-up at this time.  Recommended symptomatic management and follow-up with her orthopedic provider.  Return instructions discussed         Final Clinical Impression(s) / ED Diagnoses Final diagnoses:  Acute pain of right thigh    Rx / DC Orders ED Discharge Orders     None         Hayden Rasmussen, MD 06/22/22 [id number]

## 2022-06-21 NOTE — Discharge Instructions (Signed)
You were seen in the emergency department for pain in your right inner thigh.  You had an ultrasound that did not show any evidence of blood clot and x-rays that showed your hardware was intact.  You can use Tylenol and ibuprofen for pain and ice or heat to the area for comfort.  Contact your orthopedic doctor for follow-up.  Return if any worsening or concerning symptoms

## 2022-06-24 ENCOUNTER — Encounter: Payer: Self-pay | Admitting: Neurology

## 2022-06-24 NOTE — Telephone Encounter (Signed)
PW in MD's office for review and signature if appropriate.

## 2022-07-11 ENCOUNTER — Emergency Department (HOSPITAL_COMMUNITY): Payer: 59

## 2022-07-11 ENCOUNTER — Encounter (HOSPITAL_COMMUNITY): Payer: Self-pay

## 2022-07-11 ENCOUNTER — Emergency Department (HOSPITAL_COMMUNITY)
Admission: EM | Admit: 2022-07-11 | Discharge: 2022-07-12 | Discharge status: Against Medical Advice | Payer: 59 | Attending: Emergency Medicine | Admitting: Emergency Medicine

## 2022-07-11 ENCOUNTER — Other Ambulatory Visit: Payer: Self-pay

## 2022-07-11 ENCOUNTER — Emergency Department (HOSPITAL_COMMUNITY): Admission: EM | Admit: 2022-07-11 | Payer: 59 | Source: Home / Self Care

## 2022-07-11 DIAGNOSIS — S0990XA Unspecified injury of head, initial encounter: Secondary | ICD-10-CM | POA: Diagnosis not present

## 2022-07-11 DIAGNOSIS — Z5321 Procedure and treatment not carried out due to patient leaving prior to being seen by health care provider: Secondary | ICD-10-CM | POA: Diagnosis not present

## 2022-07-11 DIAGNOSIS — R519 Headache, unspecified: Secondary | ICD-10-CM | POA: Diagnosis not present

## 2022-07-11 DIAGNOSIS — R0781 Pleurodynia: Secondary | ICD-10-CM | POA: Diagnosis not present

## 2022-07-11 DIAGNOSIS — M25551 Pain in right hip: Secondary | ICD-10-CM | POA: Diagnosis not present

## 2022-07-11 LAB — PREGNANCY, URINE: Preg Test, Ur: NEGATIVE

## 2022-07-11 NOTE — ED Notes (Signed)
PT did not respond when being called
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
## 2022-07-13 DIAGNOSIS — L239 Allergic contact dermatitis, unspecified cause: Secondary | ICD-10-CM | POA: Diagnosis not present

## 2022-07-14 DIAGNOSIS — L239 Allergic contact dermatitis, unspecified cause: Secondary | ICD-10-CM | POA: Diagnosis not present

## 2022-07-16 DIAGNOSIS — Z79899 Other long term (current) drug therapy: Secondary | ICD-10-CM | POA: Diagnosis not present

## 2022-07-16 DIAGNOSIS — E785 Hyperlipidemia, unspecified: Secondary | ICD-10-CM | POA: Diagnosis not present

## 2022-07-16 DIAGNOSIS — W503XXA Accidental bite by another person, initial encounter: Secondary | ICD-10-CM | POA: Diagnosis not present

## 2022-07-16 DIAGNOSIS — R000 Tachycardia, unspecified: Secondary | ICD-10-CM | POA: Diagnosis not present

## 2022-07-22 DIAGNOSIS — J00 Acute nasopharyngitis [common cold]: Secondary | ICD-10-CM | POA: Diagnosis not present

## 2022-07-22 DIAGNOSIS — R0602 Shortness of breath: Secondary | ICD-10-CM | POA: Diagnosis not present

## 2022-07-22 DIAGNOSIS — Z6830 Body mass index (BMI) 30.0-30.9, adult: Secondary | ICD-10-CM | POA: Diagnosis not present

## 2022-08-07 DIAGNOSIS — Z8742 Personal history of other diseases of the female genital tract: Secondary | ICD-10-CM | POA: Diagnosis not present

## 2022-08-07 DIAGNOSIS — N898 Other specified noninflammatory disorders of vagina: Secondary | ICD-10-CM | POA: Diagnosis not present

## 2022-08-07 DIAGNOSIS — N941 Unspecified dyspareunia: Secondary | ICD-10-CM | POA: Diagnosis not present

## 2022-08-07 DIAGNOSIS — L700 Acne vulgaris: Secondary | ICD-10-CM | POA: Diagnosis not present

## 2022-08-07 DIAGNOSIS — N760 Acute vaginitis: Secondary | ICD-10-CM | POA: Diagnosis not present

## 2022-08-27 ENCOUNTER — Ambulatory Visit (INDEPENDENT_AMBULATORY_CARE_PROVIDER_SITE_OTHER): Payer: 59 | Admitting: Neurology

## 2022-08-27 ENCOUNTER — Encounter: Payer: Self-pay | Admitting: Neurology

## 2022-08-27 VITALS — BP 134/82 | HR 88

## 2022-08-27 DIAGNOSIS — G43E01 Chronic migraine with aura, not intractable, with status migrainosus: Secondary | ICD-10-CM | POA: Diagnosis not present

## 2022-08-27 MED ORDER — SUMATRIPTAN SUCCINATE 100 MG PO TABS: 100.0000 mg | ORAL_TABLET | ORAL | 11 refills | Status: DC | PRN

## 2022-08-27 MED ORDER — ONDANSETRON 4 MG PO TBDP: 4.0000 mg | ORAL_TABLET | Freq: Three times a day (TID) | ORAL | 3 refills | Status: DC | PRN

## 2022-08-27 MED ORDER — AMITRIPTYLINE HCL 50 MG PO TABS: 50.0000 mg | ORAL_TABLET | Freq: Every day | ORAL | 11 refills | Status: DC
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
## 2022-08-27 NOTE — Patient Instructions (Signed)
Increase Amitriptyline to 50 mg nightly  Use Zofran as needed for severe nausea  Use the sumatriptan 100 mg as needed for headache, can combine it with Aleve Follow-up in 3 months or sooner if worse.

## 2022-09-03 DIAGNOSIS — R002 Palpitations: Secondary | ICD-10-CM | POA: Diagnosis not present

## 2022-09-03 DIAGNOSIS — J029 Acute pharyngitis, unspecified: Secondary | ICD-10-CM | POA: Diagnosis not present

## 2022-09-03 DIAGNOSIS — R079 Chest pain, unspecified: Secondary | ICD-10-CM | POA: Diagnosis not present

## 2022-09-03 DIAGNOSIS — R5383 Other fatigue: Secondary | ICD-10-CM | POA: Diagnosis not present

## 2022-09-03 DIAGNOSIS — J069 Acute upper respiratory infection, unspecified: Secondary | ICD-10-CM | POA: Diagnosis not present

## 2022-09-04 DIAGNOSIS — R9431 Abnormal electrocardiogram [ECG] [EKG]: Secondary | ICD-10-CM | POA: Diagnosis not present

## 2022-09-07 DIAGNOSIS — L700 Acne vulgaris: Secondary | ICD-10-CM | POA: Diagnosis not present

## 2022-09-07 DIAGNOSIS — L710 Perioral dermatitis: Secondary | ICD-10-CM | POA: Diagnosis not present

## 2022-09-12 DIAGNOSIS — S60211A Contusion of right wrist, initial encounter: Secondary | ICD-10-CM | POA: Diagnosis not present

## 2022-09-20 ENCOUNTER — Other Ambulatory Visit: Payer: Self-pay

## 2022-09-20 ENCOUNTER — Encounter (HOSPITAL_BASED_OUTPATIENT_CLINIC_OR_DEPARTMENT_OTHER): Payer: Self-pay

## 2022-09-20 ENCOUNTER — Emergency Department (HOSPITAL_BASED_OUTPATIENT_CLINIC_OR_DEPARTMENT_OTHER)
Admission: EM | Admit: 2022-09-20 | Discharge: 2022-09-21 | Disposition: A | Discharge status: Home / Self Care | Payer: 59 | Source: Home / Self Care | Attending: Emergency Medicine | Admitting: Emergency Medicine

## 2022-09-20 ENCOUNTER — Emergency Department (HOSPITAL_BASED_OUTPATIENT_CLINIC_OR_DEPARTMENT_OTHER): Payer: 59

## 2022-09-20 DIAGNOSIS — R197 Diarrhea, unspecified: Secondary | ICD-10-CM | POA: Insufficient documentation

## 2022-09-20 DIAGNOSIS — R509 Fever, unspecified: Secondary | ICD-10-CM | POA: Diagnosis not present

## 2022-09-20 DIAGNOSIS — R112 Nausea with vomiting, unspecified: Secondary | ICD-10-CM | POA: Insufficient documentation

## 2022-09-20 DIAGNOSIS — J45909 Unspecified asthma, uncomplicated: Secondary | ICD-10-CM | POA: Insufficient documentation

## 2022-09-20 DIAGNOSIS — R1030 Lower abdominal pain, unspecified: Secondary | ICD-10-CM | POA: Insufficient documentation

## 2022-09-20 DIAGNOSIS — R000 Tachycardia, unspecified: Secondary | ICD-10-CM | POA: Insufficient documentation

## 2022-09-20 DIAGNOSIS — N939 Abnormal uterine and vaginal bleeding, unspecified: Secondary | ICD-10-CM | POA: Insufficient documentation

## 2022-09-20 DIAGNOSIS — Z20822 Contact with and (suspected) exposure to covid-19: Secondary | ICD-10-CM | POA: Insufficient documentation

## 2022-09-20 DIAGNOSIS — R109 Unspecified abdominal pain: Secondary | ICD-10-CM | POA: Diagnosis not present

## 2022-09-20 DIAGNOSIS — Z91010 Allergy to peanuts: Secondary | ICD-10-CM | POA: Insufficient documentation

## 2022-09-20 DIAGNOSIS — J101 Influenza due to other identified influenza virus with other respiratory manifestations: Secondary | ICD-10-CM | POA: Diagnosis not present

## 2022-09-20 LAB — LACTIC ACID, PLASMA: Lactic Acid, Venous: 1.1 mmol/L (ref 0.5–1.9)

## 2022-09-20 LAB — CBC WITH DIFFERENTIAL/PLATELET
Abs Immature Granulocytes: 0.04 10*3/uL (ref 0.00–0.07)
Basophils Absolute: 0.1 10*3/uL (ref 0.0–0.1)
Basophils Relative: 1 %
Eosinophils Absolute: 0.1 10*3/uL (ref 0.0–0.5)
Eosinophils Relative: 1 %
HCT: 36.6 % (ref 36.0–46.0)
Hemoglobin: 12.3 g/dL (ref 12.0–15.0)
Immature Granulocytes: 0 %
Lymphocytes Relative: 5 %
Lymphs Abs: 0.5 10*3/uL — ABNORMAL LOW (ref 0.7–4.0)
MCH: 28 pg (ref 26.0–34.0)
MCHC: 33.6 g/dL (ref 30.0–36.0)
MCV: 83.2 fL (ref 80.0–100.0)
Monocytes Absolute: 0.6 10*3/uL (ref 0.1–1.0)
Monocytes Relative: 6 %
Neutro Abs: 8.9 10*3/uL — ABNORMAL HIGH (ref 1.7–7.7)
Neutrophils Relative %: 87 %
Platelets: 323 10*3/uL (ref 150–400)
RBC: 4.4 MIL/uL (ref 3.87–5.11)
RDW: 14.4 % (ref 11.5–15.5)
WBC: 10.1 10*3/uL (ref 4.0–10.5)
nRBC: 0 % (ref 0.0–0.2)

## 2022-09-20 LAB — URINALYSIS, ROUTINE W REFLEX MICROSCOPIC
Bilirubin Urine: NEGATIVE
Glucose, UA: NEGATIVE mg/dL
Ketones, ur: NEGATIVE mg/dL
Leukocytes,Ua: NEGATIVE
Nitrite: NEGATIVE
Protein, ur: NEGATIVE mg/dL
Specific Gravity, Urine: 1.024 (ref 1.005–1.030)
pH: 6.5 (ref 5.0–8.0)

## 2022-09-20 LAB — HCG, SERUM, QUALITATIVE: Preg, Serum: NEGATIVE

## 2022-09-20 LAB — RESP PANEL BY RT-PCR (RSV, FLU A&B, COVID)  RVPGX2
Influenza A by PCR: NEGATIVE
Influenza B by PCR: NEGATIVE
Resp Syncytial Virus by PCR: NEGATIVE
SARS Coronavirus 2 by RT PCR: NEGATIVE

## 2022-09-20 LAB — COMPREHENSIVE METABOLIC PANEL
ALT: 61 U/L — ABNORMAL HIGH (ref 0–44)
AST: 47 U/L — ABNORMAL HIGH (ref 15–41)
Albumin: 4.6 g/dL (ref 3.5–5.0)
Alkaline Phosphatase: 66 U/L (ref 38–126)
Anion gap: 11 (ref 5–15)
BUN: 11 mg/dL (ref 6–20)
CO2: 24 mmol/L (ref 22–32)
Calcium: 10 mg/dL (ref 8.9–10.3)
Chloride: 100 mmol/L (ref 98–111)
Creatinine, Ser: 0.88 mg/dL (ref 0.44–1.00)
GFR, Estimated: >60 mL/min (ref >60)
Glucose, Bld: 124 mg/dL — ABNORMAL HIGH (ref 70–99)
Potassium: 3.5 mmol/L (ref 3.5–5.1)
Sodium: 135 mmol/L (ref 135–145)
Total Bilirubin: 0.2 mg/dL — ABNORMAL LOW (ref 0.3–1.2)
Total Protein: 8 g/dL (ref 6.5–8.1)

## 2022-09-20 MED ORDER — SODIUM CHLORIDE 0.9 % IV BOLUS
1000.0000 mL | Freq: Once | INTRAVENOUS | Status: AC
  Administered 2022-09-20: 1000 mL via INTRAVENOUS

## 2022-09-20 MED ORDER — KETAMINE HCL 10 MG/ML IJ SOLN
0.3000 mg/kg | Freq: Once | INTRAMUSCULAR | Status: AC
  Administered 2022-09-20: 22 mg via INTRAVENOUS
  Filled 2022-09-20: qty 1

## 2022-09-20 MED ORDER — IBUPROFEN 400 MG PO TABS
600.0000 mg | ORAL_TABLET | Freq: Once | ORAL | Status: AC
  Administered 2022-09-20: 600 mg via ORAL

## 2022-09-20 MED ORDER — HYDROMORPHONE HCL 1 MG/ML IJ SOLN
0.5000 mg | Freq: Once | INTRAMUSCULAR | Status: AC
  Administered 2022-09-20: 0.5 mg via INTRAVENOUS
  Filled 2022-09-20: qty 1

## 2022-09-20 MED ORDER — IBUPROFEN 200 MG PO TABS
ORAL_TABLET | ORAL | Status: AC
  Filled 2022-09-20: qty 2

## 2022-09-20 MED ORDER — IBUPROFEN 200 MG PO TABS
ORAL_TABLET | ORAL | Status: AC
  Filled 2022-09-20: qty 1

## 2022-09-20 MED ORDER — ONDANSETRON HCL 4 MG/2ML IJ SOLN
4.0000 mg | Freq: Once | INTRAMUSCULAR | Status: AC
  Administered 2022-09-20: 4 mg via INTRAVENOUS
  Filled 2022-09-20: qty 2

## 2022-09-20 MED ORDER — IOHEXOL 300 MG/ML  SOLN
100.0000 mL | Freq: Once | INTRAMUSCULAR | Status: AC | PRN
  Administered 2022-09-20: 80 mL via INTRAVENOUS
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
## 2022-09-20 NOTE — ED Triage Notes (Signed)
Pt reports abdominal pain LRQ that started yesterday. Pt report new onset fever and tachycardia today.

## 2022-09-21 ENCOUNTER — Other Ambulatory Visit: Payer: Self-pay | Admitting: Neurology

## 2022-09-21 DIAGNOSIS — R000 Tachycardia, unspecified: Secondary | ICD-10-CM | POA: Diagnosis not present

## 2022-09-21 DIAGNOSIS — B349 Viral infection, unspecified: Secondary | ICD-10-CM | POA: Diagnosis not present

## 2022-09-21 NOTE — ED Provider Notes (Signed)
Care of the patient assumed at the change of shift. Here for fever and abdominal pain. ED workup is neg but remains in pain and tachycardic. Pending reassessment after additional meds.  Physical Exam  BP 125/65   Pulse (!) 119   Temp 99.5 F (37.5 C) (Oral)   Resp 20   Wt 72.6 kg   SpO2 97%   BMI 30.23 kg/m   Physical Exam  Procedures  Procedures  ED Course / MDM   Clinical Course as of 09/21/22 0053  [facility] Sep 21, 2022  0053 Patient feeling better, HR and temp improved. She is comfortable going home. PCP follow up, RTED for any other concerns.   [CS]    Clinical Course User Index [CS] Truddie Hidden, MD   Medical Decision Making Problems Addressed: Fever, unspecified fever cause: acute illness or injury Nausea vomiting and diarrhea: acute illness or injury  Amount and/or Complexity of Data Reviewed Labs: ordered. Decision-making details documented in ED Course. Radiology: ordered and independent interpretation performed. Decision-making details documented in ED Course.  Risk Prescription drug management. Parenteral controlled substances.          Truddie Hidden, MD 09/21/22 [phone]

## 2022-09-22 ENCOUNTER — Emergency Department (HOSPITAL_BASED_OUTPATIENT_CLINIC_OR_DEPARTMENT_OTHER): Payer: 59

## 2022-09-22 ENCOUNTER — Other Ambulatory Visit: Payer: Self-pay

## 2022-09-22 ENCOUNTER — Inpatient Hospital Stay (HOSPITAL_BASED_OUTPATIENT_CLINIC_OR_DEPARTMENT_OTHER)
Admission: EM | Admit: 2022-09-22 | Discharge: 2022-09-24 | DRG: 195 | Disposition: A | Discharge status: Home / Self Care | Payer: 59 | Attending: Internal Medicine | Admitting: Internal Medicine

## 2022-09-22 ENCOUNTER — Encounter (HOSPITAL_BASED_OUTPATIENT_CLINIC_OR_DEPARTMENT_OTHER): Payer: Self-pay | Admitting: Emergency Medicine

## 2022-09-22 DIAGNOSIS — Z1152 Encounter for screening for COVID-19: Secondary | ICD-10-CM

## 2022-09-22 DIAGNOSIS — Z91010 Allergy to peanuts: Secondary | ICD-10-CM

## 2022-09-22 DIAGNOSIS — E669 Obesity, unspecified: Secondary | ICD-10-CM | POA: Diagnosis present

## 2022-09-22 DIAGNOSIS — J4520 Mild intermittent asthma, uncomplicated: Secondary | ICD-10-CM | POA: Diagnosis not present

## 2022-09-22 DIAGNOSIS — Z888 Allergy status to other drugs, medicaments and biological substances status: Secondary | ICD-10-CM | POA: Diagnosis not present

## 2022-09-22 DIAGNOSIS — R197 Diarrhea, unspecified: Secondary | ICD-10-CM | POA: Diagnosis not present

## 2022-09-22 DIAGNOSIS — J111 Influenza due to unidentified influenza virus with other respiratory manifestations: Secondary | ICD-10-CM

## 2022-09-22 DIAGNOSIS — J101 Influenza due to other identified influenza virus with other respiratory manifestations: Secondary | ICD-10-CM | POA: Diagnosis not present

## 2022-09-22 DIAGNOSIS — R109 Unspecified abdominal pain: Secondary | ICD-10-CM | POA: Diagnosis not present

## 2022-09-22 DIAGNOSIS — R000 Tachycardia, unspecified: Secondary | ICD-10-CM | POA: Diagnosis present

## 2022-09-22 DIAGNOSIS — D649 Anemia, unspecified: Secondary | ICD-10-CM | POA: Diagnosis present

## 2022-09-22 DIAGNOSIS — F419 Anxiety disorder, unspecified: Secondary | ICD-10-CM | POA: Diagnosis not present

## 2022-09-22 DIAGNOSIS — Z713 Dietary counseling and surveillance: Secondary | ICD-10-CM | POA: Diagnosis not present

## 2022-09-22 DIAGNOSIS — J309 Allergic rhinitis, unspecified: Secondary | ICD-10-CM | POA: Diagnosis present

## 2022-09-22 DIAGNOSIS — G40909 Epilepsy, unspecified, not intractable, without status epilepticus: Secondary | ICD-10-CM | POA: Diagnosis present

## 2022-09-22 DIAGNOSIS — R112 Nausea with vomiting, unspecified: Secondary | ICD-10-CM

## 2022-09-22 DIAGNOSIS — N939 Abnormal uterine and vaginal bleeding, unspecified: Secondary | ICD-10-CM | POA: Diagnosis present

## 2022-09-22 DIAGNOSIS — R079 Chest pain, unspecified: Secondary | ICD-10-CM | POA: Diagnosis not present

## 2022-09-22 DIAGNOSIS — Z6832 Body mass index (BMI) 32.0-32.9, adult: Secondary | ICD-10-CM | POA: Diagnosis not present

## 2022-09-22 DIAGNOSIS — F32A Depression, unspecified: Secondary | ICD-10-CM | POA: Diagnosis present

## 2022-09-22 DIAGNOSIS — R5381 Other malaise: Secondary | ICD-10-CM | POA: Diagnosis present

## 2022-09-22 DIAGNOSIS — Z79899 Other long term (current) drug therapy: Secondary | ICD-10-CM

## 2022-09-22 DIAGNOSIS — E66811 Obesity, class 1: Secondary | ICD-10-CM | POA: Diagnosis present

## 2022-09-22 DIAGNOSIS — I4719 Other supraventricular tachycardia: Secondary | ICD-10-CM | POA: Diagnosis present

## 2022-09-22 DIAGNOSIS — Z91014 Allergy to mammalian meats: Secondary | ICD-10-CM

## 2022-09-22 DIAGNOSIS — N926 Irregular menstruation, unspecified: Secondary | ICD-10-CM | POA: Insufficient documentation

## 2022-09-22 DIAGNOSIS — N912 Amenorrhea, unspecified: Secondary | ICD-10-CM | POA: Insufficient documentation

## 2022-09-22 LAB — TSH: TSH: 3.535 u[IU]/mL (ref 0.350–4.500)

## 2022-09-22 LAB — CBC WITH DIFFERENTIAL/PLATELET
Abs Immature Granulocytes: 0.03 10*3/uL (ref 0.00–0.07)
Basophils Absolute: 0 10*3/uL (ref 0.0–0.1)
Basophils Relative: 0 %
Eosinophils Absolute: 0 10*3/uL (ref 0.0–0.5)
Eosinophils Relative: 0 %
HCT: 34.6 % — ABNORMAL LOW (ref 36.0–46.0)
Hemoglobin: 11.5 g/dL — ABNORMAL LOW (ref 12.0–15.0)
Immature Granulocytes: 0 %
Lymphocytes Relative: 10 %
Lymphs Abs: 1.1 10*3/uL (ref 0.7–4.0)
MCH: 27.7 pg (ref 26.0–34.0)
MCHC: 33.2 g/dL (ref 30.0–36.0)
MCV: 83.4 fL (ref 80.0–100.0)
Monocytes Absolute: 0.6 10*3/uL (ref 0.1–1.0)
Monocytes Relative: 6 %
Neutro Abs: 8.5 10*3/uL — ABNORMAL HIGH (ref 1.7–7.7)
Neutrophils Relative %: 84 %
Platelets: 289 10*3/uL (ref 150–400)
RBC: 4.15 MIL/uL (ref 3.87–5.11)
RDW: 14.5 % (ref 11.5–15.5)
WBC: 10.3 10*3/uL (ref 4.0–10.5)
nRBC: 0 % (ref 0.0–0.2)

## 2022-09-22 LAB — RESPIRATORY PANEL BY PCR

## 2022-09-22 LAB — COMPREHENSIVE METABOLIC PANEL
ALT: 39 U/L (ref 0–44)
AST: 26 U/L (ref 15–41)
Albumin: 4.1 g/dL (ref 3.5–5.0)
Alkaline Phosphatase: 58 U/L (ref 38–126)
Anion gap: 12 (ref 5–15)
BUN: 7 mg/dL (ref 6–20)
CO2: 22 mmol/L (ref 22–32)
Calcium: 9.5 mg/dL (ref 8.9–10.3)
Chloride: 101 mmol/L (ref 98–111)
Creatinine, Ser: 0.87 mg/dL (ref 0.44–1.00)
GFR, Estimated: >60 mL/min (ref >60)
Glucose, Bld: 153 mg/dL — ABNORMAL HIGH (ref 70–99)
Potassium: 3.6 mmol/L (ref 3.5–5.1)
Sodium: 135 mmol/L (ref 135–145)
Total Bilirubin: 0.2 mg/dL — ABNORMAL LOW (ref 0.3–1.2)
Total Protein: 7.5 g/dL (ref 6.5–8.1)

## 2022-09-22 LAB — URINALYSIS, ROUTINE W REFLEX MICROSCOPIC
Bilirubin Urine: NEGATIVE
Glucose, UA: NEGATIVE mg/dL
Ketones, ur: NEGATIVE mg/dL
Leukocytes,Ua: NEGATIVE
Nitrite: NEGATIVE
Protein, ur: NEGATIVE mg/dL
Specific Gravity, Urine: 1.013 (ref 1.005–1.030)
pH: 6.5 (ref 5.0–8.0)

## 2022-09-22 LAB — MONONUCLEOSIS SCREEN: Mono Screen: NEGATIVE

## 2022-09-22 LAB — RESP PANEL BY RT-PCR (RSV, FLU A&B, COVID)  RVPGX2
Influenza A by PCR: POSITIVE — AB
Influenza B by PCR: NEGATIVE
Resp Syncytial Virus by PCR: NEGATIVE
SARS Coronavirus 2 by RT PCR: NEGATIVE

## 2022-09-22 LAB — LIPASE, BLOOD: Lipase: 16 U/L (ref 11–51)

## 2022-09-22 LAB — GROUP A STREP BY PCR: Group A Strep by PCR: NOT DETECTED

## 2022-09-22 LAB — MAGNESIUM: Magnesium: 1.7 mg/dL (ref 1.7–2.4)

## 2022-09-22 LAB — BRAIN NATRIURETIC PEPTIDE: B Natriuretic Peptide: 23.5 pg/mL (ref 0.0–100.0)

## 2022-09-22 LAB — LACTIC ACID, PLASMA
Lactic Acid, Venous: 2.5 mmol/L (ref 0.5–1.9)
Lactic Acid, Venous: 0.5 mmol/L (ref 0.5–1.9)

## 2022-09-22 LAB — T4, FREE: Free T4: 0.94 ng/dL (ref 0.61–1.12)

## 2022-09-22 LAB — PHOSPHORUS: Phosphorus: 3 mg/dL (ref 2.5–4.6)

## 2022-09-22 LAB — TROPONIN I (HIGH SENSITIVITY): Troponin I (High Sensitivity): 4 ng/L (ref <18)

## 2022-09-22 MED ORDER — KETOROLAC TROMETHAMINE 30 MG/ML IJ SOLN
30.0000 mg | Freq: Four times a day (QID) | INTRAMUSCULAR | Status: DC | PRN
  Administered 2022-09-23 – 2022-09-24 (×2): 30 mg via INTRAVENOUS
  Filled 2022-09-22 (×2): qty 1

## 2022-09-22 MED ORDER — PROCHLORPERAZINE EDISYLATE 10 MG/2ML IJ SOLN
10.0000 mg | Freq: Four times a day (QID) | INTRAMUSCULAR | Status: DC | PRN
  Filled 2022-09-22 (×2): qty 2

## 2022-09-22 MED ORDER — ACETAMINOPHEN 325 MG PO TABS: 650.0000 mg | ORAL_TABLET | Freq: Four times a day (QID) | ORAL | Status: DC | PRN

## 2022-09-22 MED ORDER — ACETAMINOPHEN 650 MG RE SUPP: 650.0000 mg | Freq: Four times a day (QID) | RECTAL | Status: DC | PRN

## 2022-09-22 MED ORDER — IBUPROFEN 400 MG PO TABS
600.0000 mg | ORAL_TABLET | Freq: Once | ORAL | Status: AC
  Administered 2022-09-22: 600 mg via ORAL
  Filled 2022-09-22: qty 1

## 2022-09-22 MED ORDER — ONDANSETRON HCL 4 MG/2ML IJ SOLN
4.0000 mg | Freq: Once | INTRAMUSCULAR | Status: AC
  Administered 2022-09-22: 4 mg via INTRAVENOUS
  Filled 2022-09-22: qty 2

## 2022-09-22 MED ORDER — ACETAMINOPHEN 500 MG PO TABS
1000.0000 mg | ORAL_TABLET | Freq: Once | ORAL | Status: AC
  Administered 2022-09-22: 1000 mg via ORAL
  Filled 2022-09-22: qty 2

## 2022-09-22 MED ORDER — ISOTRETINOIN 40 MG PO CAPS: 40.0000 mg | ORAL_CAPSULE | Freq: Every day | ORAL | Status: DC

## 2022-09-22 MED ORDER — PROCHLORPERAZINE MALEATE 10 MG PO TABS: 10.0000 mg | ORAL_TABLET | Freq: Three times a day (TID) | ORAL | 0 refills | Status: DC | PRN

## 2022-09-22 MED ORDER — SUMATRIPTAN SUCCINATE 50 MG PO TABS: 100.0000 mg | ORAL_TABLET | ORAL | Status: DC | PRN

## 2022-09-22 MED ORDER — HYDROMORPHONE HCL 1 MG/ML IJ SOLN
1.0000 mg | Freq: Once | INTRAMUSCULAR | Status: AC
  Administered 2022-09-22: 1 mg via INTRAVENOUS
  Filled 2022-09-22: qty 1

## 2022-09-22 MED ORDER — OSELTAMIVIR PHOSPHATE 75 MG PO CAPS
75.0000 mg | ORAL_CAPSULE | Freq: Two times a day (BID) | ORAL | Status: DC
  Administered 2022-09-22 – 2022-09-24 (×4): 75 mg via ORAL
  Filled 2022-09-22 (×4): qty 1

## 2022-09-22 MED ORDER — MAGNESIUM SULFATE 2 GM/50ML IV SOLN
2.0000 g | Freq: Once | INTRAVENOUS | Status: AC
  Administered 2022-09-22: 2 g via INTRAVENOUS
  Filled 2022-09-22: qty 50

## 2022-09-22 MED ORDER — PROCHLORPERAZINE EDISYLATE 10 MG/2ML IJ SOLN
10.0000 mg | Freq: Once | INTRAMUSCULAR | Status: AC
  Administered 2022-09-22: 10 mg via INTRAVENOUS
  Filled 2022-09-22: qty 2

## 2022-09-22 MED ORDER — VALACYCLOVIR HCL 500 MG PO TABS
1000.0000 mg | ORAL_TABLET | Freq: Every day | ORAL | Status: DC
  Administered 2022-09-22 – 2022-09-24 (×3): 1000 mg via ORAL
  Filled 2022-09-22 (×3): qty 2

## 2022-09-22 MED ORDER — ONDANSETRON HCL 4 MG PO TABS: 4.0000 mg | ORAL_TABLET | Freq: Four times a day (QID) | ORAL | Status: DC | PRN

## 2022-09-22 MED ORDER — IOHEXOL 350 MG/ML SOLN
100.0000 mL | Freq: Once | INTRAVENOUS | Status: AC | PRN
  Administered 2022-09-22: 75 mL via INTRAVENOUS

## 2022-09-22 MED ORDER — ATENOLOL 25 MG PO TABS
25.0000 mg | ORAL_TABLET | Freq: Every day | ORAL | Status: DC
  Administered 2022-09-22 – 2022-09-24 (×3): 25 mg via ORAL
  Filled 2022-09-22 (×3): qty 1

## 2022-09-22 MED ORDER — POTASSIUM CHLORIDE IN NACL 20-0.9 MEQ/L-% IV SOLN
INTRAVENOUS | Status: AC
  Filled 2022-09-22: qty 1000

## 2022-09-22 MED ORDER — LACTATED RINGERS IV BOLUS
1000.0000 mL | Freq: Once | INTRAVENOUS | Status: AC
  Administered 2022-09-22: 1000 mL via INTRAVENOUS

## 2022-09-22 MED ORDER — ONDANSETRON HCL 4 MG/2ML IJ SOLN: 4.0000 mg | Freq: Four times a day (QID) | INTRAMUSCULAR | Status: DC | PRN

## 2022-09-22 MED ORDER — POTASSIUM CHLORIDE IN NACL 20-0.9 MEQ/L-% IV SOLN
INTRAVENOUS | Status: DC
  Filled 2022-09-22 (×2): qty 1000

## 2022-09-22 MED ORDER — PANTOPRAZOLE SODIUM 40 MG IV SOLR
40.0000 mg | Freq: Once | INTRAVENOUS | Status: AC
  Administered 2022-09-22: 40 mg via INTRAVENOUS
  Filled 2022-09-22: qty 10

## 2022-09-22 MED ORDER — DROSPIRENONE-ETHINYL ESTRADIOL 3-0.02 MG PO TABS: 1.0000 | ORAL_TABLET | Freq: Every day | ORAL | Status: DC

## 2022-09-22 NOTE — Plan of Care (Signed)
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
## 2022-09-22 NOTE — ED Provider Notes (Signed)
Alisha Reynolds  Provider Note  CSN: [id number] Arrival date & time: 09/22/22 0415  History Chief Complaint  Patient presents with   Abdominal Pain   Fever    Alisha Reynolds is a 27 y.o. female with history of asthma, pneumonia, tonsillitis was in the ED yesterday for fever, tachycardia and lower abdominal pain with vomiting. Her ED workup was largely unremarkable including labs and CT. She was given multiple medications and IVF for symptom relief and was feeling better to the point where she was comfortable going home. She has continued to have symptoms through the day today including persistent lower abdominal pains, fevers and racing heart. Some nausea and sore throat as well. She has had some DUB for about three weeks but has not had a positive pregnancy test.   Home Medications Prior to Admission medications   Medication Sig Start Date End Date Taking? Authorizing Provider  prochlorperazine (COMPAZINE) 10 MG tablet Take 1 tablet (10 mg total) by mouth every 8 (eight) hours as needed for nausea or vomiting. 09/22/22  Yes Truddie Hidden, MD  amitriptyline (ELAVIL) 50 MG tablet Take 1 tablet (50 mg total) by mouth at bedtime. 08/27/22   Alric Ran, MD  doxycycline (VIBRAMYCIN) 100 MG capsule Take 1 capsule (100 mg total) by mouth 2 (two) times daily. Patient not taking: Reported on 08/27/2022 06/04/22   Flossie Dibble, NP  magic mouthwash (lidocaine, diphenhydrAMINE, alum & mag hydroxide) suspension Swish and spit 5 mLs in the morning and at bedtime. Patient not taking: Reported on 08/27/2022 06/04/22   Flossie Dibble, NP  methylPREDNISolone (MEDROL DOSEPAK) 4 MG TBPK tablet Take 6 tabs day 1, Take 5 tabs day 2, 4 tabs day 3, 3 tabs day 4, 2 tabs day 2, and 1 tab day 1 05/22/22   Alric Ran, MD  ondansetron (ZOFRAN-ODT) 4 MG disintegrating tablet Take 1 tablet (4 mg total) by mouth every 8 (eight) hours as needed for nausea or vomiting.  08/27/22   Alric Ran, MD  SUMAtriptan (IMITREX) 100 MG tablet Take 1 tablet (100 mg total) by mouth every 2 (two) hours as needed for up to 120 doses for migraine. May repeat in 2 hours if headache persists or recurs. 08/27/22   Alric Ran, MD  valACYclovir (VALTREX) 500 MG tablet Take 1 tablet (500 mg total) by mouth 2 (two) times daily. Patient taking differently: Take 500 mg by mouth daily. 06/04/22   Flossie Dibble, NP     Allergies    Other, Peanut-containing drug products, Oxycodone, Tramadol, and Pork-derived products   Review of Systems   Review of Systems Please see HPI for pertinent positives and negatives  Physical Exam BP 137/64   Pulse (!) 133   Temp 99.8 F (37.7 C) (Oral)   Resp 20   Wt 72.6 kg   LMP 09/05/2022 Comment: pt states she has been bleeding since 2/10  SpO2 97%   BMI 30.23 kg/m   Physical Exam Vitals and nursing note reviewed.  Constitutional:      Appearance: Normal appearance.  HENT:     Head: Normocephalic and atraumatic.     Nose: Nose normal.     Mouth/Throat:     Mouth: Mucous membranes are moist.     Pharynx: No pharyngeal swelling or oropharyngeal exudate.  Eyes:     Extraocular Movements: Extraocular movements intact.     Conjunctiva/sclera: Conjunctivae normal.  Cardiovascular:     Rate and Rhythm:  Tachycardia present.  Pulmonary:     Effort: Pulmonary effort is normal.     Breath sounds: Normal breath sounds.  Abdominal:     General: Abdomen is flat.     Palpations: Abdomen is soft.     Tenderness: There is abdominal tenderness in the suprapubic area. There is no guarding. Negative signs include Murphy's sign and McBurney's sign.  Musculoskeletal:        General: No swelling. Normal range of motion.     Cervical back: Neck supple.  Skin:    General: Skin is warm and dry.  Neurological:     General: No focal deficit present.     Mental Status: She is alert.  Psychiatric:        Mood and Affect: Mood normal.      ED Results / Procedures / Treatments   EKG None  Procedures Procedures  Medications Ordered in the ED Medications  acetaminophen (TYLENOL) tablet 1,000 mg (1,000 mg Oral Not Given 09/22/22 0454)  lactated ringers bolus 1,000 mL (0 mLs Intravenous Stopped 09/22/22 0558)  HYDROmorphone (DILAUDID) injection 1 mg (1 mg Intravenous Given 09/22/22 0503)  ondansetron (ZOFRAN) injection 4 mg (4 mg Intravenous Given 09/22/22 0502)  ibuprofen (ADVIL) tablet 600 mg (600 mg Oral Given 09/22/22 0505)  lactated ringers bolus 1,000 mL (1,000 mLs Intravenous New Bag/Given 09/22/22 0557)  prochlorperazine (COMPAZINE) injection 10 mg (10 mg Intravenous Given 09/22/22 0554)    Initial Impression and Plan  Patient with persistent fever, tachycardia and lower abdominal pain. Will recheck labs to compare to yesterday's ED visit. Add strep, mono and full RVP.   ED Course   Clinical Course as of 09/22/22 0714  Tue Sep 22, 2022  0510 CBC is unchanged, WBC remains normal.  [CS]  0523 Mono is neg.  [CS]  [id number] Strep is neg.  [CS]  [id number] CMP and Lipase are normal.  [CS]  0532 Lactic acid is mildly elevated, will give additional IVF and reassess after. She does not have an apparent bacterial source of her fever identified yet, will hold off on empiric antibiotics pending further source identification.  [CS]  0539 Covid/Flu/RSV swab is now positive for influenza which is the likely source of her fever.  [CS]  [id number] Patient continues to vomit. This is a chronic issue for her and she has been given Haldol in the past which she states 'snows her for three days' she does not want that or Droperidol. Will try compazine.  [CS]  0700 Patient feeling better with Compazine. Repeat lactic acid is pending after IVF. Anticipate she will be able to go home then with Rx for compazine. Care will be signed out to the oncoming team at the change of shift.  [CS]    Clinical Course User Index [CS] Truddie Hidden, MD     MDM  Rules/Calculators/A&P Medical Decision Making Problems Addressed: Influenza: acute illness or injury Nausea and vomiting, unspecified vomiting type: chronic illness or injury with exacerbation, progression, or side effects of treatment  Amount and/or Complexity of Data Reviewed Labs: ordered. Decision-making details documented in ED Course.  Risk OTC drugs. Prescription drug management. Parenteral controlled substances.     Final Clinical Impression(s) / ED Diagnoses Final diagnoses:  Influenza  Nausea and vomiting, unspecified vomiting type    Rx / DC Orders ED Discharge Orders          Ordered    prochlorperazine (COMPAZINE) 10 MG tablet  Every 8 hours PRN  09/22/22 [id number]             Truddie Hidden, MD 09/22/22 [phone]

## 2022-09-22 NOTE — Progress Notes (Signed)
Plan of Care Note for accepted transfer   Patient: Alisha Reynolds MRN: [id number]   DOA: 09/22/2022  [facility]ility [facility] transfer: [facility] Requesting Provider: Regan Lemming MD. Reason for transfer: Tachycardia, intractable nausea and vomiting in the setting of influenza A. Facility course:  Per Dr. Karle Starch: " Chief Complaint  Patient presents with   Abdominal Pain   Fever    Alisha Reynolds is a 27 y.o. female with history of asthma, pneumonia, tonsillitis was in the ED yesterday for fever, tachycardia and lower abdominal pain with vomiting. Her ED workup was largely unremarkable including labs and CT. She was given multiple medications and IVF for symptom relief and was feeling better to the point where she was comfortable going home. She has continued to have symptoms through the day today including persistent lower abdominal pains, fevers and racing heart. Some nausea and sore throat as well. She has had some DUB for about three weeks but has not had a positive pregnancy test."  Lab work: Brain natriuretic peptide [id number]   Collected: 09/22/22 0442   Updated: 09/22/22 0934   Specimen Type: Blood   Specimen Source: Vein    B Natriuretic Peptide 23.5 pg/mL  Troponin I (High Sensitivity) [id number]   Collected: 09/22/22 0442   Updated: 09/22/22 0853   Specimen Source: Vein    Troponin I (High Sensitivity) 4 ng/L  Lactic acid, plasma [id number]   Collected: 09/22/22 0630   Updated: 09/22/22 0806   Specimen Type: Blood   Specimen Source: Vein    Lactic Acid, Venous 0.5 mmol/L  Urinalysis, Routine w reflex microscopic -Urine, Clean Catch [id number] (Abnormal)   Collected: 09/22/22 0700   Updated: 09/22/22 0708   Specimen Source: Urine, Clean Catch    Color, Urine YELLOW   APPearance CLEAR   Specific Gravity, Urine 1.013   pH 6.5   Glucose, UA NEGATIVE mg/dL   Hgb urine dipstick MODERATE Abnormal    Bilirubin Urine NEGATIVE   Ketones, ur NEGATIVE mg/dL   Protein, ur  NEGATIVE mg/dL   Nitrite NEGATIVE   Leukocytes,Ua NEGATIVE   RBC / HPF 6-10 RBC/hpf   WBC, UA 0-5 WBC/hpf   Bacteria, UA RARE Abnormal    Squamous Epithelial / HPF 0-5 /HPF   Mucus PRESENT  Resp panel by RT-PCR (RSV, Flu A&B, Covid) Anterior Nasal Swab [id number] (Abnormal)   Collected: 09/22/22 0452   Updated: 09/22/22 0538   Specimen Source: Anterior Nasal Swab    SARS Coronavirus 2 by RT PCR NEGATIVE   Influenza A by PCR POSITIVE Abnormal    Influenza B by PCR NEGATIVE   Resp Syncytial Virus by PCR NEGATIVE  Lactic acid, plasma [id number] (Abnormal)   Collected: 09/22/22 0440   Updated: 09/22/22 0531   Specimen Type: Blood    Lactic Acid, Venous 2.5 High Panic  mmol/L  Comprehensive metabolic panel [id number] (Abnormal)   Collected: 09/22/22 0442   Updated: 09/22/22 0528   Specimen Type: Blood    Sodium 135 mmol/L   Potassium 3.6 mmol/L   Chloride 101 mmol/L   CO2 22 mmol/L   Glucose, Bld 153 High  mg/dL   BUN 7 mg/dL   Creatinine, Ser 0.87 mg/dL   Calcium 9.5 mg/dL   Total Protein 7.5 g/dL   Albumin 4.1 g/dL   AST 26 U/L   ALT 39 U/L   Alkaline Phosphatase 58 U/L   Total Bilirubin 0.2 Low  mg/dL   GFR, Estimated >60 mL/min   Anion gap 12  Lipase, blood [  [id number]   Collected: 09/22/22 0442   Updated: 09/22/22 0528   Specimen Type: Blood    Lipase 16 U/L  Group A Strep by PCR [id number]   Collected: 09/22/22 0452   Updated: 09/22/22 0525   Specimen Type: Sterile Swab   Specimen Source: Throat    Group A Strep by PCR NOT DETECTED  Mononucleosis screen [id number]   Collected: 09/22/22 0442   Updated: 09/22/22 0523   Specimen Type: Blood    Mono Screen NEGATIVE  Respiratory (~20 pathogens) panel by PCR [id number]   Collected: 09/22/22 0452   Updated: 09/22/22 0514   Specimen Type: Respiratory   Specimen Source: Nasopharyngeal Swab   CBC with Differential [id number] (Abnormal)   Collected: 09/22/22 0442   Updated: 09/22/22 0506   Specimen Type:  Blood    WBC 10.3 K/uL   RBC 4.15 MIL/uL   Hemoglobin 11.5 Low  g/dL   HCT 34.6 Low  %   MCV 83.4 fL   MCH 27.7 pg   MCHC 33.2 g/dL   RDW 14.5 %   Platelets 289 K/uL   nRBC 0.0 %   Neutrophils Relative % 84 %   Neutro Abs 8.5 High  K/uL   Lymphocytes Relative 10 %   Lymphs Abs 1.1 K/uL   Monocytes Relative 6 %   Monocytes Absolute 0.6 K/uL   Eosinophils Relative 0 %   Eosinophils Absolute 0.0 K/uL   Basophils Relative 0 %   Basophils Absolute 0.0 K/uL   Immature Granulocytes 0 %   Abs Immature Granulocytes 0.03 K/uL  Culture, blood (routine x 2) [id number]   Collected: 09/22/22 0442   Updated: 09/22/22 0506   Specimen Type: Blood   Culture, blood (routine x 2) [id number]   Collected: 09/22/22 0442   Updated: 09/22/22 0458   Specimen Type: Blood    Imaging: CTA chest:  FINDINGS: Cardiovascular: The quality of this exam for evaluation of pulmonary embolism is moderate. Limitations include suboptimal bolus timing, centered in the SVC, as well as patient body habitus.   No pulmonary embolism to the large segmental level.   Normal aortic caliber. Mild cardiomegaly, without pericardial effusion.   Mediastinum/Nodes: No mediastinal or hilar adenopathy. 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   Lungs/Pleura: No pleural fluid.  Clear lungs.   Upper Abdomen: Normal imaged portions of the liver, spleen, stomach, pancreas, adrenal glands, kidneys.   Musculoskeletal: No acute osseous abnormality.   Review of the MIP images confirms the above findings.   IMPRESSION: 1. Moderate quality evaluation for pulmonary embolism. No pulmonary embolism to the large segmental level. 2. No other explanation for patient's symptoms.     Electronically Signed   By: Abigail Miyamoto M.D.   On: 09/22/2022 09:00  Plan of care: The patient is accepted for admission to Telemetry unit, at [facility].  Author: Reubin Milan, MD 09/22/2022  Check [email or web address] for  on-call coverage.  Nursing staff, Please call [place] number on Amion as soon as patient's arrival, so appropriate admitting provider can evaluate the pt.

## 2022-09-22 NOTE — ED Notes (Signed)
Marc[facility] Mo[facility] called for transport

## 2022-09-22 NOTE — ED Triage Notes (Signed)
Pt in with low abdominal pain since Saturday, states she was evaluated on 2/25 in ED. Arrives today with worsened pain and temp of 102.9

## 2022-09-22 NOTE — ED Provider Notes (Signed)
  Physical Exam  BP 137/64   Pulse (!) 133   Temp 99.8 F (37.7 C) (Oral)   Resp 20   Wt 72.6 kg   LMP 09/05/2022 Comment: pt states she has been bleeding since 2/10  SpO2 97%   BMI 30.23 kg/m     Procedures  Procedures  ED Course / MDM   Clinical Course as of 09/22/22 0914  Tue Sep 22, 2022  0510 CBC is unchanged, WBC remains normal.  [CS]  0523 Mono is neg.  [CS]  0527 Strep is neg.  [CS]  [id number] CMP and Lipase are normal.  [CS]  0532 Lactic acid is mildly elevated, will give additional IVF and reassess after. She does not have an apparent bacterial source of her fever identified yet, will hold off on empiric antibiotics pending further source identification.  [CS]  0539 Covid/Flu/RSV swab is now positive for influenza which is the likely source of her fever.  [CS]  [id number] Patient continues to vomit. This is a chronic issue for her and she has been given Haldol in the past which she states 'snows her for three days' she does not want that or Droperidol. Will try compazine.  [CS]  0700 Patient feeling better with Compazine. Repeat lactic acid is pending after IVF. Anticipate she will be able to go home then with Rx for compazine. Care will be signed out to the oncoming team at the change of shift.  [CS]  0841 Influenza A By PCR(!): POSITIVE [JL]    Clinical Course User Index [CS] Truddie Hidden, MD [JL] Regan Lemming, MD   Medical Decision Making Amount and/or Complexity of Data Reviewed Labs: ordered. Decision-making details documented in ED Course. Radiology: ordered.  Risk OTC drugs. Prescription drug management. Decision regarding hospitalization.   27 year old female presenting to the emergency department with nausea, vomiting, abdominal pain.  Found to be flu positive.  Received multiple antiemetics and IV fluid resuscitation.  Initial lactic acidosis noted to 2.5, repeat normalized after fluids.    On repeat assessment, the patient was not tolerating oral  intake.  Her fever defervesced from 102.9-99.8 however she remained tachycardic to the 130s.  This is after fluid resuscitation.  She endorses mild chest pressure and shortness of breath which certainly could be explained by influenza however PE cannot be ruled out.  CTA imaging performed:  IMPRESSION:  1. Moderate quality evaluation for pulmonary embolism. No pulmonary  embolism to the large segmental level.  2. No other explanation for patient's symptoms.    As the patient was unable to tolerate oral intake in the setting of her influenza, hospitalist medicine was consulted for admission for rehydration and further antiemetics.  Dr. Olevia Bowens accepted the patient to the hospitalist service.  The patient was updated regarding the plan of care.         Regan Lemming, MD 09/22/22 [phone]

## 2022-09-23 ENCOUNTER — Inpatient Hospital Stay (HOSPITAL_COMMUNITY): Payer: 59

## 2022-09-23 DIAGNOSIS — R9431 Abnormal electrocardiogram [ECG] [EKG]: Secondary | ICD-10-CM | POA: Diagnosis not present

## 2022-09-23 DIAGNOSIS — R000 Tachycardia, unspecified: Secondary | ICD-10-CM | POA: Diagnosis not present

## 2022-09-23 DIAGNOSIS — E669 Obesity, unspecified: Secondary | ICD-10-CM | POA: Diagnosis not present

## 2022-09-23 DIAGNOSIS — Z91010 Allergy to peanuts: Secondary | ICD-10-CM | POA: Diagnosis not present

## 2022-09-23 DIAGNOSIS — Z79899 Other long term (current) drug therapy: Secondary | ICD-10-CM | POA: Diagnosis not present

## 2022-09-23 DIAGNOSIS — J4520 Mild intermittent asthma, uncomplicated: Secondary | ICD-10-CM | POA: Diagnosis not present

## 2022-09-23 DIAGNOSIS — Z91014 Allergy to mammalian meats: Secondary | ICD-10-CM | POA: Diagnosis not present

## 2022-09-23 DIAGNOSIS — Z713 Dietary counseling and surveillance: Secondary | ICD-10-CM | POA: Diagnosis not present

## 2022-09-23 DIAGNOSIS — F32A Depression, unspecified: Secondary | ICD-10-CM | POA: Diagnosis not present

## 2022-09-23 DIAGNOSIS — J101 Influenza due to other identified influenza virus with other respiratory manifestations: Secondary | ICD-10-CM

## 2022-09-23 DIAGNOSIS — D649 Anemia, unspecified: Secondary | ICD-10-CM

## 2022-09-23 DIAGNOSIS — R197 Diarrhea, unspecified: Secondary | ICD-10-CM | POA: Diagnosis not present

## 2022-09-23 DIAGNOSIS — Z888 Allergy status to other drugs, medicaments and biological substances status: Secondary | ICD-10-CM | POA: Diagnosis not present

## 2022-09-23 DIAGNOSIS — I4719 Other supraventricular tachycardia: Secondary | ICD-10-CM | POA: Diagnosis not present

## 2022-09-23 DIAGNOSIS — R112 Nausea with vomiting, unspecified: Secondary | ICD-10-CM | POA: Diagnosis not present

## 2022-09-23 DIAGNOSIS — G40909 Epilepsy, unspecified, not intractable, without status epilepticus: Secondary | ICD-10-CM | POA: Diagnosis not present

## 2022-09-23 DIAGNOSIS — J309 Allergic rhinitis, unspecified: Secondary | ICD-10-CM | POA: Diagnosis not present

## 2022-09-23 DIAGNOSIS — N939 Abnormal uterine and vaginal bleeding, unspecified: Secondary | ICD-10-CM | POA: Diagnosis not present

## 2022-09-23 DIAGNOSIS — Z6832 Body mass index (BMI) 32.0-32.9, adult: Secondary | ICD-10-CM | POA: Diagnosis not present

## 2022-09-23 DIAGNOSIS — Z1152 Encounter for screening for COVID-19: Secondary | ICD-10-CM | POA: Diagnosis not present

## 2022-09-23 DIAGNOSIS — R5381 Other malaise: Secondary | ICD-10-CM | POA: Diagnosis not present

## 2022-09-23 DIAGNOSIS — F419 Anxiety disorder, unspecified: Secondary | ICD-10-CM | POA: Diagnosis not present

## 2022-09-23 LAB — ECHOCARDIOGRAM COMPLETE
Area-P 1/2: 3.77 cm2
Calc EF: 56 %
Height: 61 in
S' Lateral: 2.6 cm
Single Plane A2C EF: 54.2 %
Single Plane A4C EF: 60.7 %
Weight: 2744.29 oz

## 2022-09-23 LAB — COMPREHENSIVE METABOLIC PANEL
ALT: 34 U/L (ref 0–44)
AST: 24 U/L (ref 15–41)
Albumin: 3.2 g/dL — ABNORMAL LOW (ref 3.5–5.0)
Alkaline Phosphatase: 57 U/L (ref 38–126)
Anion gap: 7 (ref 5–15)
BUN: 5 mg/dL — ABNORMAL LOW (ref 6–20)
CO2: 23 mmol/L (ref 22–32)
Calcium: 8.3 mg/dL — ABNORMAL LOW (ref 8.9–10.3)
Chloride: 106 mmol/L (ref 98–111)
Creatinine, Ser: 0.81 mg/dL (ref 0.44–1.00)
GFR, Estimated: >60 mL/min (ref >60)
Glucose, Bld: 87 mg/dL (ref 70–99)
Potassium: 3.7 mmol/L (ref 3.5–5.1)
Sodium: 136 mmol/L (ref 135–145)
Total Bilirubin: 0.5 mg/dL (ref 0.3–1.2)
Total Protein: 6.1 g/dL — ABNORMAL LOW (ref 6.5–8.1)

## 2022-09-23 LAB — CBC
HCT: 30.7 % — ABNORMAL LOW (ref 36.0–46.0)
Hemoglobin: 9.9 g/dL — ABNORMAL LOW (ref 12.0–15.0)
MCH: 27.7 pg (ref 26.0–34.0)
MCHC: 32.2 g/dL (ref 30.0–36.0)
MCV: 86 fL (ref 80.0–100.0)
Platelets: 250 10*3/uL (ref 150–400)
RBC: 3.57 MIL/uL — ABNORMAL LOW (ref 3.87–5.11)
RDW: 14.6 % (ref 11.5–15.5)
WBC: 7 10*3/uL (ref 4.0–10.5)
nRBC: 0 % (ref 0.0–0.2)

## 2022-09-23 LAB — HIV ANTIBODY (ROUTINE TESTING W REFLEX): HIV Screen 4th Generation wRfx: NONREACTIVE

## 2022-09-23 MED ORDER — IPRATROPIUM-ALBUTEROL 0.5-2.5 (3) MG/3ML IN SOLN: 3.0000 mL | RESPIRATORY_TRACT | Status: DC | PRN

## 2022-09-23 MED ORDER — PANTOPRAZOLE SODIUM 40 MG PO TBEC
40.0000 mg | DELAYED_RELEASE_TABLET | Freq: Every day | ORAL | Status: DC
  Administered 2022-09-23 – 2022-09-24 (×2): 40 mg via ORAL
  Filled 2022-09-23 (×2): qty 1

## 2022-09-23 MED ORDER — BUDESONIDE 0.5 MG/2ML IN SUSP
0.5000 mg | Freq: Two times a day (BID) | RESPIRATORY_TRACT | Status: DC
  Administered 2022-09-23 – 2022-09-24 (×3): 0.5 mg via RESPIRATORY_TRACT
  Filled 2022-09-23 (×3): qty 2

## 2022-09-23 MED ORDER — ONDANSETRON HCL 4 MG/2ML IJ SOLN
4.0000 mg | Freq: Four times a day (QID) | INTRAMUSCULAR | Status: DC | PRN
  Administered 2022-09-23: 4 mg via INTRAVENOUS
  Filled 2022-09-23: qty 2

## 2022-09-23 MED ORDER — ONDANSETRON HCL 4 MG/2ML IJ SOLN
4.0000 mg | Freq: Once | INTRAMUSCULAR | Status: AC
  Administered 2022-09-23: 4 mg via INTRAVENOUS
  Filled 2022-09-23: qty 2

## 2022-09-23 MED ORDER — METHYLPREDNISOLONE SODIUM SUCC 40 MG IJ SOLR
40.0000 mg | Freq: Two times a day (BID) | INTRAMUSCULAR | Status: DC
  Administered 2022-09-23 – 2022-09-24 (×3): 40 mg via INTRAVENOUS
  Filled 2022-09-23 (×3): qty 1

## 2022-09-23 MED ORDER — ACETAMINOPHEN 325 MG PO TABS
650.0000 mg | ORAL_TABLET | Freq: Once | ORAL | Status: AC
  Administered 2022-09-23: 650 mg via ORAL
  Filled 2022-09-23: qty 2

## 2022-09-23 NOTE — Progress Notes (Signed)
   09/22/22 1941  Assess: MEWS Score  Level of Consciousness Alert  Assess: MEWS Score  MEWS Temp 0  MEWS Systolic 0  MEWS Pulse 1  MEWS RR 0  MEWS LOC 0  MEWS Score 1  MEWS Score Color Green  Assess: if the MEWS score is Yellow or Red  Were vital signs taken at a resting state? Yes  Focused Assessment No change from prior assessment  Does the patient meet 2 or more of the SIRS criteria? Yes  Does the patient have a confirmed or suspected source of infection? Yes  Provider and Rapid Response Notified? No Clarene Essex, NP notified)  MEWS guidelines implemented  Yes, red  Treat  MEWS Interventions Considered administering scheduled or prn medications/treatments as ordered  Take Vital Signs  Increase Vital Sign Frequency  Red: Q1hr x2, continue Q4hrs until patient remains green for 12hrs  Escalate  MEWS: Escalate Red: Discuss with charge nurse and notify provider. Consider notifying RRT. If remains red for 2 hours consider need for higher level of care
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
## 2022-09-23 NOTE — Progress Notes (Signed)
Echocardiogram 2D Echocardiogram has been performed.  Alisha Reynolds 09/23/2022, 2:08 PM

## 2022-09-23 NOTE — Progress Notes (Signed)
   09/23/22 0026  Assess: MEWS Score  Temp (!) 101.3 F (38.5 C)  BP (!) 149/82  MAP (mmHg) 99  Pulse Rate (!) 111  ECG Heart Rate (!) 113  Resp 18  SpO2 100 %  O2 Device Room Air  Assess: MEWS Score  MEWS Temp 1  MEWS Systolic 0  MEWS Pulse 2  MEWS RR 0  MEWS LOC 0  MEWS Score 3  MEWS Score Color Yellow  Assess: SIRS CRITERIA  SIRS Temperature  1  SIRS Pulse 1  SIRS Respirations  0  SIRS WBC 0  SIRS Score Sum  2     09/23/22 0026  Assess: MEWS Score  Level of Consciousness Alert  Assess: MEWS Score                       Assess: if the MEWS score is Yellow or Red  Were vital signs taken at a resting state? Yes  Focused Assessment No change from prior assessment  Does the patient meet 2 or more of the SIRS criteria? Yes  Does the patient have a confirmed or suspected source of infection? Yes  Provider and Rapid Response Notified? No Gershon Cull, NP notified)  MEWS guidelines implemented  No, previously red, continue vital signs every 4 hours

## 2022-09-24 DIAGNOSIS — I4719 Other supraventricular tachycardia: Secondary | ICD-10-CM | POA: Diagnosis not present

## 2022-09-24 DIAGNOSIS — E669 Obesity, unspecified: Secondary | ICD-10-CM | POA: Diagnosis not present

## 2022-09-24 DIAGNOSIS — D649 Anemia, unspecified: Secondary | ICD-10-CM | POA: Diagnosis not present

## 2022-09-24 DIAGNOSIS — J101 Influenza due to other identified influenza virus with other respiratory manifestations: Secondary | ICD-10-CM | POA: Diagnosis not present

## 2022-09-24 DIAGNOSIS — N939 Abnormal uterine and vaginal bleeding, unspecified: Secondary | ICD-10-CM | POA: Insufficient documentation

## 2022-09-24 DIAGNOSIS — J4520 Mild intermittent asthma, uncomplicated: Secondary | ICD-10-CM | POA: Diagnosis not present

## 2022-09-24 LAB — CBC
HCT: 36.8 % (ref 36.0–46.0)
Hemoglobin: 11.9 g/dL — ABNORMAL LOW (ref 12.0–15.0)
MCH: 27.6 pg (ref 26.0–34.0)
MCHC: 32.3 g/dL (ref 30.0–36.0)
MCV: 85.4 fL (ref 80.0–100.0)
Platelets: 304 10*3/uL (ref 150–400)
RBC: 4.31 MIL/uL (ref 3.87–5.11)
RDW: 14.2 % (ref 11.5–15.5)
WBC: 5.7 10*3/uL (ref 4.0–10.5)
nRBC: 0 % (ref 0.0–0.2)

## 2022-09-24 LAB — BASIC METABOLIC PANEL
Anion gap: 10 (ref 5–15)
BUN: 11 mg/dL (ref 6–20)
CO2: 23 mmol/L (ref 22–32)
Calcium: 9.2 mg/dL (ref 8.9–10.3)
Chloride: 101 mmol/L (ref 98–111)
Creatinine, Ser: 0.86 mg/dL (ref 0.44–1.00)
GFR, Estimated: >60 mL/min (ref >60)
Glucose, Bld: 114 mg/dL — ABNORMAL HIGH (ref 70–99)
Potassium: 4 mmol/L (ref 3.5–5.1)
Sodium: 134 mmol/L — ABNORMAL LOW (ref 135–145)

## 2022-09-24 LAB — MAGNESIUM: Magnesium: 2.9 mg/dL — ABNORMAL HIGH (ref 1.7–2.4)

## 2022-09-24 MED ORDER — ACETAMINOPHEN 500 MG PO TABS
1000.0000 mg | ORAL_TABLET | Freq: Once | ORAL | Status: AC
  Administered 2022-09-24: 1000 mg via ORAL
  Filled 2022-09-24: qty 2

## 2022-09-24 MED ORDER — PANTOPRAZOLE SODIUM 40 MG PO TBEC: 40.0000 mg | DELAYED_RELEASE_TABLET | Freq: Every day | ORAL | 0 refills | Status: DC

## 2022-09-24 MED ORDER — OSELTAMIVIR PHOSPHATE 75 MG PO CAPS: 75.0000 mg | ORAL_CAPSULE | Freq: Two times a day (BID) | ORAL | 0 refills | Status: AC

## 2022-09-24 MED ORDER — PREDNISONE 10 MG PO TABS: 30.0000 mg | ORAL_TABLET | Freq: Every day | ORAL | 0 refills | Status: AC

## 2022-09-24 MED ORDER — ALBUTEROL SULFATE HFA 108 (90 BASE) MCG/ACT IN AERS: 2.0000 | INHALATION_SPRAY | Freq: Four times a day (QID) | RESPIRATORY_TRACT | 2 refills | Status: AC | PRN

## 2022-09-24 MED ORDER — DICYCLOMINE HCL 10 MG PO CAPS: 10.0000 mg | ORAL_CAPSULE | Freq: Three times a day (TID) | ORAL | 0 refills | Status: DC | PRN

## 2022-09-24 NOTE — Discharge Summary (Signed)
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 symptoms. Clear liquid and advance diet over the weekend.   Increase activity slowly   Complete by: As directed       Allergies as of 09/24/2022       Reactions   Other Anaphylaxis   All types of nuts   Peanut-containing Drug Products Anaphylaxis   All types of nuts.    Oxycodone Other (See Comments)   Hallucinations   Tramadol Other (See Comments)   Throat and tongue swelled up   Pork-derived Products Hives, Rash        Medication List     STOP taking these medications    doxycycline 100 MG capsule Commonly known as: VIBRAMYCIN   magic mouthwash (lidocaine, diphenhydrAMINE, alum & mag hydroxide) suspension   methylPREDNISolone 4 MG Tbpk tablet Commonly known as: MEDROL DOSEPAK       TAKE these medications    albuterol 108 (90 Base) MCG/ACT inhaler Commonly known as: VENTOLIN HFA Inhale 2 puffs into the lungs every 6 (six) hours as needed for wheezing or shortness of breath.   amitriptyline 50 MG tablet Commonly known as: ELAVIL Take 1 tablet (50 mg total) by mouth at bedtime.   atenolol 25 MG tablet Commonly known as: TENORMIN Take 25 mg by mouth daily.   Claravis 40 MG capsule Generic drug: ISOtretinoin Take 40 mg by mouth daily.   clotrimazole-betamethasone cream Commonly known as: LOTRISONE Apply topically 2 (two) times daily.   dicyclomine 10 MG capsule Commonly known as: BENTYL Take 1 capsule (10 mg total) by mouth 3 (three) times daily as needed for up to 3 days for spasms.   naproxen 500 MG tablet Commonly known as: NAPROSYN SMARTSIG:1 Tablet(s) By Mouth Every 12 Hours PRN   Nikki 3-0.02 MG tablet Generic drug:  drospirenone-ethinyl estradiol Take 1 tablet by mouth daily.   ondansetron 4 MG disintegrating tablet Commonly known as: ZOFRAN-ODT Take 1 tablet (4 mg total) by mouth every 8 (eight) hours as needed for nausea or vomiting.   oseltamivir 75 MG capsule Commonly known as: TAMIFLU Take 1 capsule (75 mg total) by mouth 2 (two) times daily for 3 days.   pantoprazole 40 MG tablet Commonly known as: PROTONIX Take 1 tablet (40 mg total) by mouth daily for 14 days.   predniSONE 10 MG tablet Commonly known as: DELTASONE Take 3 tablets (30 mg total) by mouth daily with breakfast for 3 days.    prochlorperazine 10 MG tablet Commonly known as: COMPAZINE Take 1 tablet (10 mg total) by mouth every 8 (eight) hours as needed for nausea or vomiting.   SUMAtriptan 100 MG tablet Commonly known as: Imitrex Take 1 tablet (100 mg total) by mouth every 2 (two) hours as needed for up to 120 doses for migraine. May repeat in 2 hours if headache persists or recurs.   valACYclovir 1000 MG tablet Commonly known as: VALTREX Take 1,000 mg by mouth 3 (three) times daily. What changed: Another medication with the same name was removed. Continue taking this medication, and follow the directions you see here.        Follow-up Information     [name], MD.   Specialty: Family Medicine Contact information: [place] [address] [address] [facility].   Specialty: Emergency Medicine Why: As needed Contact information: [place] [phone]                 Time coordinating discharge: 39 minutes  Signed:  Aedyn Mckeon  [facility] 09/24/2022, 2:16 PM

## 2022-09-26 LAB — CULTURE, BLOOD (ROUTINE X 2)
Culture: NO GROWTH
Culture: NO GROWTH
Special Requests: ADEQUATE
Special Requests: ADEQUATE

## 2022-09-27 LAB — CULTURE, BLOOD (ROUTINE X 2)
Culture: NO GROWTH
Culture: NO GROWTH

## 2022-09-29 DIAGNOSIS — R000 Tachycardia, unspecified: Secondary | ICD-10-CM | POA: Diagnosis not present

## 2022-09-29 DIAGNOSIS — Z130 Encounter for screening for diseases of the blood and blood-forming organs and certain disorders involving the immune mechanism: Secondary | ICD-10-CM | POA: Diagnosis not present

## 2022-09-29 DIAGNOSIS — N809 Endometriosis, unspecified: Secondary | ICD-10-CM | POA: Diagnosis not present

## 2022-09-29 DIAGNOSIS — Z3040 Encounter for surveillance of contraceptives, unspecified: Secondary | ICD-10-CM | POA: Diagnosis not present

## 2022-09-29 DIAGNOSIS — N939 Abnormal uterine and vaginal bleeding, unspecified: Secondary | ICD-10-CM | POA: Diagnosis not present

## 2022-09-29 DIAGNOSIS — J101 Influenza due to other identified influenza virus with other respiratory manifestations: Secondary | ICD-10-CM | POA: Diagnosis not present

## 2022-09-29 DIAGNOSIS — N941 Unspecified dyspareunia: Secondary | ICD-10-CM | POA: Diagnosis not present

## 2022-10-08 ENCOUNTER — Telehealth: Payer: Self-pay | Admitting: Neurology

## 2022-10-08 ENCOUNTER — Other Ambulatory Visit: Payer: Self-pay | Admitting: Neurology

## 2022-10-08 DIAGNOSIS — L853 Xerosis cutis: Secondary | ICD-10-CM | POA: Diagnosis not present

## 2022-10-08 DIAGNOSIS — K130 Diseases of lips: Secondary | ICD-10-CM | POA: Diagnosis not present

## 2022-10-08 DIAGNOSIS — L700 Acne vulgaris: Secondary | ICD-10-CM | POA: Diagnosis not present

## 2022-10-08 IMAGING — US US OB TRANSVAGINAL
1 series · 15 of 28 positions shown · non-contrast
Comparison: Obstetric ultrasound 03/28/2021.

CLINICAL DATA: Assess fetal viability. A indeterminate recent
pelvic ultrasound. LMP 02/24/2021.

EXAM:
TRANSVAGINAL OB ULTRASOUND
TECHNIQUE: Transvaginal ultrasound was performed for complete evaluation of the
gestation as well as the maternal uterus, adnexal regions, and
pelvic cul-de-sac.

[Series 1: us ob transvaginal · 15 of 43 slices shown]
[im 1/43]
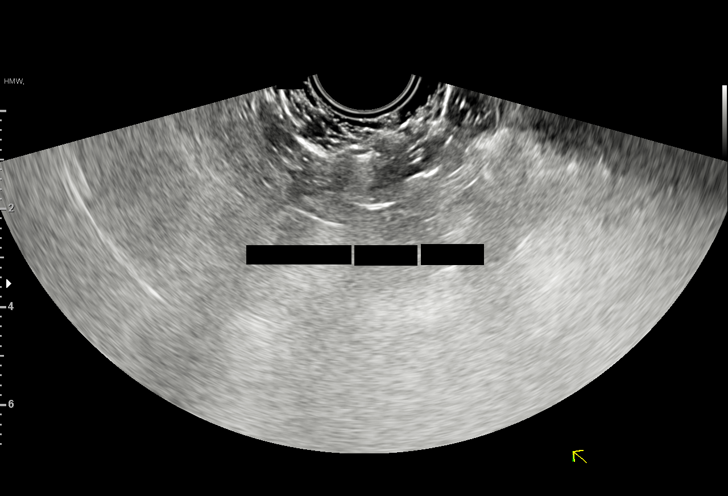
[im 4/43]
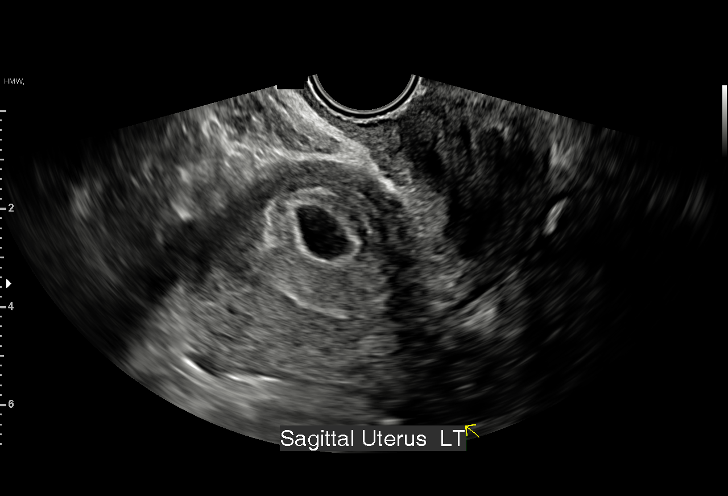
[im 7/43]
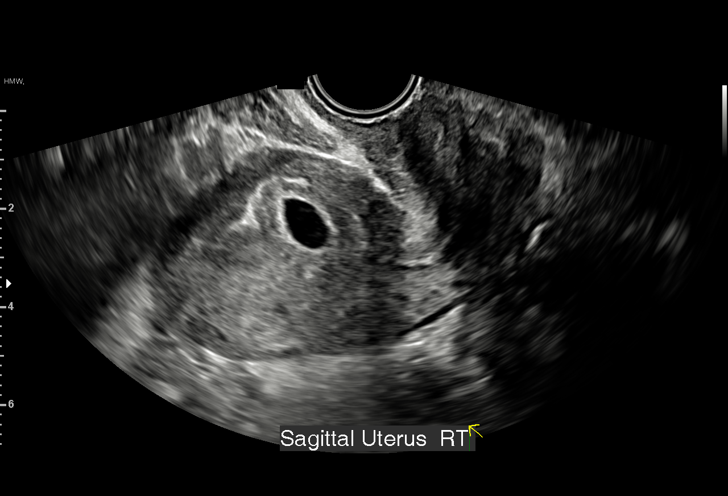
[im 10/43]
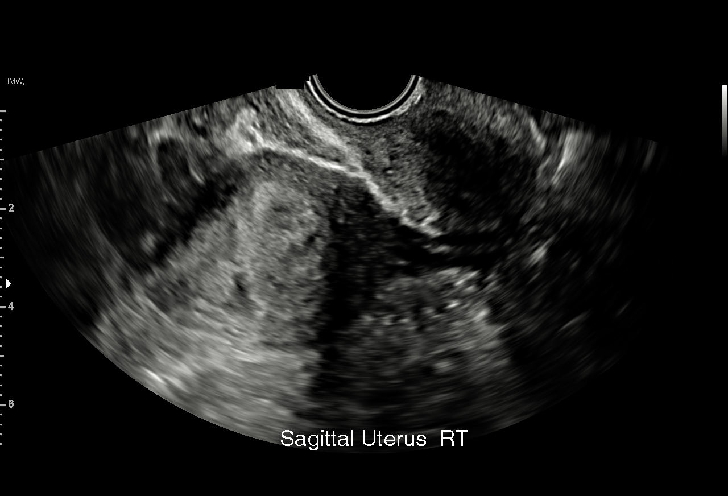
[im 13/43]
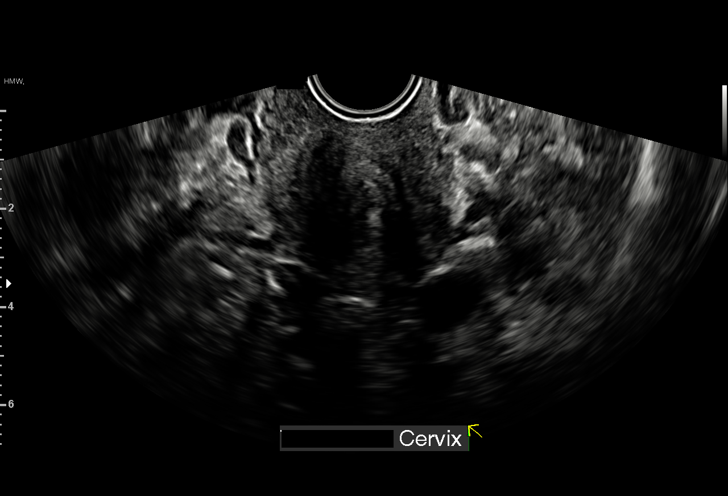
[im 16/43]
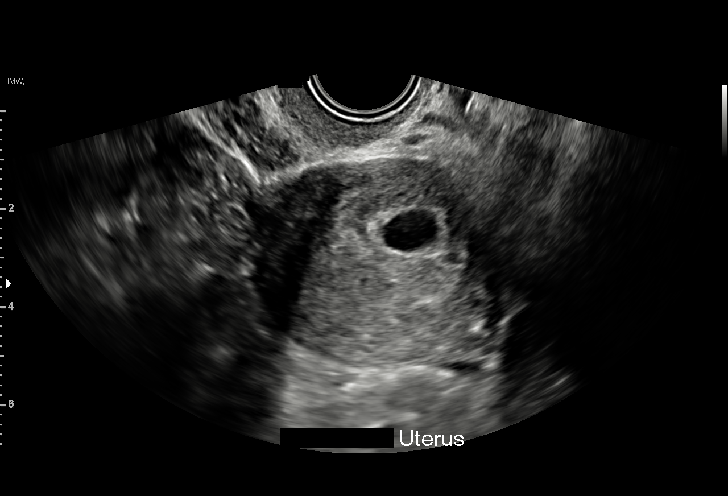
[im 19/43]
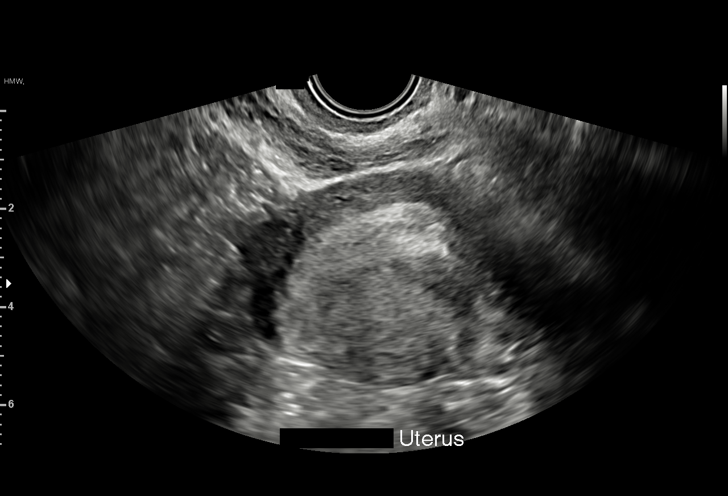
[im 22/43]
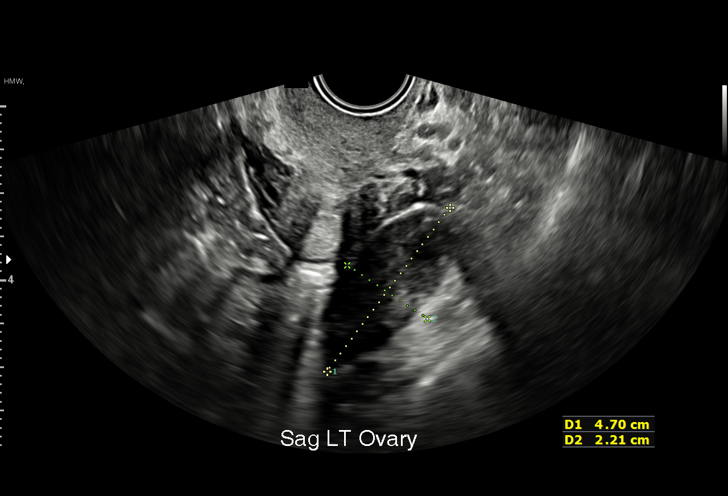
[im 24/43]
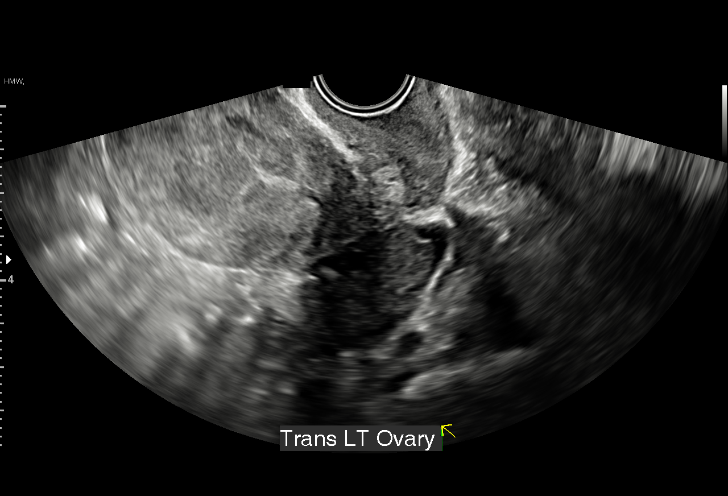
[im 27/43]
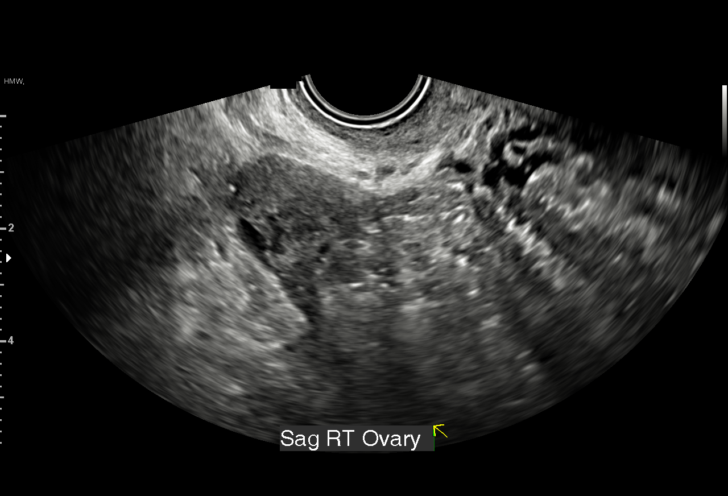
[im 30/43]
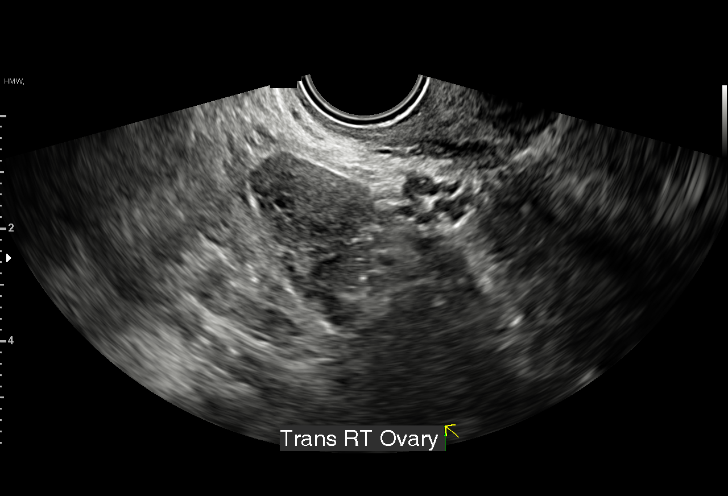
[im 33/43]
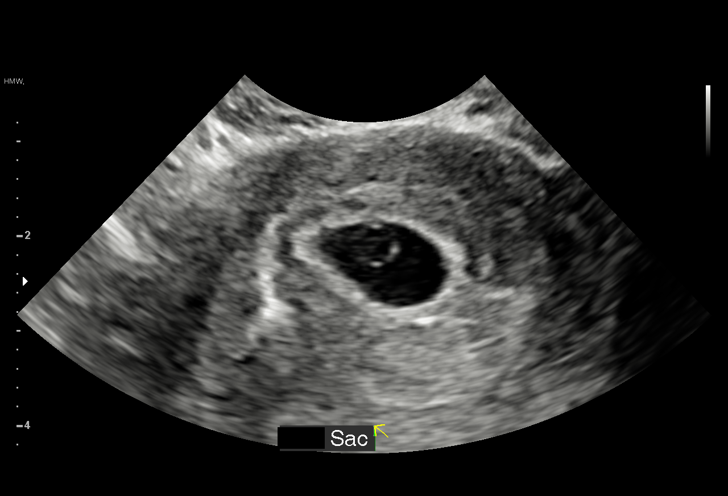
[im 36/43]
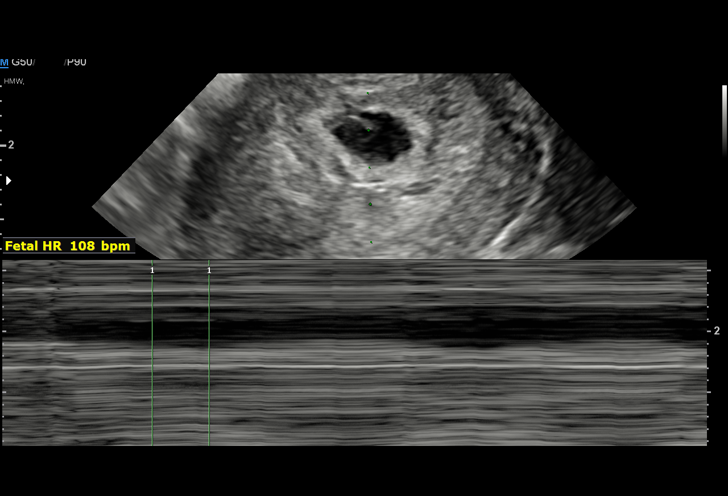
[im 39/43]
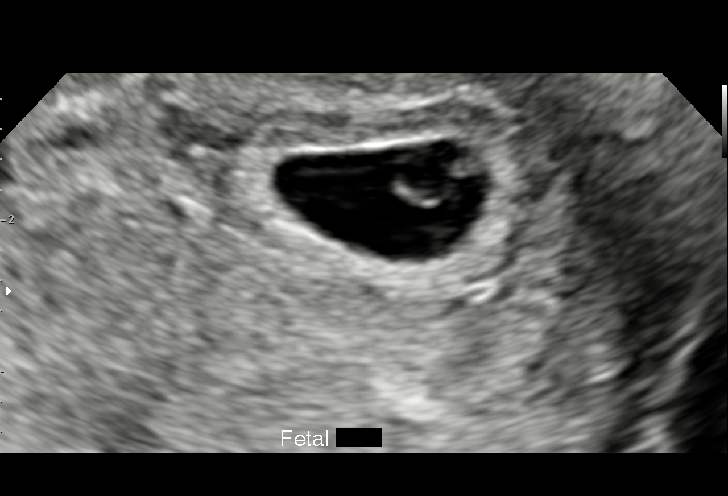
[im 43/43]
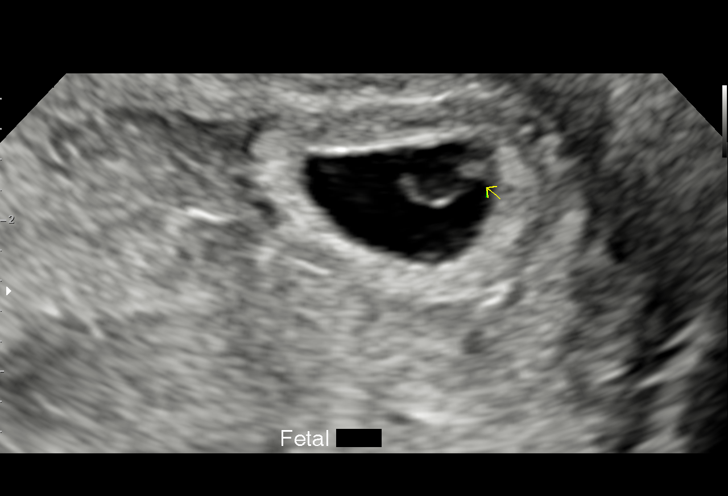

[15 of 28 positions shown; findings below may reference images not displayed]

FINDINGS: Intrauterine gestational sac: Visualized/normal in shape.

Yolk sac:  Visualized.

Embryo:  Visualized.

Cardiac Activity: Visualized.

Heart Rate: 108 bpm

CRL:  1.6 mm; too small to calculate EDC

Subchorionic hemorrhage: None.

Maternal uterus/adnexae: Both maternal ovaries are visualized. No
adnexal mass or significant free pelvic fluid.
IMPRESSION: 1. Single live intrauterine gestation is documented, too small to
calculate EDC.
2. No evidence of adnexal mass or free pelvic fluid.

## 2022-10-08 MED ORDER — METHYLPREDNISOLONE 4 MG PO TBPK: ORAL_TABLET | ORAL | 0 refills | Status: AC

## 2022-10-08 NOTE — Telephone Encounter (Signed)
Patient returned my call.  I advised her that Dr. April Manson has called in a Alisha Reynolds for her.  She will take the complete Dosepak and let us know if her migraine does not improve.

## 2022-10-08 NOTE — Telephone Encounter (Signed)
I called patient.  She reports having a migraine since Saturday.  She has tried sumatriptan 3 times.  It helps for the next 1 to 2 hours but then her migraine returns.  She has tried ibuprofen.  She reports that she has not missed any amitriptyline '50mg'$  doses at bedtime.  She reports having blurred vision and pressure behind her left eye, which usually accompanies her migraines, per her report.  She denied an interest in going to the ER for treatment because of the long wait times.  I advised her I will speak with Dr. April Manson and we will call her back with his recommendations.

## 2022-10-08 NOTE — Telephone Encounter (Signed)
I called patient to discuss.  No answer, left a voicemail asking her to call us back.

## 2022-10-08 NOTE — Telephone Encounter (Signed)
Pt reports she has taken meds for migraines, but still has migraines since Saturday of last week.  Pt also states she has noticed changes in vision(left eye) also pt states it feels like someone is sitting behind her left eye(pressure).  Pt was asked if she has or plans on going to ED.  Pt states she does not want to sit for that long period of time, please call.

## 2022-10-08 NOTE — Telephone Encounter (Signed)
Will prescribed her a Medrol dose pack.

## 2022-10-20 DIAGNOSIS — L259 Unspecified contact dermatitis, unspecified cause: Secondary | ICD-10-CM | POA: Diagnosis not present

## 2022-10-21 DIAGNOSIS — L271 Localized skin eruption due to drugs and medicaments taken internally: Secondary | ICD-10-CM | POA: Diagnosis not present

## 2022-10-21 DIAGNOSIS — T50995A Adverse effect of other drugs, medicaments and biological substances, initial encounter: Secondary | ICD-10-CM | POA: Diagnosis not present

## 2022-10-21 DIAGNOSIS — H6122 Impacted cerumen, left ear: Secondary | ICD-10-CM | POA: Diagnosis not present

## 2022-10-21 DIAGNOSIS — E010 Iodine-deficiency related diffuse (endemic) goiter: Secondary | ICD-10-CM | POA: Diagnosis not present

## 2022-10-22 ENCOUNTER — Other Ambulatory Visit: Payer: Self-pay | Admitting: Physician Assistant

## 2022-10-22 DIAGNOSIS — E010 Iodine-deficiency related diffuse (endemic) goiter: Secondary | ICD-10-CM

## 2022-10-22 DIAGNOSIS — R300 Dysuria: Secondary | ICD-10-CM | POA: Diagnosis not present

## 2022-10-22 DIAGNOSIS — N898 Other specified noninflammatory disorders of vagina: Secondary | ICD-10-CM | POA: Diagnosis not present

## 2022-10-26 ENCOUNTER — Other Ambulatory Visit: Payer: Self-pay

## 2022-10-26 ENCOUNTER — Other Ambulatory Visit: Payer: Self-pay | Admitting: Neurology

## 2022-10-26 MED ORDER — AMITRIPTYLINE HCL 50 MG PO TABS: 50.0000 mg | ORAL_TABLET | Freq: Every day | ORAL | 3 refills | Status: DC

## 2022-10-26 NOTE — Progress Notes (Signed)
Rx sent for 90 day supply.

## 2022-11-13 DIAGNOSIS — L853 Xerosis cutis: Secondary | ICD-10-CM | POA: Diagnosis not present

## 2022-11-13 DIAGNOSIS — K130 Diseases of lips: Secondary | ICD-10-CM | POA: Diagnosis not present

## 2022-11-13 DIAGNOSIS — L700 Acne vulgaris: Secondary | ICD-10-CM | POA: Diagnosis not present

## 2022-11-20 ENCOUNTER — Ambulatory Visit
Admission: RE | Admit: 2022-11-20 | Discharge: 2022-11-20 | Disposition: A | Discharge status: Home / Self Care | Payer: 59 | Source: Ambulatory Visit | Attending: Physician Assistant | Admitting: Physician Assistant

## 2022-11-20 DIAGNOSIS — E010 Iodine-deficiency related diffuse (endemic) goiter: Secondary | ICD-10-CM | POA: Diagnosis not present

## 2022-11-20 DIAGNOSIS — K130 Diseases of lips: Secondary | ICD-10-CM | POA: Diagnosis not present

## 2022-11-20 DIAGNOSIS — L700 Acne vulgaris: Secondary | ICD-10-CM | POA: Diagnosis not present

## 2022-11-20 DIAGNOSIS — E049 Nontoxic goiter, unspecified: Secondary | ICD-10-CM | POA: Diagnosis not present

## 2022-11-20 DIAGNOSIS — L853 Xerosis cutis: Secondary | ICD-10-CM | POA: Diagnosis not present

## 2022-11-24 DIAGNOSIS — Z3040 Encounter for surveillance of contraceptives, unspecified: Secondary | ICD-10-CM | POA: Diagnosis not present

## 2022-11-24 DIAGNOSIS — R635 Abnormal weight gain: Secondary | ICD-10-CM | POA: Diagnosis not present

## 2022-11-24 DIAGNOSIS — N809 Endometriosis, unspecified: Secondary | ICD-10-CM | POA: Diagnosis not present

## 2022-11-24 DIAGNOSIS — N939 Abnormal uterine and vaginal bleeding, unspecified: Secondary | ICD-10-CM | POA: Diagnosis not present

## 2022-11-26 DIAGNOSIS — E7800 Pure hypercholesterolemia, unspecified: Secondary | ICD-10-CM | POA: Diagnosis not present

## 2022-12-10 ENCOUNTER — Encounter: Payer: Self-pay | Admitting: Neurology

## 2022-12-10 ENCOUNTER — Ambulatory Visit (INDEPENDENT_AMBULATORY_CARE_PROVIDER_SITE_OTHER): Payer: 59 | Admitting: Neurology

## 2022-12-10 VITALS — BP 118/78 | Ht 61.0 in | Wt 162.0 lb

## 2022-12-10 DIAGNOSIS — G43E01 Chronic migraine with aura, not intractable, with status migrainosus: Secondary | ICD-10-CM | POA: Diagnosis not present

## 2022-12-10 MED ORDER — NURTEC 75 MG PO TBDP: 1.0000 | ORAL_TABLET | ORAL | 0 refills | Status: DC | PRN

## 2022-12-10 MED ORDER — RIZATRIPTAN BENZOATE 10 MG PO TBDP: 10.0000 mg | ORAL_TABLET | ORAL | 11 refills | Status: AC | PRN
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
## 2022-12-10 NOTE — Patient Instructions (Addendum)
Discontinue sumatriptan Start rizatriptan 10 mg as needed for migraines, can combine it with Aleve If rizatriptan not helpful, we will try her on Nurtec.  (Samples of Nurtec given to patient)  Follow-up in 6 months or sooner if worse.

## 2022-12-15 ENCOUNTER — Ambulatory Visit (INDEPENDENT_AMBULATORY_CARE_PROVIDER_SITE_OTHER): Payer: 59 | Admitting: Family Medicine

## 2022-12-15 ENCOUNTER — Encounter (INDEPENDENT_AMBULATORY_CARE_PROVIDER_SITE_OTHER): Payer: Self-pay | Admitting: Family Medicine

## 2022-12-15 VITALS — BP 112/77 | HR 90 | Temp 98.4°F | Ht 61.0 in | Wt 159.0 lb

## 2022-12-15 DIAGNOSIS — F39 Unspecified mood [affective] disorder: Secondary | ICD-10-CM

## 2022-12-15 DIAGNOSIS — F332 Major depressive disorder, recurrent severe without psychotic features: Secondary | ICD-10-CM | POA: Diagnosis not present

## 2022-12-15 DIAGNOSIS — E559 Vitamin D deficiency, unspecified: Secondary | ICD-10-CM

## 2022-12-15 DIAGNOSIS — Z6830 Body mass index (BMI) 30.0-30.9, adult: Secondary | ICD-10-CM | POA: Diagnosis not present

## 2022-12-15 DIAGNOSIS — E669 Obesity, unspecified: Secondary | ICD-10-CM
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
## 2022-12-18 DIAGNOSIS — K130 Diseases of lips: Secondary | ICD-10-CM | POA: Diagnosis not present

## 2022-12-18 DIAGNOSIS — L853 Xerosis cutis: Secondary | ICD-10-CM | POA: Diagnosis not present

## 2022-12-18 DIAGNOSIS — L700 Acne vulgaris: Secondary | ICD-10-CM | POA: Diagnosis not present

## 2022-12-22 ENCOUNTER — Encounter (HOSPITAL_BASED_OUTPATIENT_CLINIC_OR_DEPARTMENT_OTHER): Payer: Self-pay | Admitting: Obstetrics and Gynecology

## 2022-12-22 NOTE — Progress Notes (Addendum)
Spoke w/ via phone for pre-op interview--- Alisha Reynolds Lab needs dos----  UPT per anesthesia. Surgeon orders pending.            Lab results------ Current EKG in Epic dated 09/22/22. COVID test -----patient states asymptomatic no test needed Arrive at -------0645 NPO after MN NO Solid Food.   Med rec completed Medications to take morning of surgery ----- Albuterol-bring and Valtrex, migraine meds PRN Diabetic medication ----- Patient instructed no nail polish to be worn day of surgery Patient instructed to bring photo id and insurance card day of surgery Patient aware to have Driver (ride ) / caregiver Mother Alisha Reynolds   for 24 hours after surgery  Patient Special Instructions ----- Pre-Op special Instructions ----- Patient verbalized understanding of instructions that were given at this phone interview. Patient denies shortness of breath, chest pain, fever, cough at this phone interview.

## 2022-12-31 DIAGNOSIS — Z0189 Encounter for other specified special examinations: Secondary | ICD-10-CM | POA: Diagnosis not present
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
## 2023-01-08 ENCOUNTER — Encounter (HOSPITAL_BASED_OUTPATIENT_CLINIC_OR_DEPARTMENT_OTHER): Payer: Self-pay | Admitting: Obstetrics and Gynecology

## 2023-01-08 ENCOUNTER — Other Ambulatory Visit: Payer: Self-pay

## 2023-01-08 ENCOUNTER — Encounter (HOSPITAL_COMMUNITY): Payer: Self-pay

## 2023-01-08 ENCOUNTER — Ambulatory Visit (HOSPITAL_BASED_OUTPATIENT_CLINIC_OR_DEPARTMENT_OTHER): Payer: 59 | Admitting: Anesthesiology

## 2023-01-08 ENCOUNTER — Inpatient Hospital Stay (HOSPITAL_BASED_OUTPATIENT_CLINIC_OR_DEPARTMENT_OTHER)
Admission: RE | Admit: 2023-01-08 | Discharge: 2023-01-10 | DRG: 876 | Disposition: A | Discharge status: Home / Self Care | Payer: 59 | Source: Ambulatory Visit | Attending: Internal Medicine | Admitting: Internal Medicine

## 2023-01-08 ENCOUNTER — Observation Stay (HOSPITAL_COMMUNITY): Admission: AD | Admit: 2023-01-08 | Payer: 59 | Source: Ambulatory Visit

## 2023-01-08 ENCOUNTER — Encounter (HOSPITAL_COMMUNITY)
Admission: RE | Disposition: A | Discharge status: Home / Self Care | Payer: Self-pay | Source: Home / Self Care | Attending: Internal Medicine

## 2023-01-08 ENCOUNTER — Observation Stay (HOSPITAL_COMMUNITY): Payer: 59

## 2023-01-08 DIAGNOSIS — Z658 Other specified problems related to psychosocial circumstances: Secondary | ICD-10-CM

## 2023-01-08 DIAGNOSIS — N939 Abnormal uterine and vaginal bleeding, unspecified: Secondary | ICD-10-CM | POA: Diagnosis present

## 2023-01-08 DIAGNOSIS — N809 Endometriosis, unspecified: Secondary | ICD-10-CM | POA: Diagnosis not present

## 2023-01-08 DIAGNOSIS — Z96649 Presence of unspecified artificial hip joint: Secondary | ICD-10-CM | POA: Diagnosis present

## 2023-01-08 DIAGNOSIS — Z8701 Personal history of pneumonia (recurrent): Secondary | ICD-10-CM | POA: Diagnosis not present

## 2023-01-08 DIAGNOSIS — G43909 Migraine, unspecified, not intractable, without status migrainosus: Secondary | ICD-10-CM | POA: Diagnosis not present

## 2023-01-08 DIAGNOSIS — G253 Myoclonus: Secondary | ICD-10-CM | POA: Diagnosis not present

## 2023-01-08 DIAGNOSIS — Z91018 Allergy to other foods: Secondary | ICD-10-CM | POA: Diagnosis not present

## 2023-01-08 DIAGNOSIS — N938 Other specified abnormal uterine and vaginal bleeding: Secondary | ICD-10-CM | POA: Diagnosis present

## 2023-01-08 DIAGNOSIS — D649 Anemia, unspecified: Secondary | ICD-10-CM | POA: Diagnosis not present

## 2023-01-08 DIAGNOSIS — R569 Unspecified convulsions: Secondary | ICD-10-CM | POA: Diagnosis not present

## 2023-01-08 DIAGNOSIS — F419 Anxiety disorder, unspecified: Secondary | ICD-10-CM

## 2023-01-08 DIAGNOSIS — Z91014 Allergy to mammalian meats: Secondary | ICD-10-CM

## 2023-01-08 DIAGNOSIS — N840 Polyp of corpus uteri: Secondary | ICD-10-CM | POA: Diagnosis not present

## 2023-01-08 DIAGNOSIS — Z825 Family history of asthma and other chronic lower respiratory diseases: Secondary | ICD-10-CM

## 2023-01-08 DIAGNOSIS — J45909 Unspecified asthma, uncomplicated: Secondary | ICD-10-CM | POA: Diagnosis present

## 2023-01-08 DIAGNOSIS — Z01818 Encounter for other preprocedural examination: Principal | ICD-10-CM

## 2023-01-08 DIAGNOSIS — Z733 Stress, not elsewhere classified: Secondary | ICD-10-CM | POA: Diagnosis not present

## 2023-01-08 DIAGNOSIS — Z885 Allergy status to narcotic agent status: Secondary | ICD-10-CM

## 2023-01-08 DIAGNOSIS — Z87892 Personal history of anaphylaxis: Secondary | ICD-10-CM | POA: Diagnosis not present

## 2023-01-08 DIAGNOSIS — Z630 Problems in relationship with spouse or partner: Secondary | ICD-10-CM | POA: Diagnosis not present

## 2023-01-08 DIAGNOSIS — F445 Conversion disorder with seizures or convulsions: Principal | ICD-10-CM | POA: Diagnosis present

## 2023-01-08 HISTORY — DX: Nontoxic single thyroid nodule: E04.1

## 2023-01-08 HISTORY — PX: HYSTEROSCOPY WITH D & C: SHX1775

## 2023-01-08 LAB — COMPREHENSIVE METABOLIC PANEL
ALT: 21 U/L (ref 0–44)
AST: 27 U/L (ref 15–41)
Albumin: 4.5 g/dL (ref 3.5–5.0)
Alkaline Phosphatase: 98 U/L (ref 38–126)
Anion gap: 11 (ref 5–15)
BUN: 13 mg/dL (ref 6–20)
CO2: 23 mmol/L (ref 22–32)
Calcium: 9.3 mg/dL (ref 8.9–10.3)
Chloride: 104 mmol/L (ref 98–111)
Creatinine, Ser: 0.89 mg/dL (ref 0.44–1.00)
GFR, Estimated: >60 mL/min (ref >60)
Glucose, Bld: 94 mg/dL (ref 70–99)
Potassium: 3.8 mmol/L (ref 3.5–5.1)
Sodium: 138 mmol/L (ref 135–145)
Total Bilirubin: 0.5 mg/dL (ref 0.3–1.2)
Total Protein: 8.3 g/dL — ABNORMAL HIGH (ref 6.5–8.1)

## 2023-01-08 LAB — CBC
HCT: 38.6 % (ref 36.0–46.0)
Hemoglobin: 12.7 g/dL (ref 12.0–15.0)
MCH: 28.4 pg (ref 26.0–34.0)
MCHC: 32.9 g/dL (ref 30.0–36.0)
MCV: 86.4 fL (ref 80.0–100.0)
Platelets: 297 10*3/uL (ref 150–400)
RBC: 4.47 MIL/uL (ref 3.87–5.11)
RDW: 14.8 % (ref 11.5–15.5)
WBC: 9.9 10*3/uL (ref 4.0–10.5)
nRBC: 0 % (ref 0.0–0.2)

## 2023-01-08 LAB — GLUCOSE, CAPILLARY: Glucose-Capillary: 124 mg/dL — ABNORMAL HIGH (ref 70–99)

## 2023-01-08 LAB — POCT PREGNANCY, URINE: Preg Test, Ur: NEGATIVE

## 2023-01-08 LAB — MRSA NEXT GEN BY PCR, NASAL: MRSA by PCR Next Gen: NOT DETECTED

## 2023-01-08 SURGERY — DILATATION AND CURETTAGE /HYSTEROSCOPY
Anesthesia: General | Site: Uterus

## 2023-01-08 MED ORDER — LACTATED RINGERS IV SOLN: INTRAVENOUS | Status: DC

## 2023-01-08 MED ORDER — VALACYCLOVIR HCL 500 MG PO TABS
1000.0000 mg | ORAL_TABLET | Freq: Every day | ORAL | Status: DC
  Administered 2023-01-09 – 2023-01-10 (×2): 1000 mg via ORAL
  Filled 2023-01-08 (×2): qty 2

## 2023-01-08 MED ORDER — ENOXAPARIN SODIUM 40 MG/0.4ML IJ SOSY
40.0000 mg | PREFILLED_SYRINGE | INTRAMUSCULAR | Status: DC
  Administered 2023-01-09: 40 mg via SUBCUTANEOUS
  Filled 2023-01-08: qty 0.4

## 2023-01-08 MED ORDER — LIDOCAINE 2% (20 MG/ML) 5 ML SYRINGE
INTRAMUSCULAR | Status: DC | PRN
  Administered 2023-01-08: 100 mg via INTRAVENOUS

## 2023-01-08 MED ORDER — KETOROLAC TROMETHAMINE 30 MG/ML IJ SOLN
INTRAMUSCULAR | Status: AC
  Filled 2023-01-08: qty 1

## 2023-01-08 MED ORDER — CHLORHEXIDINE GLUCONATE CLOTH 2 % EX PADS
6.0000 | MEDICATED_PAD | Freq: Every day | CUTANEOUS | Status: DC
  Administered 2023-01-08: 6 via TOPICAL

## 2023-01-08 MED ORDER — LORAZEPAM 2 MG/ML IJ SOLN: 4.0000 mg | INTRAMUSCULAR | Status: DC | PRN

## 2023-01-08 MED ORDER — ORAL CARE MOUTH RINSE: 15.0000 mL | OROMUCOSAL | Status: DC

## 2023-01-08 MED ORDER — ONDANSETRON HCL 4 MG PO TABS: 4.0000 mg | ORAL_TABLET | Freq: Four times a day (QID) | ORAL | Status: DC | PRN

## 2023-01-08 MED ORDER — ONDANSETRON HCL 4 MG/2ML IJ SOLN
INTRAMUSCULAR | Status: DC | PRN
  Administered 2023-01-08: 4 mg via INTRAVENOUS

## 2023-01-08 MED ORDER — DEXAMETHASONE SODIUM PHOSPHATE 10 MG/ML IJ SOLN
INTRAMUSCULAR | Status: AC
  Filled 2023-01-08: qty 1

## 2023-01-08 MED ORDER — ACETAMINOPHEN 325 MG PO TABS
650.0000 mg | ORAL_TABLET | ORAL | Status: DC | PRN
  Administered 2023-01-09 (×2): 650 mg via ORAL
  Filled 2023-01-08 (×2): qty 2

## 2023-01-08 MED ORDER — PROPOFOL 500 MG/50ML IV EMUL
INTRAVENOUS | Status: AC
  Filled 2023-01-08: qty 50

## 2023-01-08 MED ORDER — SODIUM CHLORIDE 0.9% FLUSH
3.0000 mL | Freq: Two times a day (BID) | INTRAVENOUS | Status: DC
  Administered 2023-01-08 – 2023-01-10 (×3): 3 mL via INTRAVENOUS

## 2023-01-08 MED ORDER — MIDAZOLAM HCL 2 MG/2ML IJ SOLN
INTRAMUSCULAR | Status: AC
  Filled 2023-01-08: qty 2

## 2023-01-08 MED ORDER — AMITRIPTYLINE HCL 25 MG PO TABS: 50.0000 mg | ORAL_TABLET | Freq: Every day | ORAL | Status: DC

## 2023-01-08 MED ORDER — LIDOCAINE HCL 1 % IJ SOLN
INTRAMUSCULAR | Status: DC | PRN
  Administered 2023-01-08: 10 mL

## 2023-01-08 MED ORDER — SODIUM CHLORIDE 0.9 % IR SOLN
Status: DC | PRN
  Administered 2023-01-08: 3000 mL

## 2023-01-08 MED ORDER — DEXAMETHASONE SODIUM PHOSPHATE 10 MG/ML IJ SOLN
INTRAMUSCULAR | Status: DC | PRN
  Administered 2023-01-08: 10 mg via INTRAVENOUS

## 2023-01-08 MED ORDER — FENTANYL CITRATE (PF) 100 MCG/2ML IJ SOLN: 25.0000 ug | INTRAMUSCULAR | Status: DC | PRN

## 2023-01-08 MED ORDER — ACETAMINOPHEN 650 MG RE SUPP: 650.0000 mg | RECTAL | Status: DC | PRN

## 2023-01-08 MED ORDER — PROPOFOL 10 MG/ML IV BOLUS
INTRAVENOUS | Status: DC | PRN
  Administered 2023-01-08: 140 mg via INTRAVENOUS
  Administered 2023-01-08: 20 mg via INTRAVENOUS
  Administered 2023-01-08: 100 mg via INTRAVENOUS
  Administered 2023-01-08: 25 ug/kg/min via INTRAVENOUS
  Administered 2023-01-08: 50 mg via INTRAVENOUS

## 2023-01-08 MED ORDER — SODIUM CHLORIDE 0.9 % IV SOLN: 20.0000 mg/kg | Freq: Once | INTRAVENOUS | Status: DC

## 2023-01-08 MED ORDER — KETOROLAC TROMETHAMINE 30 MG/ML IJ SOLN
INTRAMUSCULAR | Status: DC | PRN
  Administered 2023-01-08: 30 mg via INTRAVENOUS

## 2023-01-08 MED ORDER — LEVETIRACETAM IN NACL 1500 MG/100ML IV SOLN
1500.0000 mg | Freq: Once | INTRAVENOUS | Status: AC
  Administered 2023-01-08: 1500 mg via INTRAVENOUS
  Filled 2023-01-08: qty 100

## 2023-01-08 MED ORDER — LIDOCAINE HCL (PF) 2 % IJ SOLN
INTRAMUSCULAR | Status: AC
  Filled 2023-01-08: qty 5

## 2023-01-08 MED ORDER — FENTANYL CITRATE (PF) 100 MCG/2ML IJ SOLN
INTRAMUSCULAR | Status: AC
  Filled 2023-01-08: qty 2

## 2023-01-08 MED ORDER — CHLORHEXIDINE GLUCONATE 0.12% ORAL RINSE (MEDLINE KIT)
15.0000 mL | Freq: Two times a day (BID) | OROMUCOSAL | Status: DC
  Administered 2023-01-08: 15 mL via OROMUCOSAL

## 2023-01-08 MED ORDER — FENTANYL CITRATE (PF) 100 MCG/2ML IJ SOLN
INTRAMUSCULAR | Status: DC | PRN
  Administered 2023-01-08: 50 ug via INTRAVENOUS

## 2023-01-08 MED ORDER — ACETAMINOPHEN 160 MG/5ML PO SOLN: 1000.0000 mg | Freq: Once | ORAL | Status: DC | PRN

## 2023-01-08 MED ORDER — ACETAMINOPHEN 10 MG/ML IV SOLN: 1000.0000 mg | Freq: Once | INTRAVENOUS | Status: DC | PRN

## 2023-01-08 MED ORDER — LACTATED RINGERS IV SOLN: INTRAVENOUS | Status: DC | PRN

## 2023-01-08 MED ORDER — HYDRALAZINE HCL 20 MG/ML IJ SOLN: 5.0000 mg | INTRAMUSCULAR | Status: DC | PRN

## 2023-01-08 MED ORDER — ACETAMINOPHEN 500 MG PO TABS: 1000.0000 mg | ORAL_TABLET | Freq: Once | ORAL | Status: DC | PRN

## 2023-01-08 MED ORDER — NAPROXEN 500 MG PO TABS: ORAL_TABLET | ORAL | 1 refills | Status: DC

## 2023-01-08 MED ORDER — MIDAZOLAM HCL 5 MG/5ML IJ SOLN
INTRAMUSCULAR | Status: DC | PRN
  Administered 2023-01-08: .5 mg via INTRAVENOUS
  Administered 2023-01-08: 2 mg via INTRAVENOUS
  Administered 2023-01-08: 1.5 mg via INTRAVENOUS
  Administered 2023-01-08 (×2): 1 mg via INTRAVENOUS

## 2023-01-08 MED ORDER — ONDANSETRON HCL 4 MG/2ML IJ SOLN
INTRAMUSCULAR | Status: AC
  Filled 2023-01-08: qty 2

## 2023-01-08 MED ORDER — ONDANSETRON HCL 4 MG/2ML IJ SOLN
4.0000 mg | Freq: Four times a day (QID) | INTRAMUSCULAR | Status: DC | PRN
  Administered 2023-01-09 – 2023-01-10 (×2): 4 mg via INTRAVENOUS
  Filled 2023-01-08 (×2): qty 2

## 2023-01-08 SURGICAL SUPPLY — 19 items
CATH ROBINSON RED A/P 16FR (CATHETERS) ×1 IMPLANT
DEVICE MYOSURE LITE (MISCELLANEOUS) IMPLANT
DILATOR CANAL MILEX (MISCELLANEOUS) IMPLANT
DRSG TELFA 3X8 NADH STRL (GAUZE/BANDAGES/DRESSINGS) ×1 IMPLANT
GAUZE 4X4 16PLY ~~LOC~~+RFID DBL (SPONGE) ×2 IMPLANT
GLOVE BIO SURGEON STRL SZ 6.5 (GLOVE) ×1 IMPLANT
GOWN STRL REUS W/TWL LRG LVL3 (GOWN DISPOSABLE) ×1 IMPLANT
IV NS IRRIG 3000ML ARTHROMATIC (IV SOLUTION) IMPLANT
KIT PROCEDURE FLUENT (KITS) ×1 IMPLANT
KIT TURNOVER CYSTO (KITS) ×1 IMPLANT
LOOP CUTTING BIPOLAR 21FR (ELECTRODE) IMPLANT
PACK VAGINAL MINOR WOMEN LF (CUSTOM PROCEDURE TRAY) ×1 IMPLANT
PAD OB MATERNITY 4.3X12.25 (PERSONAL CARE ITEMS) ×1 IMPLANT
PAD PREP 24X48 CUFFED NSTRL (MISCELLANEOUS) ×1 IMPLANT
SEAL ROD LENS SCOPE MYOSURE (ABLATOR) ×1 IMPLANT
SLEEVE SCD COMPRESS KNEE MED (STOCKING) ×1 IMPLANT
SYR 20ML LL LF (SYRINGE) IMPLANT
TOWEL OR 17X24 6PK STRL BLUE (TOWEL DISPOSABLE) ×2 IMPLANT
WATER STERILE IRR 500ML POUR (IV SOLUTION) ×1 IMPLANT
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
## 2023-01-08 NOTE — Anesthesia Procedure Notes (Signed)
Procedure Name: LMA Insertion Date/Time: 01/08/2023 8:50 AM  Performed by: Briant Sites, CRNAPre-anesthesia Checklist: Patient identified, Emergency Drugs available, Suction available and Patient being monitored Patient Re-evaluated:Patient Re-evaluated prior to induction Oxygen Delivery Method: Circle system utilized Preoxygenation: Pre-oxygenation with 100% oxygen Induction Type: IV induction Ventilation: Mask ventilation without difficulty LMA: LMA inserted LMA Size: 4.0 Number of attempts: 1 Airway Equipment and Method: Bite block Placement Confirmation: positive ETCO2 Tube secured with: Tape Dental Injury: Teeth and Oropharynx as per pre-operative assessment

## 2023-01-08 NOTE — Transfer of Care (Signed)
Immediate Anesthesia Transfer of Care Note  Patient: Alisha Reynolds  Procedure(s) Performed: Procedure(s) (LRB): DILATATION AND CURETTAGE /HYSTEROSCOPY and MYOSURE (N/A)  Patient Location: PACU  Anesthesia Type: General  Level of Consciousness: See note in anesthesia record  Airway & Oxygen Therapy: Patient Spontanous Breathing and Patient connected to face mask oxygen  Post-op Assessment: Report given to Care Link nurse  Post vital signs: Stable Vital Signs  Complications: Transferred to [facility] ICU via carelink  Last Vitals:  Vitals Value Taken Time  BP 136/71 01/08/23 1303  Temp 37.3 C 01/08/23 1303  Pulse 112 01/08/23 1303  Resp 19 01/08/23 1303  SpO2 100 % 01/08/23 1303    Last Pain:  Vitals:   01/08/23 1303  TempSrc: Axillary  PainSc:       Patients Stated Pain Goal: 3 (01/08/23 0711)

## 2023-01-08 NOTE — H&P (Signed)
History and Physical    Patient: Alisha Reynolds:[id number] DOB: Aug 27, 1995 DOA: 01/08/2023 DOS: the patient was seen and examined on 01/08/2023 PCP: Mila Palmer, MD  Patient coming from: Home - lives alone; NOK: Mother, Leane Para, [phone]   Chief Complaint: myoclonic jerks  HPI: Alisha Reynolds is a 27 y.o. female with medical history significant of dysmenorrhea presenting with myoclonic jerking following D&C, hysteroscopy, and Myosure today.   History obtained from her mother at the bedside.  She has been sedated since arrival.  During previous surgery for ectopic she had similar post-op course with non-epileptic convulsions documented on EEG in 04/2021.  Her mother reports significant life stressors, relationship difficulties and thinks her current presentation may also be non-epileptic in nature.      ER Course:  [facility] transfer:  Patient was noted to have myoclonic activity during procedure and needs continuous EEG. 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 Seen for this in the past - routine EEG negative, non-epileptic spell. Had another episode post-operatively today. Given persistent spells despite provocative maneuvers, needs LTM to characterize events.      Review of Systems: unable to review all systems due to the inability of the patient to answer questions. Past Medical History:  Diagnosis Date   Anemia 10/30/2011   Asthma    Dysmenorrhea 10/30/2011   Thyroid nodule    Past Surgical History:  Procedure Laterality Date   DILATION AND EVACUATION  11/26/2020   Procedure: DILATATION AND EVACUATION;  Surgeon: Steva Ready, DO;  Location: [facility];  Service: Gynecology;;   DILATION AND EVACUATION N/A 04/30/2021   Procedure: DILATATION AND EVACUATION;  Surgeon: Essie Hart, MD;  Location: [facility] OR;  Service: Gynecology;  Laterality: N/A;   HIP SURGERY     LAPAROSCOPY N/A 03/08/2020   Procedure: LAPAROSCOPY DIAGNOSTIC;  Surgeon: Diamantina Monks, MD;  Location:  [facility] OR;  Service: General;  Laterality: N/A;   LAPAROSCOPY Left 11/26/2020   Procedure: DIAGNOSTIC LAPAROSCOPY WITH BIOPSY OF LEFT OVARIAN LESION;  Surgeon: Steva Ready, DO;  Location: [facility];  Service: Gynecology;  Laterality: Left;   LAPAROTOMY  03/08/2020   Procedure: EXPLORATORY LAPAROTOMY, REPAIR OF MESENTERY, ABDOMINAL WASHOUT;  Surgeon: Diamantina Monks, MD;  Location: [facility];  Service: General;;   TONSILLECTOMY  09/28/14   WISDOM TOOTH EXTRACTION     Social History:  reports that she has never smoked. She has never used smokeless tobacco. She reports current alcohol use. She reports that she does not currently use drugs.  Allergies  Allergen Reactions   Other Anaphylaxis    All types of nuts   Peanut-Containing Drug Products Anaphylaxis    All types of nuts.   Oxycodone Other (See Comments)    Hallucinations   Tramadol Other (See Comments)    Throat and tongue swelled up   Pork-Derived Products Hives and Rash    Family History  Problem Relation Age of Onset   Cancer - Colon Maternal Grandmother    Migraines Mother    Asthma Mother    Allergic rhinitis Mother    Autism spectrum disorder Other        Younger half-Brother   Food Allergy Brother        SEAFOOD   Food Allergy Brother        SEAFOOD    Prior to Admission medications   Medication Sig Start Date End Date Taking? Authorizing Provider  albuterol (VENTOLIN HFA) 108 (90 Base) MCG/ACT inhaler Inhale 2 puffs into the lungs  every 6 (six) hours as needed for wheezing or shortness of breath. 09/24/22  Yes Pokhrel, Laxman, MD  Cholecalciferol (VITAMIN D3) 50 MCG (2000 UT) capsule Take 2,000 Units by mouth daily.   Yes [provider]  CLARAVIS 40 MG capsule Take 40 mg by mouth daily. 09/11/22  Yes [provider]  Rimegepant Sulfate (NURTEC) 75 MG TBDP Take 1 tablet (75 mg total) by mouth as needed (for migraines prn). 12/10/22  Yes Camara, Amalia Hailey, MD  valACYclovir (VALTREX) 1000 MG tablet Take 1,000 mg by  mouth daily. 09/14/22  Yes [provider]  amitriptyline (ELAVIL) 50 MG tablet Take 1 tablet (50 mg total) by mouth at bedtime. 10/26/22   Windell Norfolk, MD  dicyclomine (BENTYL) 10 MG capsule Take 1 capsule (10 mg total) by mouth 3 (three) times daily as needed for up to 3 days for spasms. 09/24/22 09/27/22  Pokhrel, Rebekah Chesterfield, MD  naproxen (NAPROSYN) 500 MG tablet SMARTSIG:1 Tablet(s) By Mouth Every 12 Hours PRN 01/08/23   Bovard-Stuckert, Augusto Gamble, MD  ondansetron (ZOFRAN-ODT) 4 MG disintegrating tablet Take 1 tablet (4 mg total) by mouth every 8 (eight) hours as needed for nausea or vomiting. 08/27/22   Windell Norfolk, MD  rizatriptan (MAXALT-MLT) 10 MG disintegrating tablet Take 1 tablet (10 mg total) by mouth as needed for migraine. May repeat in 2 hours if needed 12/10/22   Windell Norfolk, MD    Physical Exam: Vitals:   01/08/23 1300 01/08/23 1303 01/08/23 1500 01/08/23 1600  BP:  136/71 131/74 130/75  Pulse: (!) 110 (!) 112 (!) 110 97  Resp: (!) 25 19 18 19   Temp:  99.1 F (37.3 C)    TempSrc:  Axillary    SpO2: 99% 100% 97% 97%  Weight:      Height:       General:  Appears calm and comfortable and is in NAD, sleeping throughout, EEG in place Eyes:  EOMI, normal lids, iris ENT:  grossly normal hearing, lips & tongue, mmm Neck:  no LAD, masses or thyromegaly Cardiovascular:  RRR, no m/r/g. No LE edema.  Respiratory:   CTA bilaterally with no wheezes/rales/rhonchi.  Normal respiratory effort. Abdomen:  soft, NT, ND Skin:  no rash or induration seen on limited exam Musculoskeletal:   no bony abnormality Psychiatric: sedated without unprovoked response Neurologic:  unable to perform   Radiological Exams on Admission: Independently reviewed - see discussion in A/P where applicable  No results found.  EKG: none   Labs on Admission: I have personally reviewed the available labs and imaging studies at the time of the admission.  Pertinent labs:    Unremarkable CMP Normal  CBC Upreg negative   Assessment and Plan: Principal Problem:   Myoclonia Active Problems:   Abnormal uterine bleeding   Psychosocial stressors    Myoclonia -Patient with myoclonic jerks post-operatively -Anesthesia was concerned about seizures -She has h/o similar presentation after previous surgery, was non-epileptic in nature -She also has h/o hypersomnolence following anesthesia; it may be worth considering spinal anesthesia in the future if possible -She will be observed on continuous EEG -Neurology consulted -No AEDs for now - hoping  -She also may need driving restriction for at least 6 months -Seizure precautions -Ativan prn   Abnormal uterine bleeding -Patient with AUB -Underwent D&C/hysteroscopy and Myosure today  Psychosocial stressors -She may benefit from psychiatric/psychologic support, based on history provided by her mother -Continue amitriptyline    Advance Care Planning:   Code Status: Full Code   Consults:  Neurology; GYN  DVT Prophylaxis: Lovenox  Family Communication: Mother was present throughout evaluation and provided entire history  Severity of Illness: Admit - It is my clinical opinion that admission to INPATIENT is reasonable and necessary because of the expectation that this patient will require hospital care that crosses at least 2 midnights to treat this condition based on the medical complexity of the problems presented.  Given the aforementioned information, the predictability of an adverse outcome is felt to be significant.   Author: Jonah Blue, MD 01/08/2023 4:54 PM  For on call review [email or web address].

## 2023-01-08 NOTE — Progress Notes (Signed)
Patient arrived to [facility]ICU via carelink.  Patient is unresponsive at this time.  Neurology MD paged.  Patient's mom notified of admission.
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
## 2023-01-08 NOTE — Progress Notes (Signed)
Plan of Care Note for accepted transfer   Patient: Alisha Reynolds MRN: [id number]   DOA: 01/08/2023  Facility requesting transfer: [facility] Requesting Provider: Dr. Derry Lory Reason for transfer: Continuous EEG  Facility course: Patient who presented for [facility], hysteroscopy, and Myosure today for DUB.   Patient was noted to have myoclonic activity during procedure and needs continuous EEG.   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  Seen for this in the past - routine EEG negative, non-epileptic spell.  Had another episode post-operatively today.  Given persistent spells despite provocative maneuvers, needs LTM to characterize events.    Plan of care: The patient is accepted for observation to Telemetry unit, at [facility].   Author: Jonah Blue, MD 01/08/2023  Check [email or web address] for on-call coverage.  Nursing staff, Please call [facility] Admits & Consults System-Wide number on Amion as soon as patient's arrival, so appropriate admitting provider can evaluate the pt.

## 2023-01-08 NOTE — Anesthesia Preprocedure Evaluation (Addendum)
Anesthesia Evaluation  Patient identified by MRN, date of birth, ID band Patient awake    Reviewed: Allergy & Precautions, NPO status , Patient's Chart, lab work & pertinent test results  History of Anesthesia Complications Negative for: history of anesthetic complications  Airway Mallampati: II  TM Distance: >3 FB Neck ROM: Full    Dental  (+) Teeth Intact, Dental Advisory Given   Pulmonary neg shortness of breath, asthma , neg sleep apnea, neg recent URI   breath sounds clear to auscultation       Cardiovascular negative cardio ROS  Rhythm:Regular     Neuro/Psych  Headaches PSYCHIATRIC DISORDERS Anxiety     Seizure like activity after anesthesia in 2023 prompting admission, no diagnosis of seizures, not on seizure meds    GI/Hepatic negative GI ROS, Neg liver ROS,,,  Endo/Other  negative endocrine ROS    Renal/GU negative Renal ROS     Musculoskeletal negative musculoskeletal ROS (+)    Abdominal   Peds  Hematology  (+) Blood dyscrasia, anemia Lab Results      Component                Value               Date                      WBC                      5.7                 09/24/2022                HGB                      11.9 (L)            09/24/2022                HCT                      36.8                09/24/2022                MCV                      85.4                09/24/2022                PLT                      304                 09/24/2022              Anesthesia Other Findings   Reproductive/Obstetrics Lab Results      Component                Value               Date                      PREGTESTUR               NEGATIVE            01/08/2023  PREGSERUM                NEGATIVE            09/20/2022                HCG                      <5.0                02/08/2022                                        Anesthesia Physical Anesthesia  Plan  ASA: 2  Anesthesia Plan: General   Post-op Pain Management: Toradol IV (intra-op)* and Ofirmev IV (intra-op)*   Induction: Intravenous  PONV Risk Score and Plan: 3 and Ondansetron, Dexamethasone, Propofol infusion, TIVA and Midazolam  Airway Management Planned: LMA  Additional Equipment: None  Intra-op Plan:   Post-operative Plan: Extubation in OR  Informed Consent: I have reviewed the patients History and Physical, chart, labs and discussed the procedure including the risks, benefits and alternatives for the proposed anesthesia with the patient or authorized representative who has indicated his/her understanding and acceptance.     Dental advisory given  Plan Discussed with: CRNA  Anesthesia Plan Comments:        Anesthesia Quick Evaluation

## 2023-01-08 NOTE — Progress Notes (Signed)
GYN  Pt with myoclonic activity, unresponsive after hysteroscopy/D&C/myosure, transferred to [facility] 4N ICU.  Came to hospital, visited with mother.  Pt has had this reaction after surgery before - just woke hours after surgery.    Will continue to follow.   [phone]

## 2023-01-08 NOTE — Progress Notes (Signed)
LTM EEG hooked up and running - no initial skin breakdown - push button tested - [facility] monitoring.  (Spoke with Tia)

## 2023-01-08 NOTE — Brief Op Note (Signed)
01/08/2023  10:11 AM  PATIENT:  Dyke Brackett  27 y.o. female  PRE-OPERATIVE DIAGNOSIS:  abnormal uterine bleeding  POST-OPERATIVE DIAGNOSIS:  abnormal uterine bleeding  PROCEDURE:  Procedure(s): DILATATION AND CURETTAGE /HYSTEROSCOPY and MYOSURE (N/A)  SURGEON:  Surgeon(s) and Role:    * Bovard-Stuckert, Denym Christenberry, MD - Primary  ANESTHESIA:   local and general by LMA  EBL:  5 mL IVF per anesthesia, 100cc clear urine  BLOOD ADMINISTERED:none  DRAINS: none   LOCAL MEDICATIONS USED:  LIDOCAINE   SPECIMEN:  Source of Specimen:  endometrial currettings/polyp  DISPOSITION OF SPECIMEN:  PATHOLOGY  COUNTS:  YES  TOURNIQUET:  * No tourniquets in log *  DICTATION: .Other Dictation: Dictation Number [id number]  PLAN OF CARE:  Await input from anesthesia and neuro  PATIENT DISPOSITION:   Pt with myoclonic activity, awaiting neuro involvement   Delay start of Pharmacological VTE agent (>24hrs) due to surgical blood loss or risk of bleeding: not applicable

## 2023-01-08 NOTE — Interval H&P Note (Signed)
History and Physical Interval Note:  01/08/2023 8:20 AM  Alisha Reynolds  has presented today for surgery, with the diagnosis of abnormal uterine bleeding.  The various methods of treatment have been discussed with the patient and family. After consideration of risks, benefits and other options for treatment, the patient has consented to  Procedure(s): DILATATION AND CURETTAGE /HYSTEROSCOPY (N/A) as a surgical intervention.  The patient's history has been reviewed, patient examined, no change in status, stable for surgery.  I have reviewed the patient's chart and labs.  Questions were answered to the patient's satisfaction.     [name]

## 2023-01-09 DIAGNOSIS — F445 Conversion disorder with seizures or convulsions: Secondary | ICD-10-CM | POA: Diagnosis not present

## 2023-01-09 DIAGNOSIS — N939 Abnormal uterine and vaginal bleeding, unspecified: Secondary | ICD-10-CM

## 2023-01-09 DIAGNOSIS — G253 Myoclonus: Secondary | ICD-10-CM | POA: Diagnosis not present

## 2023-01-09 DIAGNOSIS — Z01818 Encounter for other preprocedural examination: Secondary | ICD-10-CM | POA: Diagnosis not present

## 2023-01-09 DIAGNOSIS — R569 Unspecified convulsions: Secondary | ICD-10-CM

## 2023-01-09 LAB — CBC
HCT: 34.5 % — ABNORMAL LOW (ref 36.0–46.0)
Hemoglobin: 11.5 g/dL — ABNORMAL LOW (ref 12.0–15.0)
MCH: 28 pg (ref 26.0–34.0)
MCHC: 33.3 g/dL (ref 30.0–36.0)
MCV: 83.9 fL (ref 80.0–100.0)
Platelets: 276 10*3/uL (ref 150–400)
RBC: 4.11 MIL/uL (ref 3.87–5.11)
RDW: 14.8 % (ref 11.5–15.5)
WBC: 7 10*3/uL (ref 4.0–10.5)
nRBC: 0 % (ref 0.0–0.2)

## 2023-01-09 LAB — BASIC METABOLIC PANEL
Anion gap: 11 (ref 5–15)
BUN: 10 mg/dL (ref 6–20)
CO2: 21 mmol/L — ABNORMAL LOW (ref 22–32)
Calcium: 9.1 mg/dL (ref 8.9–10.3)
Chloride: 104 mmol/L (ref 98–111)
Creatinine, Ser: 0.75 mg/dL (ref 0.44–1.00)
GFR, Estimated: >60 mL/min (ref >60)
Glucose, Bld: 90 mg/dL (ref 70–99)
Potassium: 3.6 mmol/L (ref 3.5–5.1)
Sodium: 136 mmol/L (ref 135–145)

## 2023-01-09 MED ORDER — NAPROXEN 250 MG PO TABS
500.0000 mg | ORAL_TABLET | Freq: Two times a day (BID) | ORAL | Status: DC | PRN
  Administered 2023-01-09: 500 mg via ORAL
  Filled 2023-01-09 (×2): qty 2

## 2023-01-09 MED ORDER — ORAL CARE MOUTH RINSE: 15.0000 mL | OROMUCOSAL | Status: DC

## 2023-01-09 MED ORDER — ORAL CARE MOUTH RINSE: 15.0000 mL | OROMUCOSAL | Status: DC | PRN

## 2023-01-09 MED ORDER — NAPROXEN 500 MG PO TABS: ORAL_TABLET | ORAL | 1 refills | Status: DC

## 2023-01-09 MED ORDER — IBUPROFEN 200 MG PO TABS
600.0000 mg | ORAL_TABLET | Freq: Four times a day (QID) | ORAL | Status: DC | PRN
  Administered 2023-01-09: 600 mg via ORAL
  Filled 2023-01-09: qty 3

## 2023-01-09 NOTE — Progress Notes (Signed)
LTM maint complete - no skin breakdown [facility] monitored, Event button test confirmed by [facility]. ? ?
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
## 2023-01-09 NOTE — Progress Notes (Addendum)
1 Day Post-Op Procedure(s) (LRB): DILATATION AND CURETTAGE /HYSTEROSCOPY and MYOSURE (N/A)  Subjective: Patient reports some cramping pain.  Otherwise Awake, alert and appropriate.  Doing well.  Sound like plan is d/c in AM.  .    Objective: I have reviewed patient's vital signs, intake and output, medications, and labs.  General: alert, cooperative, and no distress GI: normal findings: Soft, NT, ND Vaginal Bleeding: minimal  Assessment: s/p Procedure(s): DILATATION AND CURETTAGE /HYSTEROSCOPY and MYOSURE (N/A): stable, slow to wake after procedure Plan: F/U 2 weeks  in office Naproxen for pain.  (Rx was given with d/c paperwork after hysteroscopy)   [phone]  LOS: 1 day    Sherian Rein, MD 01/09/2023, 6:53 PM

## 2023-01-09 NOTE — Progress Notes (Signed)
EEG maint complete.  ?

## 2023-01-09 NOTE — Hospital Course (Addendum)
27 y.o. female with medical history significant of dysmenorrhea presenting with myoclonic jerking following D&C, hysteroscopy, and Myosure today.   History obtained from her mother at the bedside.  She has been sedated since arrival.  During previous surgery for ectopic she had similar post-op course with non-epileptic convulsions documented on EEG in 04/2021.  Per report, her mother reported significant life stressors, relationship difficulties and thinks her current presentation may also be non-epileptic in nature.   ER Course:  [facility] to [facility] transfer:   Patient was noted to have myoclonic activity during procedure and needs continuous EEG. 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 Seen for this in the past - routine EEG negative, non-epileptic spell. Had another episode post-operatively .Given persistent spells despite provocative maneuvers, needed LTM to characterize events.

## 2023-01-09 NOTE — Progress Notes (Signed)
  Progress Note   Patient: Alisha Reynolds WGN:[id number] DOB: Jun 17, 1996 DOA: 01/08/2023     1 DOS: the patient was seen and examined on 01/09/2023   Brief hospital course: 27 y.o. female with medical history significant of dysmenorrhea presenting with myoclonic jerking following D&C, hysteroscopy, and Myosure today.   History obtained from her mother at the bedside.  She has been sedated since arrival.  During previous surgery for ectopic she had similar post-op course with non-epileptic convulsions documented on EEG in 04/2021.  Per report, her mother reported significant life stressors, relationship difficulties and thinks her current presentation may also be non-epileptic in nature.   ER Course:  [facility] to [facility] transfer:   Patient was noted to have myoclonic activity during procedure and needs continuous EEG. 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 Seen for this in the past - routine EEG negative, non-epileptic spell. Had another episode post-operatively .Given persistent spells despite provocative maneuvers, needed LTM to characterize events.  Assessment and Plan: Myoclonia -Patient with myoclonic jerks post-operatively -She has h/o similar presentation after previous surgery, was non-epileptic in nature -Neurology consulted -Pt had been undergoing long-term EEG with no seizures or epileptiform activity seen -Pt continued to be rather drowsy but noted to be able to follow commands -Per Neurology, her findings are not felt to be epileptic and does not require seizure precautions at this time -recommend psych referral   Abnormal uterine bleeding -Patient with AUB -Underwent D&C/hysteroscopy and Myosure 6/14   Psychosocial stressors -She may benefit from psychiatric/psychologic support, based on history provided by her mother -Continue amitriptyline    Subjective: When seen, very drowsy, difficult to assess  Physical Exam: Vitals:   01/09/23 1200 01/09/23 1300 01/09/23 1400  01/09/23 1500  BP: 106/67 117/75    Pulse: 77 80 80 66  Resp: 13 19 18 20   Temp:    97.8 F (36.6 C)  TempSrc:    Oral  SpO2: 97% 97% 97% 96%  Weight:      Height:       General exam: Lethargic, laying in bed, in nad Respiratory system: Normal respiratory effort, no wheezing Cardiovascular system: regular rate, s1, s2 Gastrointestinal system: Soft, nondistended, positive BS Central nervous system: CN2-12 grossly intact, strength intact Extremities: Perfused, no clubbing Skin: Normal skin turgor, no notable skin lesions seen Psychiatry: unable to assess given mentation  Data Reviewed:  Labs reviewed: na 136, K 3.6, Cr 0.75, WBC 7.0, Hgb 11.5  Family Communication: Pt in room, family not at bedside  Disposition: Status is: Inpatient Remains inpatient appropriate because: severity of illness  Planned Discharge Destination: Home     Author: Rickey Barbara, MD 01/09/2023 5:40 PM  For on call review [email or web address].

## 2023-01-09 NOTE — Progress Notes (Signed)
LTM EEG discontinued - no skin breakdown at unhook.   
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
## 2023-01-10 DIAGNOSIS — N939 Abnormal uterine and vaginal bleeding, unspecified: Secondary | ICD-10-CM | POA: Diagnosis not present

## 2023-01-10 DIAGNOSIS — Z01818 Encounter for other preprocedural examination: Secondary | ICD-10-CM | POA: Diagnosis not present

## 2023-01-10 DIAGNOSIS — G253 Myoclonus: Secondary | ICD-10-CM | POA: Diagnosis not present

## 2023-01-10 LAB — COMPREHENSIVE METABOLIC PANEL
ALT: 15 U/L (ref 0–44)
AST: 18 U/L (ref 15–41)
Albumin: 3.6 g/dL (ref 3.5–5.0)
Alkaline Phosphatase: 82 U/L (ref 38–126)
Anion gap: 11 (ref 5–15)
BUN: 10 mg/dL (ref 6–20)
CO2: 22 mmol/L (ref 22–32)
Calcium: 8.5 mg/dL — ABNORMAL LOW (ref 8.9–10.3)
Chloride: 104 mmol/L (ref 98–111)
Creatinine, Ser: 0.82 mg/dL (ref 0.44–1.00)
GFR, Estimated: >60 mL/min (ref >60)
Glucose, Bld: 85 mg/dL (ref 70–99)
Potassium: 3.4 mmol/L — ABNORMAL LOW (ref 3.5–5.1)
Sodium: 137 mmol/L (ref 135–145)
Total Bilirubin: 0.6 mg/dL (ref 0.3–1.2)
Total Protein: 6.3 g/dL — ABNORMAL LOW (ref 6.5–8.1)

## 2023-01-10 LAB — CBC
HCT: 33.9 % — ABNORMAL LOW (ref 36.0–46.0)
Hemoglobin: 10.8 g/dL — ABNORMAL LOW (ref 12.0–15.0)
MCH: 27.8 pg (ref 26.0–34.0)
MCHC: 31.9 g/dL (ref 30.0–36.0)
MCV: 87.1 fL (ref 80.0–100.0)
Platelets: 257 10*3/uL (ref 150–400)
RBC: 3.89 MIL/uL (ref 3.87–5.11)
RDW: 15.1 % (ref 11.5–15.5)
WBC: 7.2 10*3/uL (ref 4.0–10.5)
nRBC: 0 % (ref 0.0–0.2)

## 2023-01-10 MED ORDER — TAMSULOSIN HCL 0.4 MG PO CAPS
0.4000 mg | ORAL_CAPSULE | Freq: Every day | ORAL | Status: DC
  Administered 2023-01-10: 0.4 mg via ORAL
  Filled 2023-01-10: qty 1

## 2023-01-10 MED ORDER — ONDANSETRON HCL 4 MG PO TABS: 4.0000 mg | ORAL_TABLET | Freq: Four times a day (QID) | ORAL | 0 refills | Status: AC | PRN

## 2023-01-10 MED ORDER — ONDANSETRON 4 MG PO TBDP: 4.0000 mg | ORAL_TABLET | Freq: Three times a day (TID) | ORAL | 3 refills | Status: DC | PRN
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
## 2023-01-10 NOTE — Progress Notes (Signed)
D/C education given to Pt and all questions answered. No printed prescriptions to give or equipment to deliver. IVs removed. Pt taken to car with all belongings.   ?

## 2023-01-10 NOTE — Progress Notes (Signed)
PT Cancellation Note  Patient Details Name: Alisha Reynolds MRN: [id number] DOB: May 17, 1996   Cancelled Treatment:    Reason Eval/Treat Not Completed: PT screened, no needs identified, will sign off.  Spoke with RN who reports pt is mobilizing well and about to d/c, advised PT screen this pt out as no PT needs identified at this time.  MD also advised RN PT only if needed.  Thanks,  Corinna Capra, PT, DPT  Acute Rehabilitation Secure chat is best for contact #[phone] office      Dimas Aguas 01/10/2023, 11:11 AM

## 2023-01-11 ENCOUNTER — Encounter (HOSPITAL_BASED_OUTPATIENT_CLINIC_OR_DEPARTMENT_OTHER): Payer: Self-pay | Admitting: Obstetrics and Gynecology

## 2023-01-11 LAB — SURGICAL PATHOLOGY

## 2023-01-11 NOTE — Anesthesia Postprocedure Evaluation (Addendum)
Anesthesia Post Note  Patient: Alisha Reynolds  Procedure(s) Performed: DILATATION AND CURETTAGE /HYSTEROSCOPY and MYOSURE (Uterus)     Patient location during evaluation: Other Anesthesia Type: General Level of consciousness: sedated Pain management: pain level controlled Vital Signs Assessment: post-procedure vital signs reviewed and stable Respiratory status: spontaneous breathing, nonlabored ventilation and respiratory function stable Cardiovascular status: blood pressure returned to baseline and stable Postop Assessment: no apparent nausea or vomiting Anesthetic complications: no Comments: Given seizure like activity patient transferred to [facility] via [facility] for eeg per neurology   No notable events documented.  Last Vitals:  Vitals:   01/10/23 0900 01/10/23 1000  BP: 114/67 126/76  Pulse: 70 81  Resp: 17 (!) 26  Temp:    SpO2: 96% 98%    Last Pain:  Vitals:   01/10/23 0800  TempSrc: Oral  PainSc: 3    Pain Goal: Patients Stated Pain Goal: 3 (01/08/23 0711)                 Bianco Cange

## 2023-01-11 NOTE — Addendum Note (Signed)
Addendum  created 01/11/23 0830 by Val Eagle, MD   Clinical Note Signed, SmartForm saved

## 2023-01-18 DIAGNOSIS — L700 Acne vulgaris: Secondary | ICD-10-CM | POA: Diagnosis not present

## 2023-01-18 DIAGNOSIS — L853 Xerosis cutis: Secondary | ICD-10-CM | POA: Diagnosis not present

## 2023-01-18 DIAGNOSIS — K130 Diseases of lips: Secondary | ICD-10-CM | POA: Diagnosis not present

## 2023-02-01 DIAGNOSIS — Z3202 Encounter for pregnancy test, result negative: Secondary | ICD-10-CM | POA: Diagnosis not present

## 2023-02-01 DIAGNOSIS — N840 Polyp of corpus uteri: Secondary | ICD-10-CM | POA: Diagnosis not present

## 2023-02-04 DIAGNOSIS — E785 Hyperlipidemia, unspecified: Secondary | ICD-10-CM | POA: Diagnosis not present

## 2023-02-04 DIAGNOSIS — Z79899 Other long term (current) drug therapy: Secondary | ICD-10-CM | POA: Diagnosis not present

## 2023-02-04 DIAGNOSIS — Z91018 Allergy to other foods: Secondary | ICD-10-CM | POA: Diagnosis not present

## 2023-03-01 DIAGNOSIS — R112 Nausea with vomiting, unspecified: Secondary | ICD-10-CM | POA: Diagnosis not present

## 2023-03-02 DIAGNOSIS — K130 Diseases of lips: Secondary | ICD-10-CM | POA: Diagnosis not present

## 2023-03-02 DIAGNOSIS — L853 Xerosis cutis: Secondary | ICD-10-CM | POA: Diagnosis not present

## 2023-03-02 DIAGNOSIS — L700 Acne vulgaris: Secondary | ICD-10-CM | POA: Diagnosis not present

## 2023-03-26 DIAGNOSIS — M25552 Pain in left hip: Secondary | ICD-10-CM | POA: Diagnosis not present

## 2023-03-26 DIAGNOSIS — Z9889 Other specified postprocedural states: Secondary | ICD-10-CM | POA: Diagnosis not present

## 2023-03-26 DIAGNOSIS — M21852 Other specified acquired deformities of left thigh: Secondary | ICD-10-CM | POA: Diagnosis not present

## 2023-03-26 DIAGNOSIS — M76891 Other specified enthesopathies of right lower limb, excluding foot: Secondary | ICD-10-CM | POA: Diagnosis not present

## 2023-04-08 DIAGNOSIS — L700 Acne vulgaris: Secondary | ICD-10-CM | POA: Diagnosis not present

## 2023-04-08 DIAGNOSIS — K130 Diseases of lips: Secondary | ICD-10-CM | POA: Diagnosis not present

## 2023-04-08 DIAGNOSIS — Z202 Contact with and (suspected) exposure to infections with a predominantly sexual mode of transmission: Secondary | ICD-10-CM | POA: Diagnosis not present

## 2023-04-08 DIAGNOSIS — L853 Xerosis cutis: Secondary | ICD-10-CM | POA: Diagnosis not present

## 2023-04-08 DIAGNOSIS — N809 Endometriosis, unspecified: Secondary | ICD-10-CM | POA: Diagnosis not present

## 2023-04-08 DIAGNOSIS — Z113 Encounter for screening for infections with a predominantly sexual mode of transmission: Secondary | ICD-10-CM | POA: Diagnosis not present

## 2023-04-08 DIAGNOSIS — Z1389 Encounter for screening for other disorder: Secondary | ICD-10-CM | POA: Diagnosis not present

## 2023-04-08 DIAGNOSIS — R110 Nausea: Secondary | ICD-10-CM | POA: Diagnosis not present

## 2023-04-08 DIAGNOSIS — Z01419 Encounter for gynecological examination (general) (routine) without abnormal findings: Secondary | ICD-10-CM | POA: Diagnosis not present

## 2023-04-21 DIAGNOSIS — Z23 Encounter for immunization: Secondary | ICD-10-CM | POA: Diagnosis not present

## 2023-04-21 DIAGNOSIS — E663 Overweight: Secondary | ICD-10-CM | POA: Diagnosis not present

## 2023-05-19 ENCOUNTER — Encounter: Payer: Self-pay | Admitting: Cardiology

## 2023-05-19 ENCOUNTER — Ambulatory Visit: Payer: 59 | Attending: Cardiology | Admitting: Cardiology

## 2023-05-19 VITALS — BP 130/96 | Ht 61.0 in | Wt 148.6 lb

## 2023-05-19 DIAGNOSIS — I4711 Inappropriate sinus tachycardia, so stated: Secondary | ICD-10-CM | POA: Diagnosis not present

## 2023-05-19 DIAGNOSIS — R0683 Snoring: Secondary | ICD-10-CM

## 2023-05-19 DIAGNOSIS — R400 Somnolence: Secondary | ICD-10-CM | POA: Diagnosis not present

## 2023-05-19 DIAGNOSIS — R5383 Other fatigue: Secondary | ICD-10-CM | POA: Diagnosis not present

## 2023-05-19 DIAGNOSIS — E785 Hyperlipidemia, unspecified: Secondary | ICD-10-CM | POA: Diagnosis not present

## 2023-05-19 MED ORDER — PROPRANOLOL HCL 10 MG PO TABS: 10.0000 mg | ORAL_TABLET | Freq: Every evening | ORAL | 3 refills | Status: DC
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
## 2023-05-19 NOTE — Progress Notes (Unsigned)
Patient agreement reviewed and signed on 05/19/2023.  WatchPAT issued to patient on 05/19/2023 by Brunetta Genera, CMA. Patient aware to not open the WatchPAT box until contacted with the activation PIN. Patient profile initialized in CloudPAT on 05/19/2023 by Ashley Mariner. Device serial number: [id number]

## 2023-05-19 NOTE — Patient Instructions (Addendum)
Medication Instructions:  Your physician has recommended you make the following change in your medication:  START: Propranolol 10 mg at night *If you need a refill on your cardiac medications before your next appointment, please call your pharmacy*   Lab Work: Lipids - please come fasting If you have labs (blood work) drawn today and your tests are completely normal, you will receive your results only by: MyChart Message (if you have MyChart) OR A paper copy in the mail If you have any lab test that is abnormal or we need to change your treatment, we will call you to review the results.   Testing/Procedures: Itamar sleep study   Follow-Up: At [facility], you and your health needs are our priority.  As part of our continuing mission to provide you with exceptional heart care, we have created designated Provider Care Teams.  These Care Teams include your primary Cardiologist (physician) and Advanced Practice Providers (APPs -  Physician Assistants and Nurse Practitioners) who all work together to provide you with the care you need, when you need it.   Your next appointment:   12 week(s)  Provider:   Thomasene Ripple, DO

## 2023-05-20 DIAGNOSIS — E785 Hyperlipidemia, unspecified: Secondary | ICD-10-CM | POA: Diagnosis not present

## 2023-05-20 LAB — LIPID PANEL
Chol/HDL Ratio: 3.3 ratio (ref 0.0–4.4)
Cholesterol, Total: 203 mg/dL — ABNORMAL HIGH (ref 100–199)
HDL: 62 mg/dL (ref >39)
LDL Chol Calc (NIH): 133 mg/dL — ABNORMAL HIGH (ref 0–99)
Triglycerides: 46 mg/dL (ref 0–149)
VLDL Cholesterol Cal: 8 mg/dL (ref 5–40)

## 2023-05-21 ENCOUNTER — Telehealth: Payer: Self-pay | Admitting: Cardiology

## 2023-05-21 DIAGNOSIS — M25552 Pain in left hip: Secondary | ICD-10-CM | POA: Diagnosis not present

## 2023-05-21 DIAGNOSIS — M21852 Other specified acquired deformities of left thigh: Secondary | ICD-10-CM | POA: Diagnosis not present

## 2023-05-21 MED ORDER — PROPRANOLOL HCL 10 MG PO TABS: 10.0000 mg | ORAL_TABLET | Freq: Every evening | ORAL | 3 refills | Status: DC

## 2023-05-21 NOTE — Telephone Encounter (Signed)
Pt's medication was sent to pt's pharmacy as requested. Confirmation received.  °

## 2023-05-21 NOTE — Telephone Encounter (Signed)
*  STAT* If patient is at the pharmacy, call can be transferred to refill team.   1. Which medications need to be refilled? (please list name of each medication and dose if known) propranolol (INDERAL) 10 MG tablet   2. Which pharmacy/location (including street and city if local pharmacy) is medication to be sent to? [id number] - [place], [place] - [address]   3. Do they need a 30 day or 90 day supply? 90   Pharmacy states they didn't get prescription.

## 2023-05-21 NOTE — Telephone Encounter (Signed)
Patient hasn't heard any thing about when she suppose to start the sleep. And also asking that her appt notes to be sent to her PCP. Please advise

## 2023-05-21 NOTE — Telephone Encounter (Signed)
Office notes sent to PCP

## 2023-05-25 NOTE — Telephone Encounter (Signed)
Ordering provider: DR Servando Salina Associated diagnoses: R40.0--R06.83 WatchPAT PA obtained on 05/25/2023 by Latrelle Dodrill, CMA. Authorization: No; tracking ID NO PA REQ FOR HST Patient notified of PIN ([id number]) on 05/25/2023 via Notification Method: phone.

## 2023-05-29 ENCOUNTER — Encounter (INDEPENDENT_AMBULATORY_CARE_PROVIDER_SITE_OTHER): Payer: Self-pay | Admitting: Cardiology

## 2023-05-29 DIAGNOSIS — G4710 Hypersomnia, unspecified: Secondary | ICD-10-CM

## 2023-05-29 DIAGNOSIS — G4719 Other hypersomnia: Secondary | ICD-10-CM

## 2023-05-30 ENCOUNTER — Ambulatory Visit: Payer: 59 | Attending: Cardiology

## 2023-05-30 DIAGNOSIS — H9202 Otalgia, left ear: Secondary | ICD-10-CM | POA: Diagnosis not present

## 2023-05-30 DIAGNOSIS — J029 Acute pharyngitis, unspecified: Secondary | ICD-10-CM | POA: Diagnosis not present

## 2023-05-30 DIAGNOSIS — R051 Acute cough: Secondary | ICD-10-CM | POA: Diagnosis not present

## 2023-05-30 DIAGNOSIS — Z20822 Contact with and (suspected) exposure to covid-19: Secondary | ICD-10-CM | POA: Diagnosis not present

## 2023-05-30 DIAGNOSIS — R0683 Snoring: Secondary | ICD-10-CM

## 2023-05-30 DIAGNOSIS — R5383 Other fatigue: Secondary | ICD-10-CM

## 2023-05-30 DIAGNOSIS — R400 Somnolence: Secondary | ICD-10-CM

## 2023-05-30 NOTE — Procedures (Signed)
   SLEEP STUDY REPORT Patient Information Study Date: 05/29/2023 Patient Name: Alisha Reynolds Patient ID: [id number] Birth Date: 10-30-1995 Age: 27 Gender: Female BMI: 27.9 (W=148 lb, H=5' 1'') Referring Physician: Thomasene Ripple, DO  TEST DESCRIPTION: Home sleep apnea testing was completed using the WatchPat, a Type 1 device, utilizing peripheral arterial tonometry (PAT), chest movement, actigraphy, pulse oximetry, pulse rate, body position and snore. AHI was calculated with apnea and hypopnea using valid sleep time as the denominator. RDI includes apneas, hypopneas, and RERAs. The data acquired and the scoring of sleep and all associated events were performed in accordance with the recommended standards and specifications as outlined in the AASM Manual for the Scoring of Sleep and Associated Events 2.2.0 (2015).  FINDINGS: 1. No evidence of Obstructive Sleep Apnea with AHI 0.7/hr. 2. No Central Sleep Apnea. 3. Oxygen desaturations as low as 93%. 4. Moderate snoring was present. O2 sats were < 88% for 0 minutes. 5. Total sleep time was 7 hrs and 24 min. 6. 29.6% of total sleep time was spent in REM sleep. 7. Shortened sleep onset latency at 5 min. 8. Shortened REM sleep onset latency at 56 min. 9. Total awakenings were 8.  DIAGNOSIS: Normal study with no significant sleep disordered breathing.  RECOMMENDATIONS: 1. Normal study with no significant sleep disordered breathing.  2. Healthy sleep recommendations include: adequate nightly sleep (normal 7-9 hrs/night), avoidance of caffeine after noon and alcohol near bedtime, and maintaining a sleep environment that is cool, dark and quiet.  3. Weight loss for overweight patients is recommended.  4. Snoring recommendations include: weight loss where appropriate, side sleeping, and avoidance of alcohol before bed.  5. Operation of motor vehicle or dangerous equipment must be avoided when feeling drowsy, excessively sleepy, or mentally  fatigued.  6. An ENT consultation which may be useful for specific causes of and possible treatment of bothersome snoring .  7. Weight loss may be of benefit in reducing the severity of snoring.   Signature: Armanda Magic, MD; [facility]; Diplomat, American Board of Sleep Medicine Electronically Signed: 05/30/2023 5:26:55 PM

## 2023-05-31 ENCOUNTER — Telehealth: Payer: Self-pay | Admitting: *Deleted

## 2023-05-31 NOTE — Telephone Encounter (Signed)
The patient has been notified of the result and verbalized understanding.  All questions (if any) were answered. Latrelle Dodrill, CMA 05/31/2023 2:10 PM    Pt is aware and agreeable to normal results.

## 2023-05-31 NOTE — Telephone Encounter (Signed)
-----   Message from Armanda Magic sent at 05/30/2023  5:28 PM EST ----- Please let patient know that sleep study showed no significant sleep apnea.

## 2023-06-01 DIAGNOSIS — J069 Acute upper respiratory infection, unspecified: Secondary | ICD-10-CM | POA: Diagnosis not present

## 2023-06-03 DIAGNOSIS — J189 Pneumonia, unspecified organism: Secondary | ICD-10-CM | POA: Diagnosis not present

## 2023-06-03 DIAGNOSIS — L700 Acne vulgaris: Secondary | ICD-10-CM | POA: Diagnosis not present

## 2023-06-03 DIAGNOSIS — H6123 Impacted cerumen, bilateral: Secondary | ICD-10-CM | POA: Diagnosis not present

## 2023-06-10 DIAGNOSIS — J189 Pneumonia, unspecified organism: Secondary | ICD-10-CM | POA: Diagnosis not present

## 2023-06-15 ENCOUNTER — Ambulatory Visit: Payer: 59 | Admitting: Neurology

## 2023-06-15 ENCOUNTER — Encounter: Payer: Self-pay | Admitting: Neurology

## 2023-06-15 VITALS — BP 128/87 | HR 85 | Ht 61.0 in | Wt 156.0 lb

## 2023-06-15 DIAGNOSIS — G43E01 Chronic migraine with aura, not intractable, with status migrainosus: Secondary | ICD-10-CM | POA: Diagnosis not present

## 2023-06-15 MED ORDER — AMITRIPTYLINE HCL 50 MG PO TABS: 50.0000 mg | ORAL_TABLET | Freq: Every day | ORAL | 3 refills | Status: DC

## 2023-06-15 NOTE — Patient Instructions (Signed)
Continue current medications including Amitriptyline and Nurtec  Continue to follow up with your doctors.  Return in a year or sooner if worse
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
## 2023-06-18 ENCOUNTER — Encounter (HOSPITAL_BASED_OUTPATIENT_CLINIC_OR_DEPARTMENT_OTHER): Payer: Self-pay | Admitting: Orthopedic Surgery

## 2023-06-23 ENCOUNTER — Other Ambulatory Visit (HOSPITAL_COMMUNITY): Payer: Self-pay | Admitting: Orthopedic Surgery

## 2023-06-23 DIAGNOSIS — M25552 Pain in left hip: Secondary | ICD-10-CM

## 2023-06-23 DIAGNOSIS — M21852 Other specified acquired deformities of left thigh: Secondary | ICD-10-CM

## 2023-06-30 DIAGNOSIS — Z111 Encounter for screening for respiratory tuberculosis: Secondary | ICD-10-CM | POA: Diagnosis not present

## 2023-06-30 DIAGNOSIS — Z1159 Encounter for screening for other viral diseases: Secondary | ICD-10-CM | POA: Diagnosis not present

## 2023-06-30 DIAGNOSIS — G43909 Migraine, unspecified, not intractable, without status migrainosus: Secondary | ICD-10-CM | POA: Diagnosis not present

## 2023-06-30 DIAGNOSIS — Z79899 Other long term (current) drug therapy: Secondary | ICD-10-CM | POA: Diagnosis not present

## 2023-06-30 DIAGNOSIS — Z23 Encounter for immunization: Secondary | ICD-10-CM | POA: Diagnosis not present

## 2023-06-30 DIAGNOSIS — E7800 Pure hypercholesterolemia, unspecified: Secondary | ICD-10-CM | POA: Diagnosis not present

## 2023-06-30 DIAGNOSIS — Z0000 Encounter for general adult medical examination without abnormal findings: Secondary | ICD-10-CM | POA: Diagnosis not present

## 2023-07-06 ENCOUNTER — Ambulatory Visit (HOSPITAL_COMMUNITY)
Admission: RE | Admit: 2023-07-06 | Discharge: 2023-07-06 | Disposition: A | Discharge status: Home / Self Care | Payer: 59 | Source: Ambulatory Visit | Attending: Orthopedic Surgery | Admitting: Orthopedic Surgery

## 2023-07-06 DIAGNOSIS — M21852 Other specified acquired deformities of left thigh: Secondary | ICD-10-CM

## 2023-07-06 DIAGNOSIS — M25552 Pain in left hip: Secondary | ICD-10-CM | POA: Diagnosis not present

## 2023-07-06 DIAGNOSIS — M25452 Effusion, left hip: Secondary | ICD-10-CM | POA: Diagnosis not present

## 2023-07-06 MED ORDER — GADOBUTROL 1 MMOL/ML IV SOLN
2.0000 mL | Freq: Once | INTRAVENOUS | Status: AC | PRN
  Administered 2023-07-06: 0.4 mL

## 2023-07-06 MED ORDER — LIDOCAINE HCL (PF) 1 % IJ SOLN
5.0000 mL | Freq: Once | INTRAMUSCULAR | Status: AC
  Administered 2023-07-06: 5 mL

## 2023-07-06 MED ORDER — SODIUM CHLORIDE (PF) 0.9% IJ SOLUTION - NO CHARGE
10.0000 mL | INTRAMUSCULAR | Status: AC
  Administered 2023-07-06: 3 mL

## 2023-07-06 MED ORDER — IOHEXOL 180 MG/ML  SOLN
10.0000 mL | Freq: Once | INTRAMUSCULAR | Status: AC | PRN
  Administered 2023-07-06: 7 mL via INTRA_ARTICULAR

## 2023-07-06 NOTE — Procedures (Signed)
Interventional Radiology Procedure Note  Risks and benefits of joint injection were discussed with the patient including, but not limited to bleeding, infection, injection of contrast outside the joint, and damage to adjacent structures.  All of the patient's questions were answered, patient is agreeable to proceed. Consent signed and in chart.  A timeout was performed with all members of the team prior to start of the procedure. Correct patient and correct procedure was confirmed. Allergies were reviewed.   PROCEDURE SUMMARY:  Successful fluoro guided left hip arthrogram. No immediate complications.  Pt tolerated well.   EBL = none  Please see full dictation in imaging section of Epic for procedure details.

## 2023-07-14 DIAGNOSIS — Z79899 Other long term (current) drug therapy: Secondary | ICD-10-CM | POA: Diagnosis not present

## 2023-07-14 DIAGNOSIS — E7800 Pure hypercholesterolemia, unspecified: Secondary | ICD-10-CM | POA: Diagnosis not present

## 2023-07-14 DIAGNOSIS — Z23 Encounter for immunization: Secondary | ICD-10-CM | POA: Diagnosis not present

## 2023-07-14 DIAGNOSIS — Z1159 Encounter for screening for other viral diseases: Secondary | ICD-10-CM | POA: Diagnosis not present

## 2023-07-15 DIAGNOSIS — M21852 Other specified acquired deformities of left thigh: Secondary | ICD-10-CM | POA: Diagnosis not present

## 2023-07-15 DIAGNOSIS — M24152 Other articular cartilage disorders, left hip: Secondary | ICD-10-CM | POA: Diagnosis not present

## 2023-07-15 DIAGNOSIS — M76892 Other specified enthesopathies of left lower limb, excluding foot: Secondary | ICD-10-CM | POA: Diagnosis not present

## 2023-07-16 DIAGNOSIS — Z23 Encounter for immunization: Secondary | ICD-10-CM | POA: Diagnosis not present

## 2023-07-19 DIAGNOSIS — N809 Endometriosis, unspecified: Secondary | ICD-10-CM | POA: Diagnosis not present

## 2023-07-19 DIAGNOSIS — N8000 Endometriosis of the uterus, unspecified: Secondary | ICD-10-CM | POA: Diagnosis not present

## 2023-07-22 ENCOUNTER — Other Ambulatory Visit: Payer: Self-pay | Admitting: Medical Genetics

## 2023-07-29 ENCOUNTER — Ambulatory Visit (HOSPITAL_COMMUNITY)
Admission: EM | Admit: 2023-07-29 | Discharge: 2023-07-29 | Disposition: A | Discharge status: Home / Self Care | Payer: 59 | Attending: Family Medicine | Admitting: Family Medicine

## 2023-07-29 ENCOUNTER — Encounter (HOSPITAL_COMMUNITY): Payer: Self-pay

## 2023-07-29 DIAGNOSIS — J069 Acute upper respiratory infection, unspecified: Secondary | ICD-10-CM | POA: Diagnosis not present

## 2023-07-29 LAB — POCT RAPID STREP A (OFFICE): Rapid Strep A Screen: NEGATIVE

## 2023-07-29 MED ORDER — FLUTICASONE PROPIONATE 50 MCG/ACT NA SUSP: 1.0000 | Freq: Every day | NASAL | 0 refills | Status: AC

## 2023-07-29 NOTE — ED Triage Notes (Signed)
 Patient here today with c/o ST, cough, chest congestion, night sweats, and fever. Patient states that she had pneumonia in November. She has been taking Tylenol with some relief. She works in the ED at [employer].
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
## 2023-07-29 NOTE — Discharge Instructions (Signed)
 You have an upper respiratory infection. Most cases are due to a virus and do not require antibiotics for treatment.  Make sure to continue good oral hydration.  You can take tylenol for fever as needed Start Flonase daily for the next week and then as needed. You can use nasal saline spray multiple times daily as well.  You can use a daily antihistamine or guaifenesin as an expectorant but you need to be well hydrated for these medications to work.  Get adequate rest for recovery Maintain distance from others and wear a mask in public areas to avoid spread  If you start to experience shortness of breath, fevers that don't respond to medication, confusion, profound neck stiffness, or fainting, return to the urgent care or ED.

## 2023-07-30 LAB — SARS CORONAVIRUS 2 (TAT 6-24 HRS): SARS Coronavirus 2: NEGATIVE

## 2023-08-04 ENCOUNTER — Ambulatory Visit (HOSPITAL_COMMUNITY)
Admission: EM | Admit: 2023-08-04 | Discharge: 2023-08-04 | Disposition: A | Discharge status: Home / Self Care | Payer: 59 | Attending: Sports Medicine | Admitting: Sports Medicine

## 2023-08-04 ENCOUNTER — Encounter (HOSPITAL_COMMUNITY): Payer: Self-pay

## 2023-08-04 DIAGNOSIS — R21 Rash and other nonspecific skin eruption: Secondary | ICD-10-CM

## 2023-08-04 DIAGNOSIS — J019 Acute sinusitis, unspecified: Secondary | ICD-10-CM

## 2023-08-04 DIAGNOSIS — B9689 Other specified bacterial agents as the cause of diseases classified elsewhere: Secondary | ICD-10-CM | POA: Diagnosis not present

## 2023-08-04 MED ORDER — DOXYCYCLINE HYCLATE 100 MG PO CAPS: 100.0000 mg | ORAL_CAPSULE | Freq: Two times a day (BID) | ORAL | 0 refills | Status: AC

## 2023-08-04 NOTE — ED Triage Notes (Signed)
 Pt c/o cough, sore throat, and runny nose since 12/27. States seen here last week with no improvement. C/o rash to face since Saturday.
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
## 2023-08-04 NOTE — Discharge Instructions (Signed)
 I have sent you a 5 day course of Doxycycline  to take twice daily, AM and PM to treat superimposed bacterial sinus infection.  For the rash on your chin continue using the topical aquaphor or vaseline.  For the cough, try a humidifier or spending time in warm steamy shower which may help however there is not any definitively effective treatment to suppress your cough and the cough is helpful to make sure you are clearing your airway.

## 2023-08-10 ENCOUNTER — Ambulatory Visit: Payer: 59 | Admitting: Cardiology

## 2023-08-10 DIAGNOSIS — N760 Acute vaginitis: Secondary | ICD-10-CM | POA: Diagnosis not present

## 2023-08-10 DIAGNOSIS — N898 Other specified noninflammatory disorders of vagina: Secondary | ICD-10-CM | POA: Diagnosis not present

## 2023-08-10 DIAGNOSIS — Z3202 Encounter for pregnancy test, result negative: Secondary | ICD-10-CM | POA: Diagnosis not present

## 2023-08-10 DIAGNOSIS — N939 Abnormal uterine and vaginal bleeding, unspecified: Secondary | ICD-10-CM | POA: Diagnosis not present

## 2023-08-10 DIAGNOSIS — B009 Herpesviral infection, unspecified: Secondary | ICD-10-CM | POA: Diagnosis not present

## 2023-08-13 DIAGNOSIS — R21 Rash and other nonspecific skin eruption: Secondary | ICD-10-CM | POA: Diagnosis not present

## 2023-08-27 ENCOUNTER — Other Ambulatory Visit (HOSPITAL_COMMUNITY): Payer: Self-pay

## 2023-08-30 ENCOUNTER — Other Ambulatory Visit (HOSPITAL_COMMUNITY): Payer: 59

## 2023-09-01 ENCOUNTER — Other Ambulatory Visit: Payer: Self-pay | Admitting: Family Medicine

## 2023-09-01 DIAGNOSIS — B3731 Acute candidiasis of vulva and vagina: Secondary | ICD-10-CM | POA: Diagnosis not present

## 2023-09-14 ENCOUNTER — Other Ambulatory Visit (HOSPITAL_COMMUNITY): Payer: 59

## 2023-09-14 ENCOUNTER — Other Ambulatory Visit (HOSPITAL_COMMUNITY)
Admission: RE | Admit: 2023-09-14 | Discharge: 2023-09-14 | Disposition: A | Discharge status: Home / Self Care | Payer: Self-pay | Source: Ambulatory Visit | Attending: Medical Genetics | Admitting: Medical Genetics

## 2023-09-16 ENCOUNTER — Other Ambulatory Visit (HOSPITAL_COMMUNITY): Payer: Self-pay

## 2023-09-21 ENCOUNTER — Other Ambulatory Visit (HOSPITAL_COMMUNITY): Payer: 59

## 2023-09-26 LAB — GENECONNECT MOLECULAR SCREEN: Genetic Analysis Overall Interpretation: NEGATIVE

## 2023-10-18 ENCOUNTER — Telehealth: Payer: Self-pay | Admitting: Cardiology

## 2023-10-18 NOTE — Telephone Encounter (Signed)
 Patient brought in some paper work. Will leave in provider box.

## 2023-10-19 ENCOUNTER — Ambulatory Visit: Payer: 59 | Admitting: Cardiology

## 2023-10-22 NOTE — Telephone Encounter (Signed)
 Patient will stop by to pick up the paperwork but wants a call back to confirm Dr. Servando Salina has completed the "Cardiology" portion of the paperwork.

## 2023-10-22 NOTE — Telephone Encounter (Signed)
 Lvm w/pt regarding DoT paperwork that was dropped off earlier this week. Per Dr. Servando Salina, paperwork needs to be completed by PCP. Paperwork left at front desk at [facility] office for pt to pick up at convenience.  JB, 10-22-23
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
## 2023-11-04 ENCOUNTER — Ambulatory Visit: Payer: Self-pay | Attending: General Practice | Admitting: General Practice

## 2023-11-04 ENCOUNTER — Encounter: Payer: Self-pay | Admitting: General Practice

## 2023-11-04 VITALS — BP 124/76 | HR 74 | Ht 61.0 in | Wt 154.8 lb

## 2023-11-04 DIAGNOSIS — R55 Syncope and collapse: Secondary | ICD-10-CM

## 2023-11-04 DIAGNOSIS — I4711 Inappropriate sinus tachycardia, so stated: Secondary | ICD-10-CM | POA: Diagnosis not present

## 2023-11-04 MED ORDER — PROPRANOLOL HCL 10 MG PO TABS: 10.0000 mg | ORAL_TABLET | Freq: Every evening | ORAL | 3 refills | Status: AC

## 2023-11-04 NOTE — Patient Instructions (Signed)
 Medication Instructions:  TAKE YOUR PROPRANOLOL NIGHTLY AND FOR SUSTAINED PALPITATIONS >20 MINUTES MUST BE 6-8 HOURS BETWEEN DOSES *If you need a refill on your cardiac medications before your next appointment, please call your pharmacy*  Lab Work: NONE  Testing/Procedures: NONE  Follow-Up: At [facility], you and your health needs are our priority.  As part of our continuing mission to provide you with exceptional heart care, our providers are all part of one team.  This team includes your primary Cardiologist (physician) and Advanced Practice Providers or APPs (Physician Assistants and Nurse Practitioners) who all work together to provide you with the care you need, when you need it.  Your next appointment:   6-9 month(s)  Provider:   Thomasene Ripple, DO    Other Instructions MAINTAIN HYDRATION CONTINUE LOW IMPACT AEROBICS      1st Floor: - Lobby - Registration  - Pharmacy  - Lab - Cafe  2nd Floor: - PV Lab - Diagnostic Testing (echo, CT, nuclear med)  3rd Floor: - Vacant  4th Floor: - TCTS (cardiothoracic surgery) - AFib Clinic - Structural Heart Clinic - Vascular Surgery  - Vascular Ultrasound  5th Floor: - HeartCare Cardiology (general and EP) - Clinical Pharmacy for coumadin, hypertension, lipid, weight-loss medications, and med management appointments    Valet parking services will be available as well.

## 2023-11-07 NOTE — Progress Notes (Signed)
 Subjective:   Chief Complaint  Patient presents with  . Sore Throat  . Fever    Has had a sore throat since Friday. Also having pain in the right shoulder blade area when coughing or taking a deep breath. No OTC medications today.      History of Present Illness The patient is a 28 year old female who presents for evaluation of cough and sore throat.  She began experiencing a sore throat on Friday, initially attributing it to allergies or pollen exposure. However, she subsequently developed intermittent fevers, with a recorded temperature of 100.5 last night, which was managed with 1000 mg of Tylenol . She also reports a persistent, nagging pain in the posterior right lower thoracic region,  particularly noticeable during coughing, palpation, or inhalation. Her appetite has been diminished, and she has been feeling lethargic, however she has been maintaining oral hydration. She is currently enrolled in a nursing program and is uncertain if she may have been exposed to an illness at the hospital or from her classmates. She reports mild nasal congestion and experienced chest congestion upon awakening this morning, which resolved after her morning routine. She has been coughing and reports painful swallowing but no difficulty in mouth opening. She does not report any recent air travel, hospitalizations, surgeries, calf pain or swelling, or history of blood clots.   She has a history of asthma and uses an inhaler as needed, although she has not required it recently. She does not report any shortness of breath, wheezing, or chest pain.  PERC Criteria: 1 -Age < 50 -Pulse < 100 -SpO2 > 94% -No unilateral leg swelling -No hemoptysis -No recent surgery or trauma -hormone use -No prior DVT/PE  Parts of patient history reviewed include PMH, problem list, medications, allergies, and social history.  Objective:   Vitals:   11/07/23 1053  BP: 132/70  BP Location: Left arm  Patient Position:  Sitting  Pulse: 92  Resp: 16  Temp: 98.2 F (36.8 C)  TempSrc: Tympanic  SpO2: 100%  Weight: 67.1 kg (148 lb)  Height: 1.549 m (5' 1)    Physical Exam Vitals and nursing note reviewed.  Constitutional:      General: She is not in acute distress.    Appearance: Normal appearance. She is normal weight. She is not ill-appearing or toxic-appearing.  HENT:     Head: Normocephalic and atraumatic.     Right Ear: Tympanic membrane and ear canal normal.     Left Ear: Tympanic membrane and ear canal normal.     Nose: Congestion and rhinorrhea present.     Mouth/Throat:     Pharynx: Oropharynx is clear. Uvula midline. Posterior oropharyngeal erythema present. No pharyngeal swelling or oropharyngeal exudate.     Tonsils: No tonsillar exudate.  Eyes:     Extraocular Movements: Extraocular movements intact.     Conjunctiva/sclera: Conjunctivae normal.     Pupils: Pupils are equal, round, and reactive to light.  Cardiovascular:     Rate and Rhythm: Normal rate and regular rhythm.     Pulses: Normal pulses.     Heart sounds: Normal heart sounds.  Pulmonary:     Effort: Pulmonary effort is normal. No tachypnea, accessory muscle usage or respiratory distress.     Breath sounds: Decreased air movement present. Wheezing present. No rhonchi.       Comments: Tenderness to right mid-back over the intercostal cartilage of rib 8-9. No midline vertebral tenderness. No crepitus, swelling, bruising, or increased warmth.  Abdominal:  Palpations: Abdomen is soft.  Musculoskeletal:     Cervical back: Normal range of motion and neck supple.     Right lower leg: No edema.     Left lower leg: No edema.     Comments: No posterior calf pain, swelling, erythema, or increased warmth  Lymphadenopathy:     Cervical: No cervical adenopathy.  Skin:    General: Skin is warm.     Capillary Refill: Capillary refill takes less than 2 seconds.  Neurological:     General: No focal deficit present.     Mental  Status: She is alert and oriented to person, place, and time. Mental status is at baseline.     XR Chest 2 Views  Preliminary Result by Lorrene Dallas Molt, MD (04/13 1117)  XR CHEST 2 VIEWS, 11/07/2023 11:08 AM    INDICATION: cough, shoulderblade pain, Acute cough \ R05.1 Acute cough   cough, shoulderblade pain  COMPARISON: None.    FINDINGS:     Cardiovascular: Cardiac silhouette and pulmonary vasculature are within   normal limits.  Mediastinum: Within normal limits.  Lungs/pleura: No focal airspace opacities, pleural effusion, or   pneumothorax.  Upper abdomen: Visualized portions are unremarkable.   Chest wall/osseous structures: Unremarkable.      IMPRESSION:  There is no evidence of acute cardiac or pulmonary abnormality.       Results for orders placed or performed in visit on 11/07/23  POC Rapid Strep A   Collection Time: 11/07/23 11:09 AM  Result Value Ref Range   Strep A Antigen Negative Negative   Internal Control Acceptable    Kit/Device Lot # [id number]    Kit/Device Expiration Date 01/23/2025   POC SARS-COV-2 SYMPTOMATIC (IDNOW)   Collection Time: 11/07/23 11:42 AM  Result Value Ref Range   SARS-COV-2 IDNOW Positive (A) Negative   SARS IDNOW Information      A rapid, molecular diagnostic test on the IDNOW.  Negative results should be treated as presumptive and, if inconsistent with clinical symptoms should be confirmed with an alternative molecular assay.    I have personally review radiographs and I agree with the radiologist  Lab work and/or xrays were reviewed and incorporated into the decision making process    Assessment/Plan:   Assessment & Plan Covid Costochondritis   This patient presents with symptoms consistent with viral illness,  positive COVID test in clinic.  Negative strep. Well-appearing on physical exam, no vital sign abnormalities.  Patient is nontoxic appearing and not in need of emergent medical intervention. Do not suspect underlying  cardiopulmonary process. Patient has history of asthma, exacerbation unlikely as patient has not had to use rescue inhaler, denies shortness of breath/wheezing, and unremarkable cardiopulmonary exam without evidence of patient's lung sounds, increased work of breathing, or hypoxia.  Chest x-ray negative, doubt pneumothorax and pneumonia.  Considered pulmonary embolism, PERC score 1 due to hormone use.  Feel this is unlikely due to absence of hypoxia, tachycardia, or other significant risk factors.  Additionally history and physical more consistent with musculoskeletal etiology such as costochondritis versus slipped rib syndrome due to reproducible pain on palpation to the area of discomfort.  Plan is for an IM injection of Toradol  for rib cage inflammation.  Continue OTC anti-inflammatories, ibuprofen , outpatient.  Discussed OTC cough and cold preparations for symptom management.  School note provided to the patient.  Advised she should return to school when she is fever free without antipyretics for 24 hours.  Reviewed signs and symptoms of worsening  illness requiring emergent reevaluation, patient verbalized understanding.   Home Care   Patient has been instructed on medications, dosages, side effects, and possible interactions as associated with each diagnosis in my impression and plan above. Patient education (verbal/handout) given on diagnosis, pathophysiology, treatment of diagnosis, side effects of medication use for treatment, restrictions while taking medication, and supportive measures.   Patient was instructed on when to follow up and know that they can follow up here, with their PCP, Urgent Care, ED.  They have been instructed that if symptoms worsen that should return to the clinic, go to the nearest ED, or activate EMS. Red Flags associated with their diagnoses were reviewed and patient was educated on what to do if red.       Patient agreed with plan and voiced understanding.  No barriers to  adherence perceived by myself.  Portions of this note may have been dictated using Dragon dictation software/hardware and may contain grammatical or spelling errors.   Electronically signed by:   Sotero Pore, DNP FNP-C [facility]  11/07/2023 11:48 AM

## 2023-11-23 ENCOUNTER — Telehealth: Payer: Self-pay | Admitting: Neurology

## 2023-11-23 NOTE — Telephone Encounter (Signed)
 Pt came in a dropped of DMV paperwork for [place] to fill out. I put in POD 2, please call pt when ready for PU

## 2023-11-24 DIAGNOSIS — Z0289 Encounter for other administrative examinations: Secondary | ICD-10-CM

## 2023-11-25 ENCOUNTER — Telehealth: Payer: Self-pay | Admitting: *Deleted

## 2023-11-25 NOTE — Telephone Encounter (Signed)
Pt dmv form ready for p/u @ the front desk.

## 2023-12-14 DIAGNOSIS — L700 Acne vulgaris: Secondary | ICD-10-CM | POA: Diagnosis not present

## 2023-12-17 ENCOUNTER — Emergency Department (HOSPITAL_BASED_OUTPATIENT_CLINIC_OR_DEPARTMENT_OTHER)
Admission: EM | Admit: 2023-12-17 | Discharge: 2023-12-17 | Disposition: A | Discharge status: Home / Self Care | Attending: Emergency Medicine | Admitting: Emergency Medicine

## 2023-12-17 ENCOUNTER — Other Ambulatory Visit: Payer: Self-pay

## 2023-12-17 ENCOUNTER — Encounter (HOSPITAL_BASED_OUTPATIENT_CLINIC_OR_DEPARTMENT_OTHER): Payer: Self-pay | Admitting: *Deleted

## 2023-12-17 DIAGNOSIS — L089 Local infection of the skin and subcutaneous tissue, unspecified: Secondary | ICD-10-CM | POA: Diagnosis not present

## 2023-12-17 DIAGNOSIS — S30860A Insect bite (nonvenomous) of lower back and pelvis, initial encounter: Secondary | ICD-10-CM | POA: Insufficient documentation

## 2023-12-17 DIAGNOSIS — M546 Pain in thoracic spine: Secondary | ICD-10-CM | POA: Diagnosis not present

## 2023-12-17 DIAGNOSIS — W57XXXA Bitten or stung by nonvenomous insect and other nonvenomous arthropods, initial encounter: Secondary | ICD-10-CM | POA: Diagnosis not present

## 2023-12-17 DIAGNOSIS — J45909 Unspecified asthma, uncomplicated: Secondary | ICD-10-CM | POA: Diagnosis not present

## 2023-12-17 DIAGNOSIS — Z91010 Allergy to peanuts: Secondary | ICD-10-CM | POA: Diagnosis not present

## 2023-12-17 DIAGNOSIS — R519 Headache, unspecified: Secondary | ICD-10-CM | POA: Insufficient documentation

## 2023-12-17 MED ORDER — KETOROLAC TROMETHAMINE 15 MG/ML IJ SOLN
15.0000 mg | Freq: Once | INTRAMUSCULAR | Status: AC
  Administered 2023-12-17: 15 mg via INTRAVENOUS
  Filled 2023-12-17: qty 1

## 2023-12-17 MED ORDER — LIDOCAINE 5 % EX PTCH
1.0000 | MEDICATED_PATCH | CUTANEOUS | Status: DC
Start: 2023-12-17 — End: 2023-12-18
  Administered 2023-12-17: 1 via TRANSDERMAL
  Filled 2023-12-17: qty 1

## 2023-12-17 MED ORDER — SODIUM CHLORIDE 0.9 % IV BOLUS
500.0000 mL | Freq: Once | INTRAVENOUS | Status: AC
  Administered 2023-12-17: 500 mL via INTRAVENOUS

## 2023-12-17 NOTE — ED Triage Notes (Signed)
 Pt c/o bug bite/ abscess to right buttock, noticed yesterday. Reports area warm to touch. Today started having generalized body aches and r shoulder blade pain

## 2023-12-17 NOTE — ED Provider Notes (Signed)
 [place] EMERGENCY DEPARTMENT AT [facility] Provider Note   CSN: [id number] Arrival date & time: 12/17/23  2208     History  Chief Complaint  Patient presents with   Insect Bite    Alisha Reynolds is a 28 y.o. female with a history of asthma and anemia presents the ED today for insect bite.  Patient reports that 2 days ago she woke up from a nap and had a red insect bite on her right lateral buttock.  States that this morning she woke up with a headache and right-sided upper back pain.  States that it feels that she has bodyaches that she has the flu.  Denies any fevers.  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  No nausea, vomiting, diarrhea. No rash or itching. No history of allergies.  No shortness of breath, chest tightness, or throat closing sensation.    Home Medications Prior to Admission medications   Medication Sig Start Date End Date Taking? Authorizing Provider  albuterol  (VENTOLIN  HFA) 108 (90 Base) MCG/ACT inhaler Inhale 2 puffs into the lungs every 6 (six) hours as needed for wheezing or shortness of breath. 09/24/22   Pokhrel, Laxman, MD  fluticasone  (FLONASE ) 50 MCG/ACT nasal spray Place 1 spray into both nostrils daily. 07/29/23   Claybon Cuna, MD  ondansetron  (ZOFRAN ) 4 MG tablet Take 1 tablet (4 mg total) by mouth every 6 (six) hours as needed for nausea or vomiting. 01/10/23   Oral Billings, MD  propranolol  (INDERAL ) 10 MG tablet Take 1 tablet (10 mg total) by mouth at bedtime. AND PRN fOR SUSTAINED PALP > must be 6-8 hours between doses 11/04/23   Carie Charity, NP  rizatriptan  (MAXALT -MLT) 10 MG disintegrating tablet Take 1 tablet (10 mg total) by mouth as needed for migraine. May repeat in 2 hours if needed 12/10/22   Camara, Amadou, MD  tretinoin (RETIN-A) 0.05 % cream Apply topically at bedtime. 06/03/23   [provider]  valACYclovir  (VALTREX ) 1000 MG tablet Take 1,000 mg by mouth  daily. 09/14/22   [provider]      Allergies    Other, Peanut-containing drug products, Oxycodone , Tramadol , and Pork-derived products    Review of Systems   Review of Systems  Skin:        Bug bite  All other systems reviewed and are negative.   Physical Exam Updated Vital Signs BP 139/79   Pulse 82   Temp 97.9 F (36.6 C) (Oral)   Resp 16   LMP 10/30/2023   SpO2 100%  Physical Exam Vitals and nursing note reviewed.  Constitutional:      General: She is not in acute distress.    Appearance: Normal appearance.  HENT:     Head: Normocephalic and atraumatic.     Mouth/Throat:     Mouth: Mucous membranes are moist.  Eyes:     Conjunctiva/sclera: Conjunctivae normal.     Pupils: Pupils are equal, round, and reactive to light.  Cardiovascular:     Rate and Rhythm: Normal rate and regular rhythm.     Pulses: Normal pulses.     Heart sounds: Normal heart sounds.  Pulmonary:     Effort: Pulmonary effort is normal.     Breath sounds: Normal breath sounds.  Abdominal:     Palpations: Abdomen is soft.     Tenderness: There is no abdominal tenderness.  Musculoskeletal:  General: Tenderness present. No swelling or deformity. Normal range of motion.     Cervical back: Normal range of motion.     Comments: Tenderness to right rhomboid muscle. ROM intact of upper and lower extremities.  Skin:    General: Skin is warm and dry.     Findings: No rash.     Comments: Flat area of slight erythema at the lateral lower right buttock without warmth to touch or area of fluctuance. No drainage. No rash or urticaria.  Neurological:     General: No focal deficit present.     Mental Status: She is alert.  Psychiatric:        Mood and Affect: Mood normal.        Behavior: Behavior normal.    ED Results / Procedures / Treatments   Labs (all labs ordered are listed, but only abnormal results are displayed) Labs Reviewed - No data to  display  EKG None  Radiology No results found.  Procedures Procedures    Medications Ordered in ED Medications  lidocaine  (LIDODERM ) 5 % 1 patch (has no administration in time range)  ketorolac  (TORADOL ) 15 MG/ML injection 15 mg (15 mg Intravenous Given 12/17/23 2308)  sodium chloride  0.9 % bolus 500 mL (0 mLs Intravenous Stopped 12/17/23 2327)    ED Course/ Medical Decision Making/ A&P                                 Medical Decision Making Risk Prescription drug management.   This patient presents to the ED for concern of bug bite, this involves an extensive number of treatment options, and is a complaint that carries with it a high risk of complications and morbidity.   Differential diagnosis includes: allergic reaction, cellulitis, folliculitis, abscess, viral illness, etc.   Comorbidities  See HPI above   Additional History  Additional history obtained from prior records   Lab Tests  I ordered and personally interpreted labs.  The pertinent results include:   Declined pregnancy test - states she knows she's not pregnant due to the medication she's taking Declined respiratory panel   Problem List / ED Course / Critical Interventions / Medication Management  Patient woke up with a red possible bug bite on her right lateral buttock yesterday.  Woke up this morning with right upper back pain and headache.  Is concerned it could be related.  States that it feels like she has bodyaches that she has the flu. Right rhomboid feels like there could be a knot there.  Patient possibly slept on it wrong.  Could be muscle strain or spasm. Boyfriend at bedside states that the bite seems to have improved and looks better than before.  Slightly pink in color but there is no area of fluctuance or pustule.  No tenderness to touch.  No drainage. No drainable abscess. I ordered medications including: Toradol  and NS for headache  Lidocaine  patch for back pain Reevaluation of  the patient after these medicines showed that the patient improved I have reviewed the patients home medicines and have made adjustments as needed   Social Determinants of Health  Access to healthcare   Test / Admission - Considered  Patient is stable and safe for discharge home. Return precautions given.       Final Clinical Impression(s) / ED Diagnoses Final diagnoses:  Nonintractable headache, unspecified chronicity pattern, unspecified headache type  Bug bite, initial encounter  Rx / DC Orders ED Discharge Orders     None         Sonnie Dusky, PA-C 12/17/23 2335    Almond Army, MD 12/20/23 [phone]

## 2023-12-17 NOTE — Discharge Instructions (Addendum)
 Alternate between ibuprofen  600 mg and Tylenol  500 mg 4 hours as needed for headache and shoulder pain. You can get lidocaine  patches over the counter to put on your shoulder for additional pain relief.  Follow-up with your primary care provider in the next week for reevaluation.

## 2023-12-29 ENCOUNTER — Ambulatory Visit

## 2023-12-29 DIAGNOSIS — B9789 Other viral agents as the cause of diseases classified elsewhere: Secondary | ICD-10-CM | POA: Diagnosis not present

## 2023-12-29 DIAGNOSIS — N760 Acute vaginitis: Secondary | ICD-10-CM | POA: Diagnosis not present

## 2024-01-01 DIAGNOSIS — R110 Nausea: Secondary | ICD-10-CM | POA: Diagnosis not present

## 2024-01-01 DIAGNOSIS — Z5321 Procedure and treatment not carried out due to patient leaving prior to being seen by health care provider: Secondary | ICD-10-CM | POA: Diagnosis not present

## 2024-01-01 DIAGNOSIS — K5900 Constipation, unspecified: Secondary | ICD-10-CM | POA: Diagnosis not present

## 2024-01-01 DIAGNOSIS — R1011 Right upper quadrant pain: Secondary | ICD-10-CM | POA: Diagnosis not present

## 2024-01-20 ENCOUNTER — Other Ambulatory Visit (HOSPITAL_COMMUNITY): Payer: Self-pay

## 2024-01-21 ENCOUNTER — Emergency Department (HOSPITAL_COMMUNITY)

## 2024-01-21 ENCOUNTER — Emergency Department (HOSPITAL_COMMUNITY)
Admission: EM | Admit: 2024-01-21 | Discharge: 2024-01-22 | Disposition: A | Discharge status: Home / Self Care | Attending: Student in an Organized Health Care Education/Training Program | Admitting: Student in an Organized Health Care Education/Training Program

## 2024-01-21 ENCOUNTER — Encounter (HOSPITAL_COMMUNITY): Payer: Self-pay

## 2024-01-21 DIAGNOSIS — R55 Syncope and collapse: Secondary | ICD-10-CM | POA: Diagnosis not present

## 2024-01-21 DIAGNOSIS — R1033 Periumbilical pain: Secondary | ICD-10-CM | POA: Diagnosis not present

## 2024-01-21 DIAGNOSIS — R231 Pallor: Secondary | ICD-10-CM | POA: Diagnosis not present

## 2024-01-21 DIAGNOSIS — Z91010 Allergy to peanuts: Secondary | ICD-10-CM | POA: Insufficient documentation

## 2024-01-21 DIAGNOSIS — R000 Tachycardia, unspecified: Secondary | ICD-10-CM | POA: Diagnosis not present

## 2024-01-21 DIAGNOSIS — R9431 Abnormal electrocardiogram [ECG] [EKG]: Secondary | ICD-10-CM | POA: Diagnosis not present

## 2024-01-21 DIAGNOSIS — R0902 Hypoxemia: Secondary | ICD-10-CM | POA: Diagnosis not present

## 2024-01-21 DIAGNOSIS — J45909 Unspecified asthma, uncomplicated: Secondary | ICD-10-CM | POA: Insufficient documentation

## 2024-01-21 DIAGNOSIS — R1084 Generalized abdominal pain: Secondary | ICD-10-CM | POA: Diagnosis not present

## 2024-01-21 DIAGNOSIS — R109 Unspecified abdominal pain: Secondary | ICD-10-CM | POA: Diagnosis not present

## 2024-01-21 LAB — COMPREHENSIVE METABOLIC PANEL WITH GFR
ALT: 26 U/L (ref 0–44)
AST: 24 U/L (ref 15–41)
Albumin: 4.2 g/dL (ref 3.5–5.0)
Alkaline Phosphatase: 73 U/L (ref 38–126)
Anion gap: 12 (ref 5–15)
BUN: 10 mg/dL (ref 6–20)
CO2: 20 mmol/L — ABNORMAL LOW (ref 22–32)
Calcium: 9.7 mg/dL (ref 8.9–10.3)
Chloride: 105 mmol/L (ref 98–111)
Creatinine, Ser: 0.91 mg/dL (ref 0.44–1.00)
GFR, Estimated: >60 mL/min (ref >60)
Glucose, Bld: 95 mg/dL (ref 70–99)
Potassium: 3.6 mmol/L (ref 3.5–5.1)
Sodium: 137 mmol/L (ref 135–145)
Total Bilirubin: 0.7 mg/dL (ref 0.0–1.2)
Total Protein: 7.2 g/dL (ref 6.5–8.1)

## 2024-01-21 LAB — CBG MONITORING, ED: Glucose-Capillary: 92 mg/dL (ref 70–99)

## 2024-01-21 LAB — CBC
HCT: 40.1 % (ref 36.0–46.0)
Hemoglobin: 13.1 g/dL (ref 12.0–15.0)
MCH: 27.8 pg (ref 26.0–34.0)
MCHC: 32.7 g/dL (ref 30.0–36.0)
MCV: 85 fL (ref 80.0–100.0)
Platelets: 235 10*3/uL (ref 150–400)
RBC: 4.72 MIL/uL (ref 3.87–5.11)
RDW: 12.1 % (ref 11.5–15.5)
WBC: 8.3 10*3/uL (ref 4.0–10.5)
nRBC: 0 % (ref 0.0–0.2)

## 2024-01-21 LAB — HCG, SERUM, QUALITATIVE: Preg, Serum: NEGATIVE

## 2024-01-21 LAB — LIPASE, BLOOD: Lipase: 35 U/L (ref 11–51)

## 2024-01-21 MED ORDER — KETOROLAC TROMETHAMINE 15 MG/ML IJ SOLN
15.0000 mg | Freq: Once | INTRAMUSCULAR | Status: AC
  Administered 2024-01-21: 15 mg via INTRAVENOUS
  Filled 2024-01-21: qty 1

## 2024-01-21 MED ORDER — LACTATED RINGERS IV BOLUS
1000.0000 mL | Freq: Once | INTRAVENOUS | Status: AC
  Administered 2024-01-21: 1000 mL via INTRAVENOUS

## 2024-01-21 MED ORDER — IOHEXOL 350 MG/ML SOLN
75.0000 mL | Freq: Once | INTRAVENOUS | Status: AC | PRN
  Administered 2024-01-21: 75 mL via INTRAVENOUS

## 2024-01-21 NOTE — ED Notes (Signed)
 Patient transported to CT

## 2024-01-21 NOTE — ED Notes (Addendum)
 This RN asked EDP Amy  to assess pt.
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
## 2024-01-21 NOTE — ED Triage Notes (Signed)
 Pt to ED via [facility] from home c/o syncopal episode while in shower. Pt also c/o RLQ abdominal pain.   GCS 13  No medication given by EMS.    Last VS: HR 92, 98%RA, 28RR, 108/60

## 2024-01-22 MED ORDER — GABAPENTIN 100 MG PO CAPS: 100.0000 mg | ORAL_CAPSULE | Freq: Three times a day (TID) | ORAL | 1 refills | Status: AC | PRN

## 2024-01-22 MED ORDER — MORPHINE SULFATE (PF) 4 MG/ML IV SOLN
4.0000 mg | Freq: Once | INTRAVENOUS | Status: AC
  Administered 2024-01-22: 4 mg via INTRAVENOUS
  Filled 2024-01-22: qty 1

## 2024-01-22 NOTE — Discharge Instructions (Addendum)
 You were evaluated in the Emergency Department and after careful evaluation, we did not find any emergent condition requiring admission or further testing in the hospital.  Your exam/testing today is overall reassuring.  Recommend continued follow-up with GI as well as your surgeon.  Can try taking the gabapentin  3 times daily for pain.  Please return to the Emergency Department if you experience any worsening of your condition.   Thank you for allowing us  to be a part of your care.

## 2024-01-22 NOTE — ED Provider Notes (Signed)
  Provider Note MRN:  [id number]  Arrival date & time: 01/22/24    ED Course and Medical Decision Making  Assumed care of patient at sign-out or upon transfer.  In the shower, abdominal pain for the past 2 days.  History of endometriosis.  Awaiting CT.  2 AM update: CT and ultrasound are without acute process.  Patient feeling better, no indication for further testing or admission.  Pain burning in nature near the healed incision site from her surgery, can follow-up with her surgeon.  Appropriate for discharge.  Procedures  Final Clinical Impressions(s) / ED Diagnoses     ICD-10-CM   1. Periumbilical abdominal pain  R10.33       ED Discharge Orders          Ordered    gabapentin  (NEURONTIN ) 100 MG capsule  3 times daily PRN        01/22/24 0223              Discharge Instructions      You were evaluated in the Emergency Department and after careful evaluation, we did not find any emergent condition requiring admission or further testing in the hospital.  Your exam/testing today is overall reassuring.  Recommend continued follow-up with GI as well as your surgeon.  Can try taking the gabapentin  3 times daily for pain.  Please return to the Emergency Department if you experience any worsening of your condition.   Thank you for allowing us  to be a part of your care.      Ozell HERO. Theadore, MD [facility] Emergency Medicine [facility] [email or web address]    Theadore Ozell HERO, MD 01/22/24 [phone]

## 2024-01-24 ENCOUNTER — Other Ambulatory Visit (HOSPITAL_COMMUNITY): Payer: Self-pay

## 2024-01-24 MED ORDER — ORILISSA 150 MG PO TABS: 150.0000 mg | ORAL_TABLET | Freq: Every day | ORAL | 4 refills | Status: AC

## 2024-01-25 DIAGNOSIS — Z79899 Other long term (current) drug therapy: Secondary | ICD-10-CM | POA: Diagnosis not present

## 2024-01-25 DIAGNOSIS — R41840 Attention and concentration deficit: Secondary | ICD-10-CM | POA: Diagnosis not present

## 2024-01-27 ENCOUNTER — Ambulatory Visit (HOSPITAL_COMMUNITY)
Admission: EM | Admit: 2024-01-27 | Discharge: 2024-01-27 | Disposition: A | Discharge status: Home / Self Care | Source: Ambulatory Visit | Attending: Emergency Medicine | Admitting: Emergency Medicine

## 2024-01-27 ENCOUNTER — Other Ambulatory Visit: Payer: Self-pay

## 2024-01-27 ENCOUNTER — Other Ambulatory Visit (HOSPITAL_BASED_OUTPATIENT_CLINIC_OR_DEPARTMENT_OTHER): Payer: Self-pay

## 2024-01-27 ENCOUNTER — Emergency Department (HOSPITAL_COMMUNITY)

## 2024-01-27 ENCOUNTER — Other Ambulatory Visit (HOSPITAL_COMMUNITY): Payer: Self-pay

## 2024-01-27 ENCOUNTER — Emergency Department (HOSPITAL_COMMUNITY)
Admission: EM | Admit: 2024-01-27 | Discharge: 2024-01-27 | Disposition: A | Discharge status: Home / Self Care | Attending: Emergency Medicine | Admitting: Emergency Medicine

## 2024-01-27 DIAGNOSIS — F10920 Alcohol use, unspecified with intoxication, uncomplicated: Secondary | ICD-10-CM

## 2024-01-27 DIAGNOSIS — R4182 Altered mental status, unspecified: Secondary | ICD-10-CM | POA: Diagnosis not present

## 2024-01-27 DIAGNOSIS — F10120 Alcohol abuse with intoxication, uncomplicated: Secondary | ICD-10-CM | POA: Insufficient documentation

## 2024-01-27 DIAGNOSIS — Z0441 Encounter for examination and observation following alleged adult rape: Secondary | ICD-10-CM | POA: Diagnosis present

## 2024-01-27 DIAGNOSIS — R55 Syncope and collapse: Secondary | ICD-10-CM | POA: Diagnosis not present

## 2024-01-27 DIAGNOSIS — Z91010 Allergy to peanuts: Secondary | ICD-10-CM | POA: Insufficient documentation

## 2024-01-27 DIAGNOSIS — J45909 Unspecified asthma, uncomplicated: Secondary | ICD-10-CM | POA: Insufficient documentation

## 2024-01-27 DIAGNOSIS — R000 Tachycardia, unspecified: Secondary | ICD-10-CM | POA: Diagnosis not present

## 2024-01-27 DIAGNOSIS — T7421XA Adult sexual abuse, confirmed, initial encounter: Secondary | ICD-10-CM | POA: Diagnosis not present

## 2024-01-27 DIAGNOSIS — R1111 Vomiting without nausea: Secondary | ICD-10-CM | POA: Diagnosis not present

## 2024-01-27 DIAGNOSIS — R404 Transient alteration of awareness: Secondary | ICD-10-CM | POA: Diagnosis not present

## 2024-01-27 LAB — RAPID URINE DRUG SCREEN, HOSP PERFORMED
Amphetamines: NOT DETECTED
Barbiturates: NOT DETECTED
Benzodiazepines: NOT DETECTED
Cocaine: NOT DETECTED
Opiates: NOT DETECTED
Tetrahydrocannabinol: NOT DETECTED

## 2024-01-27 LAB — COMPREHENSIVE METABOLIC PANEL WITH GFR
ALT: 19 U/L (ref 0–44)
AST: 22 U/L (ref 15–41)
Albumin: 4.2 g/dL (ref 3.5–5.0)
Alkaline Phosphatase: 74 U/L (ref 38–126)
Anion gap: 15 (ref 5–15)
BUN: 9 mg/dL (ref 6–20)
CO2: 22 mmol/L (ref 22–32)
Calcium: 9.4 mg/dL (ref 8.9–10.3)
Chloride: 106 mmol/L (ref 98–111)
Creatinine, Ser: 0.79 mg/dL (ref 0.44–1.00)
GFR, Estimated: >60 mL/min (ref >60)
Glucose, Bld: 101 mg/dL — ABNORMAL HIGH (ref 70–99)
Potassium: 3.8 mmol/L (ref 3.5–5.1)
Sodium: 143 mmol/L (ref 135–145)
Total Bilirubin: 0.4 mg/dL (ref 0.0–1.2)
Total Protein: 7.2 g/dL (ref 6.5–8.1)

## 2024-01-27 LAB — CBC WITH DIFFERENTIAL/PLATELET
Abs Immature Granulocytes: 0.02 10*3/uL (ref 0.00–0.07)
Basophils Absolute: 0.1 10*3/uL (ref 0.0–0.1)
Basophils Relative: 1 %
Eosinophils Absolute: 0.2 10*3/uL (ref 0.0–0.5)
Eosinophils Relative: 3 %
HCT: 38.9 % (ref 36.0–46.0)
Hemoglobin: 12.5 g/dL (ref 12.0–15.0)
Immature Granulocytes: 0 %
Lymphocytes Relative: 22 %
Lymphs Abs: 1.4 10*3/uL (ref 0.7–4.0)
MCH: 27.9 pg (ref 26.0–34.0)
MCHC: 32.1 g/dL (ref 30.0–36.0)
MCV: 86.8 fL (ref 80.0–100.0)
Monocytes Absolute: 0.2 10*3/uL (ref 0.1–1.0)
Monocytes Relative: 3 %
Neutro Abs: 4.6 10*3/uL (ref 1.7–7.7)
Neutrophils Relative %: 71 %
Platelets: 273 10*3/uL (ref 150–400)
RBC: 4.48 MIL/uL (ref 3.87–5.11)
RDW: 12.5 % (ref 11.5–15.5)
WBC: 6.4 10*3/uL (ref 4.0–10.5)
nRBC: 0 % (ref 0.0–0.2)

## 2024-01-27 LAB — URINALYSIS, ROUTINE W REFLEX MICROSCOPIC
Bilirubin Urine: NEGATIVE
Glucose, UA: NEGATIVE mg/dL
Hgb urine dipstick: NEGATIVE
Ketones, ur: NEGATIVE mg/dL
Leukocytes,Ua: NEGATIVE
Nitrite: NEGATIVE
Protein, ur: NEGATIVE mg/dL
Specific Gravity, Urine: 1.016 (ref 1.005–1.030)
pH: 7 (ref 5.0–8.0)

## 2024-01-27 LAB — HEPATITIS C ANTIBODY: HCV Ab: NONREACTIVE

## 2024-01-27 LAB — RAPID HIV SCREEN (HIV 1/2 AB+AG)
HIV 1/2 Antibodies: NONREACTIVE
HIV-1 P24 Antigen - HIV24: NONREACTIVE

## 2024-01-27 LAB — CBG MONITORING, ED: Glucose-Capillary: 102 mg/dL — ABNORMAL HIGH (ref 70–99)

## 2024-01-27 LAB — HCG, SERUM, QUALITATIVE: Preg, Serum: NEGATIVE

## 2024-01-27 LAB — HEPATITIS B SURFACE ANTIGEN: Hepatitis B Surface Ag: NONREACTIVE

## 2024-01-27 LAB — ETHANOL: Alcohol, Ethyl (B): 173 mg/dL — ABNORMAL HIGH (ref <15)

## 2024-01-27 MED ORDER — CEFTRIAXONE SODIUM 500 MG IJ SOLR: 500.0000 mg | Freq: Once | INTRAMUSCULAR | Status: DC

## 2024-01-27 MED ORDER — AZITHROMYCIN 250 MG PO TABS: 1000.0000 mg | ORAL_TABLET | Freq: Once | ORAL | Status: DC

## 2024-01-27 MED ORDER — HYDROCODONE-ACETAMINOPHEN 5-325 MG PO TABS
1.0000 | ORAL_TABLET | Freq: Once | ORAL | Status: AC
  Administered 2024-01-27: 1 via ORAL
  Filled 2024-01-27: qty 1

## 2024-01-27 MED ORDER — ULIPRISTAL ACETATE 30 MG PO TABS
30.0000 mg | ORAL_TABLET | Freq: Once | ORAL | Status: AC
  Administered 2024-01-27: 30 mg via ORAL
  Filled 2024-01-27: qty 1

## 2024-01-27 MED ORDER — NALOXONE HCL 4 MG/0.1ML NA LIQD
1.0000 | Freq: Once | NASAL | Status: DC
  Filled 2024-01-27: qty 4

## 2024-01-27 MED ORDER — BICTEGRAVIR-EMTRICITAB-TENOFOV 50-200-25 MG PO PREPACK
1.0000 | ORAL_TABLET | Freq: Every day | ORAL | Status: DC
  Administered 2024-01-27: 1 via ORAL
  Filled 2024-01-27 (×2): qty 1

## 2024-01-27 MED ORDER — ACETAMINOPHEN 325 MG PO TABS
650.0000 mg | ORAL_TABLET | Freq: Once | ORAL | Status: AC
  Administered 2024-01-27: 650 mg via ORAL
  Filled 2024-01-27: qty 2

## 2024-01-27 MED ORDER — METRONIDAZOLE 500 MG PO TABS: 2000.0000 mg | ORAL_TABLET | Freq: Once | ORAL | Status: DC

## 2024-01-27 MED ORDER — NALOXONE HCL 0.4 MG/ML IJ SOLN
0.4000 mg | Freq: Once | INTRAMUSCULAR | Status: AC
  Administered 2024-01-27: 0.4 mg via NASAL

## 2024-01-27 MED ORDER — PROMETHAZINE HCL 25 MG PO TABS
25.0000 mg | ORAL_TABLET | Freq: Four times a day (QID) | ORAL | Status: DC | PRN
  Administered 2024-01-27: 25 mg via ORAL

## 2024-01-27 MED ORDER — SODIUM CHLORIDE 0.9 % IV BOLUS
1000.0000 mL | Freq: Once | INTRAVENOUS | Status: AC
  Administered 2024-01-27: 1000 mL via INTRAVENOUS

## 2024-01-27 MED ORDER — SODIUM CHLORIDE 0.9 % IV SOLN
1.0000 g | Freq: Once | INTRAVENOUS | Status: AC
  Administered 2024-01-27: 1 g via INTRAVENOUS
  Filled 2024-01-27: qty 10

## 2024-01-27 MED ORDER — LIDOCAINE HCL (PF) 1 % IJ SOLN: 1.0000 mL | Freq: Once | INTRAMUSCULAR | Status: DC

## 2024-01-27 MED ORDER — BICTEGRAVIR-EMTRICITAB-TENOFOV 50-200-25 MG PO TABS
1.0000 | ORAL_TABLET | Freq: Every day | ORAL | 0 refills | Status: AC
  Filled 2024-01-27 – 2024-01-31 (×2): qty 30, 30d supply, fill #0

## 2024-01-27 NOTE — ED Notes (Signed)
 PA at bedside.

## 2024-01-27 NOTE — SANE Note (Signed)
 [employer] copay card:  RxBIN: [id number] RxPCN: [id number] RxGRP: [id number] ISSUER: [phone]) ID: [id number]
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
## 2024-01-27 NOTE — ED Notes (Signed)
SANE RN at bedside.

## 2024-01-27 NOTE — ED Notes (Signed)
 PA notified of assessment findings by RN

## 2024-01-27 NOTE — ED Notes (Addendum)
 PA at bedside.

## 2024-01-27 NOTE — ED Notes (Signed)
 Mother updated on current plan of treatment after patient's consent to disclose information
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
## 2024-01-27 NOTE — ED Notes (Signed)
 SANE nurse at bedside.
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
## 2024-01-27 NOTE — Discharge Instructions (Addendum)
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 pain, loss of appetite, nausea, light-colored stool, dark yellow or brown urine, yellowing skin or eyes, unusual weakness or fatigue Pain, tingling, or numbness in the hands or feet Redness, blistering, peeling, or loosening of the skin, including inside the mouth Seizures Severe diarrhea, fever Sudden eye pain or change in vision such as blurry vision, seeing halos around lights, vision loss Unusual vaginal discharge, itching, or odor Side effects that  usually do not require medical attention (report these to your care team if they continue or are bothersome): Diarrhea Metallic taste in mouth Nausea Stomach pain  This list may not describe all possible side effects. Call your doctor for medical advice about side effects. You may report side effects to FDA at 1-800-FDA-1088.  Where should I keep my medication? Keep out of the reach of children and pets. Store between 15 and 25 degrees C (59 and 77 degrees F). Protect from light. Get rid of any  unused medication after the expiration date. To get rid of medications that are no longer needed or have expired: Take the medication to a medication take-back program. Check with your pharmacy or law enforcement to find a location. If you cannot return the medication, check the label or package insert to see if the medication should be thrown out in the garbage or flushed down the toilet. If you are not sure, ask your care team. If it is safe to put it in the trash, take the medication out of the container. Mix the medication with cat litter, dirt, coffee grounds, or other unwanted substance. Seal the mixture in a bag or container. Put it in the trash.  NOTE: This sheet is a summary. It may not cover all possible information. If you have questions about this medicine, talk to your doctor, pharmacist, or health care provider.  2024 Elsevier/Gold Standard (2022-08-04 00:00:00) Ceftriaxone Injection  What is this medication? CEFTRIAXONE (sef try AX one) treats infections caused by bacteria. It belongs to a group of medications called cephalosporin antibiotics. It will not treat colds, the flu, or infections caused by viruses. This medicine may be used for other purposes; ask your health care provider or pharmacist if you have questions. COMMON BRAND NAME(S): Ceftri-IM, Ceftrisol Plus, Rocephin  What should I tell my care team before I take this medication? They need to know if you have any of these conditions: Bleeding disorder High bilirubin level in newborn patients Kidney disease Liver disease Poor nutrition An unusual or allergic reaction to ceftriaxone, other penicillin or cephalosporin antibiotics, other medications, foods, dyes, or preservatives Pregnant or trying to get pregnant Breast-feeding  How should I use this medication? This medication is injected into a vein or a muscle. It is usually given by your care team in a hospital or clinic setting. It may also be given at home. If  you get this medication at home, you will be taught how to prepare and give it. Use exactly as directed. Take it as directed on the prescription label at the same time every day. Keep taking it even if you think you are better. It is important that you put your used needles and syringes in a special sharps container. Do not put them in a trash can. If you do not have a sharps container, call your pharmacist or care team to get one. Talk to your care team about the use of this medication in children. While it may be prescribed for children as young as newborns for selected conditions, precautions do apply. Overdosage: If you think you have taken too much of this medicine contact a poison control center or emergency room at once. NOTE: This medicine is only for you. Do not share this medicine with others.  What if I miss a dose? If you get this medication at the hospital or clinic: It is important not to miss your dose. Call your care team if you are unable to keep an  appointment. If you give yourself this medication at home: If you miss a dose, take it as soon as you can. Then continue your normal schedule. If it is almost time for your next dose, take only that dose. Do not take double or extra doses. Call your care team with questions. What may interact with this medication? Estrogen or progestin hormones Intravenous calcium This list may not describe all possible interactions. Give your health care provider a list of all the medicines, herbs, non-prescription drugs, or dietary supplements you use. Also tell them if you smoke, drink alcohol, or use illegal drugs. Some items may interact with your medicine.  What should I watch for while using this medication? Tell your care team if your symptoms do not start to get better or if they get worse. Do not treat diarrhea with over the counter products. Contact your care team if you have diarrhea that lasts more than 2 days or if it is severe and  watery. If you have diabetes, you may get a false-positive result for sugar in your urine. Check with your care team. If you are being treated for a sexually transmitted infection (STI), avoid sexual contact until you have finished your treatment. Your partner may also need treatment.  What side effects may I notice from receiving this medication? Side effects that you should report to your care team as soon as possible: Allergic reactions--skin rash, itching, hives, swelling of the face, lips, tongue, or throat Hemolytic anemia--unusual weakness or fatigue, dizziness, headache, trouble breathing, dark urine, yellowing skin or eyes Severe diarrhea, fever Unusual vaginal discharge, itching, or odor Side effects that usually do not require medical attention (report to your care team if they continue or are bothersome): Diarrhea Headache Nausea Pain, redness, or irritation at injection site  This list may not describe all possible side effects. Call your doctor for medical advice about side effects. You may report side effects to FDA at 1-800-FDA-1088.  Where should I keep my medication? Keep out of the reach of children and pets. You will be instructed on how to store this medication. Get rid of any unused medication after the expiration date. To get rid of medications that are no longer needed or have expired: Take the medication to a medication take-back program. Check with your pharmacy or law enforcement to find a location. If you cannot return the medication, ask your pharmacist or care team how to get rid of this medication safely.  NOTE: This sheet is a summary. It may not cover all possible information. If you have questions about this medicine, talk to your doctor, pharmacist, or health care provider.  2024 Elsevier/Gold Standard (2021-09-22 00:00:00) Ulipristal Tablets  What is this medication? ULIPRISTAL (UE li pris tal) can prevent pregnancy. It should be taken as soon as  possible in the 5 days (120 hours) after unprotected sex or if you think your contraceptive didn't work. It belongs to a group of medications called emergency contraceptives. It does not prevent HIV or other sexually transmitted infections (STIs). This medicine may be used for other purposes; ask your health care provider or pharmacist if you have questions. COMMON BRAND NAME(S): ella  What should I tell my care team before I take this medication? They need to know if you have any of these conditions: Liver disease An unusual or allergic reaction to ulipristal, other medications, foods, dyes, or preservatives Pregnant or trying to get pregnant Breastfeeding  How should I use this medication? Take this medication  by mouth with or without food. Your care team may want you to use a quick-response pregnancy test prior to using the tablets. Take your medication as soon as possible and not more than 5 days (120 hours) after the event. This medication can be taken at any time during your menstrual cycle. Follow the dose instructions of your care team exactly. Contact your care team right away if you vomit within 3 hours of taking your medication to discuss if you need to take another tablet. A patient package insert for the product will be given with each prescription and refill. Be sure to read this information carefully each time. The sheet may change often. Contact your care team about the use of this medication in children. Special care may be needed. Overdosage: If you think you have taken too much of this medicine contact a poison control center or emergency room at once. NOTE: This medicine is only for you. Do not share this medicine with others.  What if I miss a dose? This medication is not for regular use. If you vomit within 3 hours of taking your dose, contact your care team for instructions.  What may interact with this medication? This medication may interact with the  following: Barbiturates, such as phenobarbital or primidone Bosentan Carbamazepine Certain antivirals for HIV or hepatitis Certain medications for fungal infections, such as griseofulvin, itraconazole, ketoconazole Dabigatran Digoxin Estrogen or progestin hormones Felbamate Fexofenadine Oxcarbazepine Phenytoin  Rifampin St. John's wort Topiramate  This list may not describe all possible interactions. Give your health care provider a list of all the medicines, herbs, non-prescription drugs, or dietary supplements you use. Also tell them if you smoke, drink alcohol, or use illegal drugs. Some items may interact with your medicine.  What should I watch for while using this medication? Your period may begin a few days earlier or later than expected. If your period is more than 7 days late, pregnancy is possible. See your care team as soon as you can and get a pregnancy test. Talk to your care team before taking this medication if you know or suspect that you are pregnant. Contact your care team if you think you may be pregnant and have taken this medication. If you have severe abdominal pain about 3 to 5 weeks after taking this medication you may have a pregnancy outside the womb, which is called an ectopic or tubal pregnancy. Call your care team or go to the nearest emergency room right away if you think this is happening. Talk to your care team about reliable forms of contraception. Emergency contraception is not to be used routinely to prevent pregnancy. It should not be used more than once in the same menstrual cycle. Estrogen and progestin hormones may not work as well while you are taking this medication. Wait at least 5 days after taking this medication to start or continue estrogen or progestin contraceptive medications. Also, a barrier contraceptive, such as a condom or diaphragm, is recommended between the time you take this medication and until your next menstrual period.  What side  effects may I notice from receiving this medication? Side effects that you should report to your care team as soon as possible: Allergic reactions--skin rash, itching, hives, swelling of the face, lips, tongue, or throat Side effects that usually do not require medical attention (report to your care team if they continue or are bothersome): Dizziness Fatigue Headache Irregular menstrual cycles or spotting Menstrual cramps Nausea Stomach pain  This list may not  describe all possible side effects. Call your doctor for medical advice about side effects. You may report side effects to FDA at 1-800-FDA-1088.  Where should I keep my medication? Keep out of the reach of children and pets. Store at room temperature between 20 and 25 degrees C (68 and 77 degrees F). Protect from light. Keep in the blister card inside the original box until you are ready to take it. Get rid of any unused medication after the expiration date. To get rid of medications that are no longer needed or have expired: Take the medication to a medication take-back program. Check with your pharmacy or law enforcement to find a location. If you cannot return the medication, ask your pharmacist or care team how to get rid of this medication safely.  NOTE: This sheet is a summary. It may not cover all possible information. If you have questions about this medicine, talk to your doctor, pharmacist, or health care provider.  2024 Elsevier/Gold Standard (2022-01-29 00:00:00)

## 2024-01-27 NOTE — ED Provider Notes (Signed)
 [place] EMERGENCY DEPARTMENT AT [facility] Provider Note   CSN: [id number] Arrival date & time: 01/27/24  0451     Patient presents with: No chief complaint on file.   Alisha Reynolds is a 28 y.o. female.   Patient with history of anxiety presents today with concerns for altered mental status. Patient does not speak, no family present therefore history very limited. According to EMS, patient reportedly intoxicated, found to be unresponsive. While in the ambulance she spontaneously regained consciousness and was frantic and yelling, pulling off her EKG leads and then began unresponsive again.   Level 5 caveat -- altered mental status  The history is provided by the patient and the EMS personnel. No language interpreter was used.       Prior to Admission medications   Medication Sig Start Date End Date Taking? Authorizing Provider  albuterol  (VENTOLIN  HFA) 108 (90 Base) MCG/ACT inhaler Inhale 2 puffs into the lungs every 6 (six) hours as needed for wheezing or shortness of breath. 09/24/22   Pokhrel, Vernal, MD  Elagolix Sodium  (ORILISSA ) 150 MG TABS Take 1 tablet (150 mg total) by mouth daily. 10/21/23     fluticasone  (FLONASE ) 50 MCG/ACT nasal spray Place 1 spray into both nostrils daily. 07/29/23   Janet Lonni BRAVO, MD  gabapentin  (NEURONTIN ) 100 MG capsule Take 1 capsule (100 mg total) by mouth 3 (three) times daily as needed. 01/22/24   Theadore Ozell HERO, MD  ondansetron  (ZOFRAN ) 4 MG tablet Take 1 tablet (4 mg total) by mouth every 6 (six) hours as needed for nausea or vomiting. 01/10/23   Cindy Garnette POUR, MD  propranolol  (INDERAL ) 10 MG tablet Take 1 tablet (10 mg total) by mouth at bedtime. AND PRN fOR SUSTAINED PALP > must be 6-8 hours between doses 11/04/23   Emelia Josefa HERO, NP  rizatriptan  (MAXALT -MLT) 10 MG disintegrating tablet Take 1 tablet (10 mg total) by mouth as needed for migraine. May repeat in 2 hours if needed 12/10/22   Camara, Amadou, MD  tretinoin  (RETIN-A) 0.05 % cream Apply topically at bedtime. 06/03/23   [provider]  valACYclovir  (VALTREX ) 1000 MG tablet Take 1,000 mg by mouth daily. 09/14/22   [provider]    Allergies: Other, Peanut-containing drug products, Oxycodone , Tramadol , and Pork-derived products    Review of Systems  Unable to perform ROS: Patient unresponsive    Updated Vital Signs BP 121/78   Pulse (!) 122   Temp 98.4 F (36.9 C) (Oral)   Resp 20   Ht 5' 1 (1.549 m)   Wt 62.6 kg   LMP 01/03/2024   SpO2 100%   BMI 26.08 kg/m   Physical Exam Vitals and nursing note reviewed.  Constitutional:      General: She is not in acute distress.    Appearance: Normal appearance. She is normal weight. She is not ill-appearing, toxic-appearing or diaphoretic.  HENT:     Head: Normocephalic and atraumatic.  Eyes:     Extraocular Movements: Extraocular movements intact.     Pupils: Pupils are equal, round, and reactive to light.  Cardiovascular:     Rate and Rhythm: Normal rate and regular rhythm.     Heart sounds: Normal heart sounds.  Pulmonary:     Effort: Pulmonary effort is normal. No respiratory distress.     Breath sounds: Normal breath sounds.     Comments: Protecting her airway Abdominal:     General: Abdomen is flat.  Palpations: Abdomen is soft.     Tenderness: There is no abdominal tenderness.  Musculoskeletal:        General: Normal range of motion.     Cervical back: Normal range of motion.  Skin:    General: Skin is warm and dry.  Neurological:     General: No focal deficit present.     Mental Status: She is alert.     Comments: Patient protects her face with arm drop, will groan with painful stimuli, does not respond to verbal stimuli.   Psychiatric:        Mood and Affect: Mood normal.        Behavior: Behavior normal.     (all labs ordered are listed, but only abnormal results are displayed) Labs Reviewed  COMPREHENSIVE METABOLIC PANEL WITH GFR -  Abnormal; Notable for the following components:      Result Value   Glucose, Bld 101 (*)    All other components within normal limits  ETHANOL - Abnormal; Notable for the following components:   Alcohol, Ethyl (B) 173 (*)    All other components within normal limits  CBG MONITORING, ED - Abnormal; Notable for the following components:   Glucose-Capillary 102 (*)    All other components within normal limits  CBC WITH DIFFERENTIAL/PLATELET  HCG, SERUM, QUALITATIVE  URINALYSIS, ROUTINE W REFLEX MICROSCOPIC  RAPID URINE DRUG SCREEN, HOSP PERFORMED    EKG: EKG Interpretation Date/Time:  Thursday January 27 2024 04:59:02 EDT Ventricular Rate:  123 PR Interval:  102 QRS Duration:  102 QT Interval:  439 QTC Calculation: 629 R Axis:   58  Text Interpretation: Sinus tachycardia Nonspecific T abnormalities, lateral leads Prolonged QT interval Confirmed by Midge Golas ([id number]) on 01/27/2024 5:37:30 AM  Radiology: No results found.   Procedures   Medications Ordered in the ED  sodium chloride  0.9 % bolus 1,000 mL (1,000 mLs Intravenous New Bag/Given 01/27/24 0618)                                    Medical Decision Making Amount and/or Complexity of Data Reviewed Labs: ordered. Radiology: ordered.   This patient is a 28 y.o. female who presents to the ED for concern of altered mental status, this involves an extensive number of treatment options, and is a complaint that carries with it a high risk of complications and morbidity. The emergent differential diagnosis prior to evaluation includes, but is not limited to,  Drug-related, hypoxia, hyper/hypoglycemia, encephalopathy, sepsis, DKA/HHS, brain lesion, CVA, seizure, environmental, psychiatric  This is not an exhaustive differential.   Past Medical History / Co-morbidities / Social History:  has a past medical history of Anemia (10/30/2011), Asthma, Dysmenorrhea (10/30/2011), and Thyroid  nodule.  Additional history: Chart  reviewed. Pertinent results include: looks like patient came in 2023 with similar complaints, notes that a year prior there have been concerns for seizure-like activity, had normal CT head and EEG, was diagnosed with psychogenic nonepileptic event. Patient improved after time and went home  Physical Exam: Physical exam performed. The pertinent findings include: Patient protects her face with arm drop, will groan with painful stimuli, does not respond to verbal stimuli. PEERLA and EOMs intact  Lab Tests: I ordered, and personally interpreted labs.  The pertinent results include:  Ethanol 173, UA pending. No other acute laboratory abnormalities   Imaging Studies: I ordered imaging studies including CT head. I independently visualized and  interpreted imaging which showed no acute findings. I agree with the radiologist interpretation.   Cardiac Monitoring:  The patient was maintained on a cardiac monitor.  My attending physician Dr. Midge viewed and interpreted the cardiac monitored which showed an underlying rhythm of: sinus tachycardia. I agree with this interpretation.   Medications: I ordered medication including fluids  for tachycardia. Reevaluation of the patient after these medicines showed that the patient improved. I have reviewed the patients home medicines and have made adjustments as needed.   Disposition:  Patients UA and UDS pending at shift change. Patient likely needs to metabolize to freedom from her alcohol use. Work-up is overall benign  Care handoff to Darice Showers, PA-C at shift change. Please see their note for further information.   This is a shared visit with supervising physician Dr. Midge who has independently evaluated patient & provided guidance in evaluation/management/disposition, in agreement with care   Final diagnoses:  None    ED Discharge Orders     None          Alisha Reynolds 01/27/24 0700    Midge Golas, MD 01/28/24 [phone]

## 2024-01-27 NOTE — ED Notes (Signed)
 Pt discharged. Pt given discharge papers and papers explained. Pt in NAD at this time. Pt given paper scrubs to change into
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
## 2024-01-27 NOTE — ED Notes (Signed)
 Patient ambulated to bathroom with assistance by RN. Patient changed into gown.

## 2024-01-27 NOTE — Consult Note (Signed)
 1345 I spoke with patient and presented her options verbally and in writing. Made a copy of these options for patient to keep. Patient is thinking about full medicolegal services but wants to talk to her mother first. I also provided [facility] brochure. Updated RN and PA.
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
## 2024-01-27 NOTE — ED Notes (Signed)
 PA encouraged for urine collection. PA instructed to collect sample without clean catch process. Sample collected. SANE nurse notified.

## 2024-01-28 LAB — RPR: RPR Ser Ql: NONREACTIVE

## 2024-01-31 ENCOUNTER — Other Ambulatory Visit: Payer: Self-pay

## 2024-01-31 ENCOUNTER — Other Ambulatory Visit (HOSPITAL_COMMUNITY): Payer: Self-pay

## 2024-01-31 DIAGNOSIS — Z79899 Other long term (current) drug therapy: Secondary | ICD-10-CM | POA: Diagnosis not present

## 2024-01-31 DIAGNOSIS — R41840 Attention and concentration deficit: Secondary | ICD-10-CM | POA: Diagnosis not present

## 2024-02-01 ENCOUNTER — Other Ambulatory Visit: Payer: Self-pay

## 2024-02-04 ENCOUNTER — Telehealth: Payer: Self-pay

## 2024-02-04 ENCOUNTER — Other Ambulatory Visit (HOSPITAL_COMMUNITY): Payer: Self-pay

## 2024-02-04 NOTE — Telephone Encounter (Signed)
 Pharmacy Patient Advocate Encounter  Insurance verification completed.   The patient is insured through [facility]   Ran test claim for Truvada. Currently a quantity of 30 is a 30 day supply and the co-pay is $15.00 . Apretude $0.00  This test claim was processed through [facility]- copay amounts may vary at other pharmacies due to pharmacy/plan contracts, or as the patient moves through the different stages of their insurance plan.

## 2024-02-07 ENCOUNTER — Other Ambulatory Visit: Payer: Self-pay

## 2024-02-09 DIAGNOSIS — F439 Reaction to severe stress, unspecified: Secondary | ICD-10-CM | POA: Diagnosis not present

## 2024-02-09 DIAGNOSIS — G479 Sleep disorder, unspecified: Secondary | ICD-10-CM | POA: Diagnosis not present

## 2024-02-10 ENCOUNTER — Ambulatory Visit: Admitting: Family

## 2024-02-16 ENCOUNTER — Ambulatory Visit (INDEPENDENT_AMBULATORY_CARE_PROVIDER_SITE_OTHER): Admitting: Infectious Diseases

## 2024-02-16 ENCOUNTER — Encounter: Payer: Self-pay | Admitting: Infectious Diseases

## 2024-02-16 ENCOUNTER — Other Ambulatory Visit: Payer: Self-pay

## 2024-02-16 VITALS — BP 124/83 | HR 81 | Temp 98.2°F | Ht 61.0 in | Wt 147.0 lb

## 2024-02-16 DIAGNOSIS — Z202 Contact with and (suspected) exposure to infections with a predominantly sexual mode of transmission: Secondary | ICD-10-CM

## 2024-02-16 NOTE — Patient Instructions (Signed)
 You are here for Post exposure prophylaxis- continue to take Biktarvy  and day 30 after we will do repeat labs- they will be placed in the epic and you can do it 8/4 or after
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
## 2024-02-23 DIAGNOSIS — Z79899 Other long term (current) drug therapy: Secondary | ICD-10-CM | POA: Diagnosis not present

## 2024-02-23 DIAGNOSIS — Z885 Allergy status to narcotic agent status: Secondary | ICD-10-CM | POA: Diagnosis not present

## 2024-02-23 DIAGNOSIS — D649 Anemia, unspecified: Secondary | ICD-10-CM | POA: Diagnosis not present

## 2024-02-23 DIAGNOSIS — F4310 Post-traumatic stress disorder, unspecified: Secondary | ICD-10-CM | POA: Diagnosis not present

## 2024-02-23 DIAGNOSIS — R569 Unspecified convulsions: Secondary | ICD-10-CM | POA: Diagnosis not present

## 2024-02-23 DIAGNOSIS — R780 Finding of alcohol in blood: Secondary | ICD-10-CM | POA: Diagnosis not present

## 2024-02-23 DIAGNOSIS — T7421XA Adult sexual abuse, confirmed, initial encounter: Secondary | ICD-10-CM | POA: Diagnosis not present

## 2024-02-23 DIAGNOSIS — R0902 Hypoxemia: Secondary | ICD-10-CM | POA: Diagnosis not present

## 2024-02-23 DIAGNOSIS — R410 Disorientation, unspecified: Secondary | ICD-10-CM | POA: Diagnosis not present

## 2024-02-23 DIAGNOSIS — G40909 Epilepsy, unspecified, not intractable, without status epilepticus: Secondary | ICD-10-CM | POA: Diagnosis not present

## 2024-02-23 DIAGNOSIS — R000 Tachycardia, unspecified: Secondary | ICD-10-CM | POA: Diagnosis not present

## 2024-02-23 DIAGNOSIS — F418 Other specified anxiety disorders: Secondary | ICD-10-CM | POA: Diagnosis not present

## 2024-02-23 DIAGNOSIS — R0689 Other abnormalities of breathing: Secondary | ICD-10-CM | POA: Diagnosis not present

## 2024-02-23 DIAGNOSIS — F10120 Alcohol abuse with intoxication, uncomplicated: Secondary | ICD-10-CM | POA: Diagnosis not present

## 2024-03-01 NOTE — Addendum Note (Signed)
 Addended by: VEVA MOTTS T on: 03/01/2024 11:10 AM   Modules accepted: Orders

## 2024-03-22 ENCOUNTER — Ambulatory Visit: Admitting: Infectious Diseases

## 2024-06-12 ENCOUNTER — Telehealth: Payer: Self-pay | Admitting: Neurology

## 2024-06-12 NOTE — Telephone Encounter (Signed)
 Request to reschedule appointment due to conflict with other appointment

## 2024-06-13 ENCOUNTER — Ambulatory Visit: Payer: 59 | Admitting: Neurology

## 2024-12-26 ENCOUNTER — Ambulatory Visit: Admitting: Neurology

## 2025-01-11 ENCOUNTER — Ambulatory Visit: Admitting: Neurology
